# Patient Record
Sex: Female | Born: 1960
Health system: Southern US, Community
[De-identification: ages and names within clinical notes are randomized; demographics above are authoritative.]

## PROBLEM LIST (undated history)

## (undated) DIAGNOSIS — I10 Essential (primary) hypertension: Secondary | ICD-10-CM

## (undated) DIAGNOSIS — Z9889 Other specified postprocedural states: Secondary | ICD-10-CM

## (undated) DIAGNOSIS — D352 Benign neoplasm of pituitary gland: Secondary | ICD-10-CM

## (undated) DIAGNOSIS — R112 Nausea with vomiting, unspecified: Secondary | ICD-10-CM

## (undated) DIAGNOSIS — N979 Female infertility, unspecified: Secondary | ICD-10-CM

## (undated) DIAGNOSIS — Z85828 Personal history of other malignant neoplasm of skin: Secondary | ICD-10-CM

## (undated) DIAGNOSIS — E559 Vitamin D deficiency, unspecified: Secondary | ICD-10-CM

## (undated) DIAGNOSIS — T7840XA Allergy, unspecified, initial encounter: Secondary | ICD-10-CM

## (undated) HISTORY — DX: Benign neoplasm of pituitary gland: D35.2

## (undated) HISTORY — DX: Nausea with vomiting, unspecified: Z98.890

## (undated) HISTORY — PX: LAPAROSCOPY: SHX197

## (undated) HISTORY — DX: Allergy, unspecified, initial encounter: T78.40XA

## (undated) HISTORY — DX: Essential (primary) hypertension: I10

## (undated) HISTORY — DX: Personal history of other malignant neoplasm of skin: Z85.828

## (undated) HISTORY — PX: OTHER SURGICAL HISTORY: SHX169

## (undated) HISTORY — DX: Vitamin D deficiency, unspecified: E55.9

## (undated) HISTORY — PX: PLANTAR FASCIA SURGERY: SHX746

## (undated) HISTORY — DX: Other specified postprocedural states: R11.2

## (undated) HISTORY — PX: PLANTAR FASCIA RELEASE: SHX2239

## (undated) HISTORY — PX: COLONOSCOPY: SHX174

## (undated) HISTORY — DX: Female infertility, unspecified: N97.9

---

## 2013-11-17 ENCOUNTER — Emergency Department (HOSPITAL_BASED_OUTPATIENT_CLINIC_OR_DEPARTMENT_OTHER): Payer: Managed Care, Other (non HMO)

## 2013-11-17 ENCOUNTER — Encounter (HOSPITAL_BASED_OUTPATIENT_CLINIC_OR_DEPARTMENT_OTHER): Payer: Self-pay | Admitting: Emergency Medicine

## 2013-11-17 ENCOUNTER — Emergency Department (HOSPITAL_BASED_OUTPATIENT_CLINIC_OR_DEPARTMENT_OTHER)
Admission: EM | Admit: 2013-11-17 | Discharge: 2013-11-17 | Disposition: A | Discharge status: Home / Self Care | Payer: Managed Care, Other (non HMO) | Attending: Emergency Medicine | Admitting: Emergency Medicine

## 2013-11-17 DIAGNOSIS — Y9373 Activity, racquet and hand sports: Secondary | ICD-10-CM | POA: Insufficient documentation

## 2013-11-17 DIAGNOSIS — IMO0002 Reserved for concepts with insufficient information to code with codable children: Secondary | ICD-10-CM | POA: Diagnosis present

## 2013-11-17 DIAGNOSIS — Y9239 Other specified sports and athletic area as the place of occurrence of the external cause: Secondary | ICD-10-CM | POA: Diagnosis not present

## 2013-11-17 DIAGNOSIS — W010XXA Fall on same level from slipping, tripping and stumbling without subsequent striking against object, initial encounter: Secondary | ICD-10-CM | POA: Insufficient documentation

## 2013-11-17 DIAGNOSIS — Y92838 Other recreation area as the place of occurrence of the external cause: Secondary | ICD-10-CM

## 2013-11-17 DIAGNOSIS — S335XXA Sprain of ligaments of lumbar spine, initial encounter: Secondary | ICD-10-CM | POA: Insufficient documentation

## 2013-11-17 DIAGNOSIS — S39012A Strain of muscle, fascia and tendon of lower back, initial encounter: Secondary | ICD-10-CM

## 2013-11-17 MED ORDER — ONDANSETRON HCL 4 MG PO TABS: 4.0000 mg | ORAL_TABLET | Freq: Four times a day (QID) | ORAL | Status: DC

## 2013-11-17 MED ORDER — PREDNISONE 50 MG PO TABS
50.0000 mg | ORAL_TABLET | Freq: Every day | ORAL | Status: DC
  Administered 2013-11-17: 50 mg via ORAL
  Filled 2013-11-17: qty 1

## 2013-11-17 MED ORDER — DIAZEPAM 5 MG PO TABS: 5.0000 mg | ORAL_TABLET | Freq: Two times a day (BID) | ORAL | Status: DC

## 2013-11-17 MED ORDER — PREDNISONE 50 MG PO TABS: ORAL_TABLET | ORAL | Status: DC

## 2013-11-17 MED ORDER — ONDANSETRON 4 MG PO TBDP
4.0000 mg | ORAL_TABLET | Freq: Once | ORAL | Status: AC
  Administered 2013-11-17: 4 mg via ORAL
  Filled 2013-11-17: qty 1

## 2013-11-17 MED ORDER — DIAZEPAM 5 MG PO TABS
5.0000 mg | ORAL_TABLET | Freq: Once | ORAL | Status: AC
  Administered 2013-11-17: 5 mg via ORAL
  Filled 2013-11-17: qty 1

## 2013-11-17 MED ORDER — MORPHINE SULFATE 4 MG/ML IJ SOLN
4.0000 mg | Freq: Once | INTRAMUSCULAR | Status: AC
  Administered 2013-11-17: 4 mg via INTRAMUSCULAR
  Filled 2013-11-17: qty 1

## 2013-11-17 MED ORDER — OXYCODONE-ACETAMINOPHEN 5-325 MG PO TABS: ORAL_TABLET | ORAL | Status: DC

## 2013-11-17 NOTE — Discharge Instructions (Signed)
Please take ibuprofen 400mg  (this is normally 2 over the counter pills) every 6 hours (take with food to minimze stomach irritation).   Take valium and/or percocet for breakthrough pain, do not drink alcohol, drive, care for children or perfom other critical tasks while taking valium and/or percocet.  Do not hesitate to return to the emergency room for any new, worsening or concerning symptoms.  Please obtain primary care using resource guide below. But the minute you were seen in the emergency room and that they will need to obtain records for further outpatient management.   Back Pain, Adult Low back pain is very common. About 1 in 5 people have back pain.The cause of low back pain is rarely dangerous. The pain often gets better over time.About half of people with a sudden onset of back pain feel better in just 2 weeks. About 8 in 10 people feel better by 6 weeks.  CAUSES Some common causes of back pain include:  Strain of the muscles or ligaments supporting the spine.  Wear and tear (degeneration) of the spinal discs.  Arthritis.  Direct injury to the back. DIAGNOSIS Most of the time, the direct cause of low back pain is not known.However, back pain can be treated effectively even when the exact cause of the pain is unknown.Answering your caregiver's questions about your overall health and symptoms is one of the most accurate ways to make sure the cause of your pain is not dangerous. If your caregiver needs more information, he or she may order lab work or imaging tests (X-rays or MRIs).However, even if imaging tests show changes in your back, this usually does not require surgery. HOME CARE INSTRUCTIONS For many people, back pain returns.Since low back pain is rarely dangerous, it is often a condition that people can learn to [facility] their own.   Remain active. It is stressful on the back to sit or stand in one place. Do not sit, drive, or stand in one place for more than 30  minutes at a time. Take short walks on level surfaces as soon as pain allows.Try to increase the length of time you walk each day.  Do not stay in bed.Resting more than 1 or 2 days can delay your recovery.  Do not avoid exercise or work.Your body is made to move.It is not dangerous to be active, even though your back may hurt.Your back will likely heal faster if you return to being active before your pain is gone.  Pay attention to your body when you bend and lift. Many people have less discomfortwhen lifting if they bend their knees, keep the load close to their bodies,and avoid twisting. Often, the most comfortable positions are those that put less stress on your recovering back.  Find a comfortable position to sleep. Use a firm mattress and lie on your side with your knees slightly bent. If you lie on your back, put a pillow under your knees.  Only take over-the-counter or prescription medicines as directed by your caregiver. Over-the-counter medicines to reduce pain and inflammation are often the most helpful.Your caregiver may prescribe muscle relaxant drugs.These medicines help dull your pain so you can more quickly return to your normal activities and healthy exercise.  Put ice on the injured area.  Put ice in a plastic bag.  Place a towel between your skin and the bag.  Leave the ice on for 15-20 minutes, 03-04 times a day for the first 2 to 3 days. After that, ice and heat  may be alternated to reduce pain and spasms.  Ask your caregiver about trying back exercises and gentle massage. This may be of some benefit.  Avoid feeling anxious or stressed.Stress increases muscle tension and can worsen back pain.It is important to recognize when you are anxious or stressed and learn ways to manage it.Exercise is a great option. SEEK MEDICAL CARE IF:  You have pain that is not relieved with rest or medicine.  You have pain that does not improve in 1 week.  You have new  symptoms.  You are generally not feeling well. SEEK IMMEDIATE MEDICAL CARE IF:   You have pain that radiates from your back into your legs.  You develop new bowel or bladder control problems.  You have unusual weakness or numbness in your arms or legs.  You develop nausea or vomiting.  You develop abdominal pain.  You feel faint. Document Released: 02/08/2005 Document Revised: 08/10/2011 Document Reviewed: 06/12/2013 [facility] Patient Information 2015 [place], [place]. This information is not intended to replace advice given to you by your health care provider. Make sure you discuss any questions you have with your health care provider.  Emergency Department Resource Guide 1) Find a Doctor and Pay Out of Pocket Although you won't have to find out who is covered by your insurance plan, it is a good idea to ask around and get recommendations. You will then need to call the office and see if the doctor you have chosen will accept you as a new patient and what types of options they offer for patients who are self-pay. Some doctors offer discounts or will set up payment plans for their patients who do not have insurance, but you will need to ask so you aren't surprised when you get to your appointment.  2) Contact Your Local Health Department Not all health departments have doctors that can see patients for sick visits, but many do, so it is worth a call to see if yours does. If you don't know where your local health department is, you can check in your phone book. The CDC also has a tool to help you locate your state's health department, and many state websites also have listings of all of their local health departments.  3) Find a [facility] If your illness is not likely to be very severe or complicated, you may want to try a walk in clinic. These are popping up all over the country in pharmacies, drugstores, and shopping centers. They're usually staffed by nurse practitioners or physician  assistants that have been trained to treat common illnesses and complaints. They're usually fairly quick and inexpensive. However, if you have serious medical issues or chronic medical problems, these are probably not your best option.  No Primary Care Doctor: - Call Health Connect at  [phone] - they can help you locate a primary care doctor that  accepts your insurance, provides certain services, etc. - Physician Referral Service- [phone]  Chronic Pain Problems: Organization         Address  Phone   Notes  [facility]  [phone] Patients need to be referred by their primary care doctor.   Medication Assistance: Organization         Address  Phone   Notes  [facility] Medication [facility] [address] [place], [place] [address] [phone] --Must be a resident of [facility] -- Must have NO insurance coverage whatsoever (no Medicaid/ Medicare, etc.) -- The pt.  MUST have a primary care doctor that directs their care regularly and follows them in the community   MedAssist  [phone]   [employer]  [phone]    Agencies that provide inexpensive medical care: Organization         Address  Phone   Notes  [place]  [phone]   [facility]    [phone]   [facility] [place], [place] [address] [phone]   [place] [address] [place] [phone]   Planned Parenthood    [phone]   [facility]    [phone]   [place] and [place] [address], [place] Phone:  [phone], Fax:  [phone] Hours of Operation:  9 am - 6 pm, M-F.  Also accepts Medicaid/Medicare and self-pay.  [facility] for [place], [address], [place] Phone: [phone], Fax: [phone]. Hours of Operation:  8:30 am - 5:30 pm, M-F.  Also accepts  Medicaid and self-pay.  [facility] [place] [address] [place] Phone: [phone]   [place], [place], [place] [phone], Ext. [phone] Mondays & Thursdays: 7-9 AM.  First 15 patients are seen on a first come, first serve basis.    [place] Providers:  Organization         Address  Phone   Notes  [facility] [address], [place] [phone] Also accepts self-pay patients.  [facility] [address], [place]  [phone]   [place], [address], [place] [phone]   [facility] [address] [place] [phone]   Lucianne Lei [address], [place]   [phone] Only accepts [place] Access [place] patients after they have their name applied to their card.   Self-Pay (no insurance) in [facility]:  Organization         Address  Phone   Notes  Sickle Cell Patients, [facility] [place] [phone]   [facility] [place] [phone]   [facility] [place]  [place], [place],  [phone]   [facility]/Dr. Osei-Bonsu  [address] [place] or [address], [place] [phone] Phone number for both Central and [place] locations is the same.  Urgent Medical and [facility] [address], [place] [phone]   [facility] [address], [place] or [address] [phone]   [facility] [address] [place] [phone], phone; [phone], fax Sees patients 1st and 3rd Saturday of every month.  Must not qualify for public or private insurance (i.e. Medicaid, Medicare, [place] Health Choice, Veterans' Benefits)  Household  income should be no more than 200% of the poverty level The clinic cannot treat you if you are pregnant or think you are pregnant  Sexually transmitted diseases are not treated at the clinic.    Dental Care: Organization         Address  Phone  Notes  [facility] [address]  Mardene Speak [phone] Accepts children up to age 64 who are enrolled in Medicaid or [place]; pregnant women with a Medicaid card; and children who have applied for Medicaid or [place] Health Choice, but were declined, whose parents can pay a reduced fee at time of service.  [facility]  [address], [place] [phone] Accepts children up to age 12 who are enrolled in [place] or [place]; pregnant women with a Medicaid card; and children who have applied for Medicaid or [place] Health Choice, but were declined, whose parents can pay a reduced fee at time of service.  [place] Adult Dental Access PROGRAM  [place] [phone] Patients are seen by appointment only. Walk-ins are not accepted. [place] will see patients 6 years of age and older. Monday - Tuesday (8am-5pm) Most Wednesdays (8:30-5pm) $30 per visit, cash only  [place] Adult Dental Access PROGRAM  [address], [facility] [phone] Patients are seen by appointment only. Walk-ins are not accepted. [place] will see patients 107 years of age and older. One Wednesday Evening (Monthly: Volunteer Based).  $30 per visit, cash only  [place]  [phone] for adults; Children under age 32, call Graduate Pediatric Dentistry at [phone]. Children aged 76-14, please call [phone] to request a pediatric application.  Dental services are provided in all areas of dental care including fillings, crowns and bridges, complete and partial dentures, implants, gum  treatment, root canals, and extractions. Preventive care is also provided. Treatment is provided to both adults and children. Patients are selected via a lottery and there is often a waiting list.   [facility] [address] [place]  [phone] [email or web address]   [facility] [address] [place], [place] [phone], Ext. [phone] Second and Fourth Thursday of each month, opens at 6:30 AM; Clinic ends at 9 AM.  Patients are seen on a first-come first-served basis, and a limited number are seen during each clinic.   [facility]  [address] [place], [place] [phone]   Eligibility Requirements You must have lived in [place], [place], or [place] counties for at least the last three months.   You cannot be eligible for state or federal sponsored [employer], including [employer], [place], or [employer].   You generally cannot be eligible for healthcare insurance through your employer.    How to apply: Eligibility screenings are held every Tuesday and Wednesday afternoon from 1:00 pm until 4:00 pm. You do not need an appointment for the interview!  [facility] [address], [place], [place]   [place]  [place] Department  [place]  [phone]    Behavioral Health Resources in the Community: Intensive Outpatient Programs Organization         Address  Phone  Notes  [place]. [address] [place], [place] [phone]   [facility] Outpatient [address] [place], [place]   ADS: Alcohol & Drug Svcs [address], [place], [place]   [place] [address],  [place], [place] or [phone]   Substance Abuse Resources Organization         Address  Phone  Notes  Alcohol and  Drug  Services  [place]  [phone]   [facility] [place]   Chinita Pester  [phone]   Residential & Outpatient Substance Abuse Program  [phone]   Psychological Services Organization         Address  Phone  Notes  [place]  [place]  [phone]   [place] [address] [place] or [phone]    Mobile Crisis Teams Organization         Address  Phone  Notes  [facility], Mobile Crisis Care Unit  [phone]   [facility]  [address] [place], [place]   [place] Levels [address], [place] [phone]    Self-Help/Support Groups Organization         Address  Phone             Notes  [place]. of [place] - variety of support groups  [place] Call for more information  Narcotics Anonymous (NA), [facility] [address], [employer] [place]  2 meetings at this location   Special educational needs teacher         Address  Phone  Notes  [facility] [place],    [place]  [phone]   [facility]  [address] [place] [phone], [place], [place]   [place], [place] [phone] Admissions: 8am-3pm M-F  Incentives Substance [place] [address]    [place], [place] [phone]   [facility] [address] [place], [place], [place]   [facility] [address]  [place], [place]   [facility] - Intensive Outpatient [address], [place], [place]   [facility] ([place].,  [place], [place] [phone] or [phone]   [facility] Services (RTS) [address] [place], [place] Accepts Medicaid  Fellowship  [place] [address],  [place] [phone] Substance Abuse/Addiction Treatment   [facility] Organization         Address  Phone  Notes  [facility]  [phone]   Domenic Schwab, PhD [address] Arlis [place], [place]   [phone] or [phone]   [place], [place] [phone]   [facility] [address] [place], [place] [phone] Insurance/Medicaid/sponsorship through [facility] and Families [address], [address] [place]                                    [place], [place] [phone] [place] [address] [place], [place] [phone]    Dr. Adele Schilder  [phone]   [facility] Dept. 1) [address] [place] 2) [place] 3)  [address], [place] [phone]  [phone]   [place] [phone] or [phone] (After Hours)

## 2013-11-17 NOTE — ED Provider Notes (Signed)
CSN: [id number]     Arrival date & time 11/17/13  1149 History   First MD Initiated Contact with Patient 11/17/13 1205     Chief Complaint  Patient presents with  . Fall  . Back Pain     (Consider location/radiation/quality/duration/timing/severity/associated sxs/prior Treatment) HPI  Stacy Preston is a 53 y.o. female complaining of low back pain s/p slip and fall while on wooden racquetball court. Pain is severe 9/10, diffuse, non-radiating across the lower back and exacarbated by movement and palpation. No pain meds PTA, No h/o low back pain. Pt denies any head trauma/cervicalgia or other injuries in the fall. Denies fever, chills, change in bowel or bladder habits, h/o IDVU or cancer, numbness or weakness.    History reviewed. No pertinent past medical history. History reviewed. No pertinent past surgical history. No family history on file. History  Substance Use Topics  . Smoking status: Never Smoker   . Smokeless tobacco: Not on file  . Alcohol Use: Not on file   OB History   Grav Para Term Preterm Abortions TAB SAB Ect Mult Living                 Review of Systems  10 systems reviewed and found to be negative, except as noted in the HPI.   Allergies  Review of patient's allergies indicates no known allergies.  Home Medications   Prior to Admission medications   Medication Sig Start Date End Date Taking? Authorizing Provider  cholecalciferol (VITAMIN D) 1000 UNITS tablet Take 2,000 Units by mouth daily.   Yes Historical Provider, MD  diazepam (VALIUM) 5 MG tablet Take 1 tablet (5 mg total) by mouth 2 (two) times daily. 11/17/13   Lashana Spang, PA-C  ondansetron (ZOFRAN) 4 MG tablet Take 1 tablet (4 mg total) by mouth every 6 (six) hours. 11/17/13   Akaylah Lalley, PA-C  oxyCODONE-acetaminophen (PERCOCET/ROXICET) 5-325 MG per tablet 1 to 2 tabs PO q6hrs  PRN for pain 11/17/13   Elmyra Ricks Nitya Cauthon, PA-C  predniSONE (DELTASONE) 50 MG tablet Take 1 tablet daily with  breakfast 11/17/13   Gem Conkle, PA-C   BP 123/69  Pulse 70  Temp(Src) 98.7 F (37.1 C) (Oral)  Resp 18  Ht 5\' 3"  (1.6 m)  Wt 185 lb (83.915 kg)  BMI 32.78 kg/m2  SpO2 100% Physical Exam  Nursing note and vitals reviewed. Constitutional: She appears well-developed and well-nourished.  Tearful, standing and avoids movement  HENT:  Head: Normocephalic.  Eyes: Conjunctivae are normal.  Neck: Normal range of motion.  Cardiovascular: Normal rate, regular rhythm and intact distal pulses.   Pulmonary/Chest: Effort normal and breath sounds normal.  Abdominal: Soft. Bowel sounds are normal. There is no tenderness.  Neurological: She is alert.  No overlying skin changes or bruises, no coccyx or sacral pain. No point tenderness to percussion of lumbar spinal processes or sacrum.  Diffusely TTP on bilateral lumbar paraspinal musculature. Strength is 5 out of 5 to bilateral lower extremities at hip and knee; extensor hallucis longus 5 out of 5. Ankle strength 5 out of 5, no clonus, neurovascularly intact. No saddle anaesthesia. Patellar reflexes are 2+ bilaterally.   Ambulates with coordinated gate   Psychiatric: She has a normal mood and affect.    ED Course  Procedures (including critical care time) Labs Review Labs Reviewed - No data to display  Imaging Review Dg Lumbar Spine Complete  11/17/2013   CLINICAL DATA:  [address] backwards onto back playing racquetball, low back pain  EXAM: LUMBAR SPINE - COMPLETE 4+ VIEW  COMPARISON:  None  FINDINGS: Five non-rib-bearing lumbar vertebrae.  Disc space narrowing with endplate spur formation at L4-L5.  Additional scattered mild disc space narrowing at multiple levels.  Vertebral body heights maintained without fracture or subluxation.  No bone destruction or spondylolysis.  SI joints symmetric.  IMPRESSION: Scattered degenerative disc disease changes greatest at L4-L5.  No acute abnormalities.   Electronically Signed   By: Lavonia Dana M.D.   On:  11/17/2013 13:47     EKG Interpretation None      MDM   Final diagnoses:  Lumbar strain, initial encounter    Filed Vitals:   11/17/13 1153 11/17/13 1411  BP: 162/78 123/69  Pulse: 91 70  Temp: 98.7 F (37.1 C)   TempSrc: Oral   Resp: 18 18  Height: 5\' 3"  (1.6 m)   Weight: 185 lb (83.915 kg)   SpO2: 100% 100%    Medications  diazepam (VALIUM) tablet 5 mg (5 mg Oral Given 11/17/13 1237)  morphine 4 MG/ML injection 4 mg (4 mg Intramuscular Given 11/17/13 1235)  ondansetron (ZOFRAN-ODT) disintegrating tablet 4 mg (4 mg Oral Given 11/17/13 1245)    Stacy Preston is a 53 y.o. female presenting with low back pain s/p slip and fall on hardwood floor from standing height. No neuro deficits. X-ray negative. Pain well-controlled in the ED.  Evaluation does not show pathology that would require ongoing emergent intervention or inpatient treatment. Pt is hemodynamically stable and mentating appropriately. Discussed findings and plan with patient/guardian, who agrees with care plan. All questions answered. Return precautions discussed and outpatient follow up given.   Discharge Medication List as of 11/17/2013  1:52 PM    START taking these medications   Details  diazepam (VALIUM) 5 MG tablet Take 1 tablet (5 mg total) by mouth 2 (two) times daily., Starting 11/17/2013, Until Discontinued, Print    ondansetron (ZOFRAN) 4 MG tablet Take 1 tablet (4 mg total) by mouth every 6 (six) hours., Starting 11/17/2013, Until Discontinued, Print    oxyCODONE-acetaminophen (PERCOCET/ROXICET) 5-325 MG per tablet 1 to 2 tabs PO q6hrs  PRN for pain, Print    predniSONE (DELTASONE) 50 MG tablet Take 1 tablet daily with breakfast, Print             Monico Blitz, PA-C 11/18/13 0120

## 2013-11-17 NOTE — ED Notes (Signed)
Pt was playing racket ball when she fell backwards landing on her tailbone

## 2013-11-18 NOTE — ED Provider Notes (Signed)
Medical screening examination/treatment/procedure(s) were performed by non-physician practitioner and as supervising physician I was immediately available for consultation/collaboration.   EKG Interpretation None        Merryl Hacker, MD 11/18/13 [phone]

## 2013-11-26 LAB — HM PAP SMEAR: HM Pap smear: NEGATIVE

## 2014-02-28 ENCOUNTER — Other Ambulatory Visit: Payer: Self-pay | Admitting: Obstetrics & Gynecology

## 2014-02-28 DIAGNOSIS — N63 Unspecified lump in unspecified breast: Secondary | ICD-10-CM

## 2014-03-19 ENCOUNTER — Other Ambulatory Visit: Payer: Managed Care, Other (non HMO)

## 2014-05-02 ENCOUNTER — Ambulatory Visit
Admission: RE | Admit: 2014-05-02 | Discharge: 2014-05-02 | Disposition: A | Discharge status: Home / Self Care | Payer: Managed Care, Other (non HMO) | Source: Ambulatory Visit | Attending: Obstetrics & Gynecology | Admitting: Obstetrics & Gynecology

## 2014-05-02 ENCOUNTER — Other Ambulatory Visit: Payer: Self-pay

## 2014-05-02 DIAGNOSIS — Z1231 Encounter for screening mammogram for malignant neoplasm of breast: Secondary | ICD-10-CM

## 2014-05-02 DIAGNOSIS — N63 Unspecified lump in unspecified breast: Secondary | ICD-10-CM

## 2014-10-24 ENCOUNTER — Ambulatory Visit
Admission: RE | Admit: 2014-10-24 | Discharge: 2014-10-24 | Disposition: A | Discharge status: Home / Self Care | Payer: Managed Care, Other (non HMO) | Source: Ambulatory Visit

## 2014-10-24 DIAGNOSIS — Z1231 Encounter for screening mammogram for malignant neoplasm of breast: Secondary | ICD-10-CM

## 2015-03-06 LAB — HM COLONOSCOPY

## 2015-09-04 ENCOUNTER — Emergency Department
Admission: EM | Admit: 2015-09-04 | Discharge: 2015-09-04 | Disposition: A | Discharge status: Home / Self Care | Payer: Managed Care, Other (non HMO) | Source: Home / Self Care | Attending: Family Medicine | Admitting: Family Medicine

## 2015-09-04 ENCOUNTER — Encounter: Payer: Self-pay | Admitting: *Deleted

## 2015-09-04 ENCOUNTER — Emergency Department (INDEPENDENT_AMBULATORY_CARE_PROVIDER_SITE_OTHER): Payer: Managed Care, Other (non HMO)

## 2015-09-04 DIAGNOSIS — S5012XA Contusion of left forearm, initial encounter: Secondary | ICD-10-CM

## 2015-09-04 DIAGNOSIS — M79632 Pain in left forearm: Secondary | ICD-10-CM | POA: Diagnosis not present

## 2015-09-04 NOTE — ED Provider Notes (Signed)
CSN: [id number]     Arrival date & time 09/04/15  0840 History   First MD Initiated Contact with Patient 09/04/15 0911     Chief Complaint  Patient presents with  . Arm Injury      HPI Comments: Patient was preparing to walk her dog last night with a retractable leash in her left hand when her dog suddenly bolted.  Her left forearm then struck a door frame.  She has had persistent pain/swelling in her left forearm despite wearing a left wrist brace and icing.  She denies pain in her left wrist, elbow, or shoulder.  Patient is a 55 y.o. female presenting with arm injury. The history is provided by the patient.  Arm Injury Location:  Arm Time since incident:  1 day Injury: yes   Mechanism of injury comment:  Contusion by door frame Arm location:  L forearm Pain details:    Quality:  Aching   Radiates to:  Does not radiate   Severity:  Moderate   Onset quality:  Sudden   Duration:  1 day   Timing:  Constant   Progression:  Unchanged Chronicity:  New Handedness:  Right-handed Dislocation: no   Prior injury to area:  No Relieved by:  Nothing Worsened by:  Movement Ineffective treatments:  Ice Associated symptoms: stiffness and swelling   Associated symptoms: no decreased range of motion, no muscle weakness, no numbness and no tingling     History reviewed. No pertinent past medical history. Past Surgical History  Procedure Laterality Date  . Plantar fascia surgery     Family History  Problem Relation Age of Onset  . Renal Disease Mother   . Cancer Father     Lung CA   Social History  Substance Use Topics  . Smoking status: Never Smoker   . Smokeless tobacco: Never Used  . Alcohol Use: Yes   OB History    No data available     Review of Systems  Musculoskeletal: Positive for stiffness.  All other systems reviewed and are negative.   Allergies  Lactose intolerance (gi)  Home Medications   Prior to Admission medications   Medication Sig Start Date End Date  Taking? Authorizing Provider  estradiol (ESTRACE) 0.1 MG/GM vaginal cream Place 1 Applicatorful vaginally at bedtime.   Yes Historical Provider, MD  MELATONIN ER PO Take by mouth.   Yes Historical Provider, MD  cholecalciferol (VITAMIN D) 1000 UNITS tablet Take 2,000 Units by mouth daily.    Historical Provider, MD   Meds Ordered and Administered this Visit  Medications - No data to display  BP 118/77 mmHg  Pulse 74  Ht 5\' 3"  (1.6 m)  Wt 181 lb (82.101 kg)  BMI 32.07 kg/m2  SpO2 99% No data found.   Physical Exam  Constitutional: She is oriented to person, place, and time. She appears well-developed and well-nourished.  HENT:  Head: Atraumatic.  Eyes: Pupils are equal, round, and reactive to light.  Pulmonary/Chest: Effort normal.  Musculoskeletal:       Left forearm: She exhibits tenderness, bony tenderness and swelling. She exhibits no deformity and no laceration.       Arms: Left forearm has tenderness to palpation dorsally as noted on diagram.  Elbow and wrist have full range of motion.  Tenderness extends to lateral epicondyle.  Distal neurovascular function is intact.     Neurological: She is alert and oriented to person, place, and time.  Skin: Skin is warm and dry. No  rash noted.  Nursing note and vitals reviewed.   ED Course  Procedures none  Imaging Review Dg Forearm Left  09/04/2015  CLINICAL DATA:  Head left arm on door frame last night. Posterior forearm pain. EXAM: LEFT FOREARM - 2 VIEW COMPARISON:  None. FINDINGS: There is no evidence of fracture or other focal bone lesions. Soft tissues are unremarkable. IMPRESSION: Negative. Electronically Signed   By: Misty Stanley M.D.   On: 09/04/2015 09:36      MDM   1. Contusion of left forearm, initial encounter    Apply ice pack for 30 minutes every 2 to 3 hours today and tomorrow.  Elevate.   May wear wrist brace for about one week.  Begin range of motion and stretching exercises in about 3 to 5 days as per  instruction sheet.  May take Ibuprofen 200mg , 4 tabs every 8 hours with food.  Followup with Dr. Aundria Mems or Dr. [name] ([facility]) if not improving about two weeks.      Kandra Nicolas, MD 09/04/15 1010

## 2015-09-04 NOTE — Discharge Instructions (Signed)
Apply ice pack for 20 minutes every 2 to 3 hours today and tomorrow.  Elevate.   May wear wrist brace for about one week.  Begin range of motion and stretching exercises in about 3 to 5 days as per instruction sheet.  May take Ibuprofen 200mg , 4 tabs every 8 hours with food.     Contusion A contusion is a deep bruise. Contusions are the result of a blunt injury to tissues and muscle fibers under the skin. The injury causes bleeding under the skin. The skin overlying the contusion may turn blue, purple, or yellow. Minor injuries will give you a painless contusion, but more severe contusions may stay painful and swollen for a few weeks.  CAUSES  This condition is usually caused by a blow, trauma, or direct force to an area of the body. SYMPTOMS  Symptoms of this condition include:  Swelling of the injured area.  Pain and tenderness in the injured area.  Discoloration. The area may have redness and then turn blue, purple, or yellow. DIAGNOSIS  This condition is diagnosed based on a physical exam and medical history. An X-ray, CT scan, or MRI may be needed to determine if there are any associated injuries, such as broken bones (fractures). TREATMENT  Specific treatment for this condition depends on what area of the body was injured. In general, the best treatment for a contusion is resting, icing, applying pressure to (compression), and elevating the injured area. This is often called the RICE strategy. Over-the-counter anti-inflammatory medicines may also be recommended for pain control.  HOME CARE INSTRUCTIONS   Rest the injured area.  If directed, apply ice to the injured area:  Put ice in a plastic bag.  Place a towel between your skin and the bag.  Leave the ice on for 20 minutes, 2-3 times per day.  If directed, apply light compression to the injured area using an elastic bandage. Make sure the bandage is not wrapped too tightly. Remove and reapply the bandage as directed by your  health care provider.  If possible, raise (elevate) the injured area above the level of your heart while you are sitting or lying down.  Take over-the-counter and prescription medicines only as told by your health care provider. SEEK MEDICAL CARE IF:  Your symptoms do not improve after several days of treatment.  Your symptoms get worse.  You have difficulty moving the injured area. SEEK IMMEDIATE MEDICAL CARE IF:   You have severe pain.  You have numbness in a hand or foot.  Your hand or foot turns pale or cold.   This information is not intended to replace advice given to you by your health care provider. Make sure you discuss any questions you have with your health care provider.   Document Released: 11/18/2004 Document Revised: 10/30/2014 Document Reviewed: 06/26/2014 Elsevier Interactive Patient Education [employer].

## 2015-09-04 NOTE — ED Notes (Signed)
Pt reports while holding her dogs leach going outside her left FA was jerked and pulled backwards against her doorframe. No previous injury to her forearm, previous fracture left wrist. Applied ice @ home and worse brace from wrist fx.

## 2015-10-06 ENCOUNTER — Other Ambulatory Visit: Payer: Self-pay | Admitting: Obstetrics & Gynecology

## 2015-10-06 DIAGNOSIS — Z1231 Encounter for screening mammogram for malignant neoplasm of breast: Secondary | ICD-10-CM

## 2015-10-30 ENCOUNTER — Ambulatory Visit
Admission: RE | Admit: 2015-10-30 | Discharge: 2015-10-30 | Disposition: A | Discharge status: Home / Self Care | Payer: Managed Care, Other (non HMO) | Source: Ambulatory Visit | Attending: Obstetrics & Gynecology | Admitting: Obstetrics & Gynecology

## 2015-10-30 DIAGNOSIS — Z1231 Encounter for screening mammogram for malignant neoplasm of breast: Secondary | ICD-10-CM

## 2016-01-09 ENCOUNTER — Encounter: Payer: Self-pay | Admitting: Family Medicine

## 2016-01-09 ENCOUNTER — Ambulatory Visit (INDEPENDENT_AMBULATORY_CARE_PROVIDER_SITE_OTHER): Payer: Managed Care, Other (non HMO) | Admitting: Family Medicine

## 2016-01-09 ENCOUNTER — Ambulatory Visit (INDEPENDENT_AMBULATORY_CARE_PROVIDER_SITE_OTHER): Payer: Managed Care, Other (non HMO)

## 2016-01-09 VITALS — BP 127/67 | HR 80 | Ht 63.0 in | Wt 185.0 lb

## 2016-01-09 DIAGNOSIS — Z114 Encounter for screening for human immunodeficiency virus [HIV]: Secondary | ICD-10-CM

## 2016-01-09 DIAGNOSIS — Z86018 Personal history of other benign neoplasm: Secondary | ICD-10-CM | POA: Diagnosis not present

## 2016-01-09 DIAGNOSIS — Z23 Encounter for immunization: Secondary | ICD-10-CM | POA: Diagnosis not present

## 2016-01-09 DIAGNOSIS — Z1159 Encounter for screening for other viral diseases: Secondary | ICD-10-CM

## 2016-01-09 DIAGNOSIS — M5010 Cervical disc disorder with radiculopathy, unspecified cervical region: Secondary | ICD-10-CM

## 2016-01-09 DIAGNOSIS — Z0000 Encounter for general adult medical examination without abnormal findings: Secondary | ICD-10-CM

## 2016-01-09 DIAGNOSIS — E559 Vitamin D deficiency, unspecified: Secondary | ICD-10-CM | POA: Diagnosis not present

## 2016-01-09 DIAGNOSIS — M50321 Other cervical disc degeneration at C4-C5 level: Secondary | ICD-10-CM | POA: Diagnosis not present

## 2016-01-09 DIAGNOSIS — R7982 Elevated C-reactive protein (CRP): Secondary | ICD-10-CM

## 2016-01-09 DIAGNOSIS — Z85828 Personal history of other malignant neoplasm of skin: Secondary | ICD-10-CM | POA: Diagnosis not present

## 2016-01-09 HISTORY — DX: Personal history of other malignant neoplasm of skin: Z85.828

## 2016-01-09 HISTORY — DX: Vitamin D deficiency, unspecified: E55.9

## 2016-01-09 LAB — CBC
HEMATOCRIT: 41.9 % (ref 35.0–45.0)
Hemoglobin: 13.8 g/dL (ref 11.7–15.5)
MCH: 29.3 pg (ref 27.0–33.0)
MCHC: 32.9 g/dL (ref 32.0–36.0)
MCV: 89 fL (ref 80.0–100.0)
MPV: 9.9 fL (ref 7.5–12.5)
PLATELETS: 294 10*3/uL (ref 140–400)
RBC: 4.71 MIL/uL (ref 3.80–5.10)
RDW: 13.5 % (ref 11.0–15.0)
WBC: 6.2 10*3/uL (ref 3.8–10.8)

## 2016-01-09 LAB — COMPLETE METABOLIC PANEL WITH GFR
ALT: 14 U/L (ref 6–29)
AST: 19 U/L (ref 10–35)
Albumin: 4.3 g/dL (ref 3.6–5.1)
Alkaline Phosphatase: 59 U/L (ref 33–130)
BUN: 22 mg/dL (ref 7–25)
CALCIUM: 9.2 mg/dL (ref 8.6–10.4)
CHLORIDE: 106 mmol/L (ref 98–110)
CO2: 28 mmol/L (ref 20–31)
CREATININE: 0.91 mg/dL (ref 0.50–1.05)
GFR, Est African American: 82 mL/min (ref >=60)
GFR, Est Non African American: 71 mL/min (ref >=60)
Glucose, Bld: 99 mg/dL (ref 65–99)
POTASSIUM: 4.3 mmol/L (ref 3.5–5.3)
SODIUM: 141 mmol/L (ref 135–146)
Total Bilirubin: 0.4 mg/dL (ref 0.2–1.2)
Total Protein: 7.3 g/dL (ref 6.1–8.1)

## 2016-01-09 LAB — LIPID PANEL
CHOL/HDL RATIO: 2.1 ratio (ref <5.0)
CHOLESTEROL: 163 mg/dL (ref <200)
HDL: 76 mg/dL (ref >50)
LDL Cholesterol: 75 mg/dL (ref <100)
TRIGLYCERIDES: 62 mg/dL (ref <150)
VLDL: 12 mg/dL (ref <30)

## 2016-01-09 LAB — HEMOGLOBIN A1C
Hgb A1c MFr Bld: 5.3 % (ref <5.7)
MEAN PLASMA GLUCOSE: 105 mg/dL

## 2016-01-09 NOTE — Progress Notes (Signed)
Stacy Preston is a 55 y.o. female who presents to [facility]: Primary Care Sports Medicine today for establish care and well adult visit.  Patient is doing well and is relatively new to the area. She has only one active medical problem. She has right arm radicular pain is worse with neck motion. This been present for about a month. She's been told that she has cervical degenerative disc disease in the past. She denies any weakness or numbness. Otherwise she feels well with no fevers chills nausea vomiting or diarrhea.    Past Medical History:  Diagnosis Date  . History of basal cell carcinoma 01/09/2016  . Prolactinoma (HCC)   . Vitamin D deficiency 01/09/2016   Past Surgical History:  Procedure Laterality Date  . ivf    . PLANTAR FASCIA RELEASE    . PLANTAR FASCIA SURGERY     Social History  Substance Use Topics  . Smoking status: Never Smoker  . Smokeless tobacco: Never Used  . Alcohol use Yes   family history includes Alcohol abuse in her brother; Cancer in her father; Renal Disease in her mother.  ROS as above:  Medications: Current Outpatient Prescriptions  Medication Sig Dispense Refill  . cholecalciferol (VITAMIN D) 1000 UNITS tablet Take 2,000 Units by mouth daily.    Marland Kitchen estradiol (ESTRACE) 0.1 MG/GM vaginal cream Place 1 Applicatorful vaginally at bedtime.    Marland Kitchen MELATONIN ER PO Take by mouth.     No current facility-administered medications for this visit.    Allergies  Allergen Reactions  . Lactose Intolerance (Gi)     Health Maintenance Health Maintenance  Topic Date Due  . Hepatitis C Screening  [birth year]  . HIV Screening  09/07/1974  . TETANUS/TDAP  09/07/1978  . PAP SMEAR  09/06/1980  . COLONOSCOPY  09/06/2009  . INFLUENZA VACCINE  09/23/2015  . MAMMOGRAM  10/23/2016     Exam:  BP 127/67   Pulse 80   Ht 5\' 3"  (1.6 m)   Wt 185 lb (83.9 kg)   BMI  32.77 kg/m  Gen: Well NAD HEENT: EOMI,  MMM Lungs: Normal work of breathing. CTABL Heart: RRR no MRG Abd: NABS, Soft. Nondistended, Nontender Exts: Brisk capillary refill, warm and well perfused.  C-spine: Nontender to spinal midline normal neck motion positive right-sided Spurling's test. Upper extremity strength is equal and normal throughout. Sensation is intact throughout. Normal arm motion.  No results found for this or any previous visit (from the past 72 hour(s)). No results found.    Assessment and Plan: 55 y.o. female with  Well adult. Doing well. Obtain basic fasting labs listed below. Tdap given. Will obtain records from previous physician's offices.  Cervical radiculopathy likely C6 or C7 right side. Plan for x-ray and physical therapy. Recheck in one month.   Orders Placed This Encounter  Procedures  . DG Cervical Spine Complete    Standing Status:   Future    Number of Occurrences:   1    Standing Expiration Date:   03/10/2017    Order Specific Question:   Reason for Exam (SYMPTOM  OR DIAGNOSIS REQUIRED)    Answer:   eval rt c6 or c7 radiculopathy    Order Specific Question:   Is patient pregnant?    Answer:   No    Order Specific Question:   Preferred imaging location?    Answer:   [facility]  . Tdap vaccine greater than or equal to  7yo IM  . CBC  . COMPLETE METABOLIC PANEL WITH GFR  . Hemoglobin A1c  . Hepatitis C antibody  . HIV antibody  . Lipid panel  . VITAMIN D 25 Hydroxy (Vit-D Deficiency, Fractures)  . Prolactin  . Ambulatory referral to Physical Therapy    Referral Priority:   Routine    Referral Type:   Physical Medicine    Referral Reason:   Specialty Services Required    Requested Specialty:   Physical Therapy    Number of Visits Requested:   1    Discussed warning signs or symptoms. Please see discharge instructions. Patient expresses understanding.

## 2016-01-09 NOTE — Patient Instructions (Signed)
Thank you for coming in today. Get labs today.  Attend PT.  Recheck in 1 month.  Get xray.    Cervical Radiculopathy Introduction Cervical radiculopathy happens when a nerve in the neck (cervical nerve) is pinched or bruised. This condition can develop because of an injury or as part of the normal aging process. Pressure on the cervical nerves can cause pain or numbness that runs from the neck all the way down into the arm and fingers. Usually, this condition gets better with rest. Treatment may be needed if the condition does not improve. What are the causes? This condition may be caused by:  Injury.  Slipped (herniated) disk.  Muscle tightness in the neck because of overuse.  Arthritis.  Breakdown or degeneration in the bones and joints of the spine (spondylosis) due to aging.  Bone spurs that may develop near the cervical nerves. What are the signs or symptoms? Symptoms of this condition include:  Pain that runs from the neck to the arm and hand. The pain can be severe or irritating. It may be worse when the neck is moved.  Numbness or weakness in the affected arm and hand. How is this diagnosed? This condition may be diagnosed based on symptoms, medical history, and a physical exam. You may also have tests, including:  X-rays.  CT scan.  MRI.  Electromyogram (EMG).  Nerve conduction tests. How is this treated? In many cases, treatment is not needed for this condition. With rest, the condition usually gets better over time. If treatment is needed, options may include:  Wearing a soft neck collar for short periods of time.  Physical therapy to strengthen your neck muscles.  Medicines, such as NSAIDs, oral corticosteroids, or spinal injections.  Surgery. This may be needed if other treatments do not help. Various types of surgery may be done depending on the cause of your problems. Follow these instructions at home: Managing pain  Take over-the-counter and  prescription medicines only as told by your health care provider.  If directed, apply ice to the affected area.  Put ice in a plastic bag.  Place a towel between your skin and the bag.  Leave the ice on for 20 minutes, 2-3 times per day.  If ice does not help, you can try using heat. Take a warm shower or warm bath, or use a heat pack as told by your health care provider.  Try a gentle neck and shoulder massage to help relieve symptoms. Activity  Rest as needed. Follow instructions from your health care provider about any restrictions on activities.  Do stretching and strengthening exercises as told by your health care provider or physical therapist. General instructions  If you were given a soft collar, wear it as told by your health care provider.  Use a flat pillow when you sleep.  Keep all follow-up visits as told by your health care provider. This is important. Contact a health care provider if:  Your condition does not improve with treatment. Get help right away if:  Your pain gets much worse and cannot be controlled with medicines.  You have weakness or numbness in your hand, arm, face, or leg.  You have a high fever.  You have a stiff, rigid neck.  You lose control of your bowels or your bladder (have incontinence).  You have trouble with walking, balance, or speaking. This information is not intended to replace advice given to you by your health care provider. Make sure you discuss any questions  you have with your health care provider. Document Released: 11/03/2000 Document Revised: 07/17/2015 Document Reviewed: 04/04/2014  2017 Elsevier

## 2016-01-10 LAB — VITAMIN D 25 HYDROXY (VIT D DEFICIENCY, FRACTURES): Vit D, 25-Hydroxy: 44 ng/mL (ref 30–100)

## 2016-01-10 LAB — HEPATITIS C ANTIBODY: HCV Ab: NEGATIVE

## 2016-01-10 LAB — HIV ANTIBODY (ROUTINE TESTING W REFLEX): HIV 1&2 Ab, 4th Generation: NONREACTIVE

## 2016-01-10 LAB — PROLACTIN: Prolactin: 5.3 ng/mL

## 2016-01-21 ENCOUNTER — Encounter: Payer: Self-pay | Admitting: Rehabilitative and Restorative Service Providers"

## 2016-01-21 ENCOUNTER — Ambulatory Visit (INDEPENDENT_AMBULATORY_CARE_PROVIDER_SITE_OTHER): Payer: Managed Care, Other (non HMO) | Admitting: Rehabilitative and Restorative Service Providers"

## 2016-01-21 DIAGNOSIS — M5412 Radiculopathy, cervical region: Secondary | ICD-10-CM

## 2016-01-21 DIAGNOSIS — R293 Abnormal posture: Secondary | ICD-10-CM

## 2016-01-21 DIAGNOSIS — R29898 Other symptoms and signs involving the musculoskeletal system: Secondary | ICD-10-CM

## 2016-01-21 NOTE — Patient Instructions (Signed)
Ergonomic changes to work station as discussed  Work on posture and alignment - lift chest allowing shoulders to drop down and back  Axial Extension (Chin Tuck)    Pull chin in and lengthen back of neck. Hold __10__ seconds while counting out loud. Repeat __10__ times. Do _several___ sessions per day.  Chin tuck lying on back 10 sec hold  5-10 reps 2-3 times/day   Shoulder Blade Squeeze   Can use swim noodle along spine to help with posture Rotate shoulders back, then squeeze shoulder blades down and back. Hold 10 sec Repeat _10___ times. Do __several__ sessions per day.   Scapula Adduction With Pectoralis Stretch: Low - Standing   Shoulders at 45 hands even with shoulders, keeping weight through legs, shift weight forward until you feel pull or stretch through the front of your chest. Hold _30__ seconds. Do _3__ times, _2-4__ times per day.   Scapula Adduction With Pectoralis Stretch: Mid-Range - Standing   Shoulders at 90 elbows even with shoulders, keeping weight through legs, shift weight forward until you feel pull or strength through the front of your chest. Hold __30_ seconds. Do _3__ times, __2-4_ times per day.   Scapula Adduction With Pectoralis Stretch: High - Standing   Shoulders at 120 hands up high on the doorway, keeping weight on feet, shift weight forward until you feel pull or stretch through the front of your chest. Hold _30__ seconds. Do _3__ times, _2-3__ times per day.   TENS UNIT: This is helpful for muscle pain and spasm.   Search and Purchase a TENS 7000 2nd edition at www.tenspros.com. It should be less than $30.     TENS unit instructions: Do not shower or bathe with the unit on Turn the unit off before removing electrodes or batteries If the electrodes lose stickiness add a drop of water to the electrodes after they are disconnected from the unit and place on plastic sheet. If you continued to have difficulty, call the TENS unit  company to purchase more electrodes. Do not apply lotion on the skin area prior to use. Make sure the skin is clean and dry as this will help prolong the life of the electrodes. After use, always check skin for unusual red areas, rash or other skin difficulties. If there are any skin problems, does not apply electrodes to the same area. Never remove the electrodes from the unit by pulling the wires. Do not use the TENS unit or electrodes other than as directed. Do not change electrode placement without consultating your therapist or physician. Keep 2 fingers with between each electrode.

## 2016-01-21 NOTE — Therapy (Signed)
Rt UE radicular symptoms (on a daily  basis). She has poor posture and alignment; limited ROM and soft tissue mobility; muscular tightness through the Rt upper quarter. She has poor ergonomic positioning at work per her report.    Rehab Potential Good   PT Frequency 2x / week   PT Duration 6 weeks   PT Treatment/Interventions Patient/family education;Neuromuscular re-education;Cryotherapy;Electrical Stimulation;Iontophoresis 4mg /ml Dexamethasone;Moist Heat;Traction;Ultrasound;Dry needling;Manual techniques;Therapeutic activities;Therapeutic exercise   PT Next Visit Plan continue with ergonomic and postural education; manual work through the cervical and pec musculature; progress exercise adding neural mob stretch supine; modalities as indicated    Consulted and Agree with Plan of Care Patient      Patient will benefit from skilled therapeutic intervention in order to improve the following deficits and impairments:  Postural dysfunction, Improper body mechanics, Pain, Decreased range of motion, Increased muscle spasms, Increased fascial restricitons, Decreased mobility, Decreased activity tolerance  Visit Diagnosis: Cervical radiculopathy - Plan: PT plan of care cert/re-cert  Abnormal posture - Plan: PT plan of care cert/re-cert  Other symptoms and signs involving the musculoskeletal system - Plan: PT plan of care cert/re-cert     Problem List Patient Active Problem List   Diagnosis Date Noted  . Vitamin D deficiency 01/09/2016  . History of prolactinoma 01/09/2016  . History of basal cell carcinoma 01/09/2016  . Cervical disc disorder with radiculopathy of cervical region 01/09/2016  . Elevated C-reactive protein (CRP) 01/09/2016    Justo Hengel Nilda Simmer PT, MPH  01/21/2016, 1:49 PM  [facility] [address] [place], [place], [address] Phone: [phone]   Fax:  [phone]  Name: Laretha Cornacchia MRN: [id number] Date of Birth: Jul 16, 1960  [place] [address] [place], [place], [address] Phone: [phone]   Fax:  [phone]  Physical Therapy Evaluation  Patient Details  Name: Stacy Preston MRN: [id number] Date of Birth: [birth year] Referring Provider: Dr Georgina Snell  Encounter Date: 01/21/2016      PT End of Session - 01/21/16 0948    Visit Number 1   Number of Visits 12   Date for PT Re-Evaluation 03/03/16   PT Start Time 0800   PT Stop Time 0903   PT Time Calculation (min) 63 min   Activity Tolerance Patient tolerated treatment well      Past Medical History:  Diagnosis Date  . History of basal cell carcinoma 01/09/2016  . Prolactinoma ([place])   . Vitamin D deficiency 01/09/2016    Past Surgical History:  Procedure Laterality Date  . ivf    . PLANTAR FASCIA RELEASE    . PLANTAR FASCIA SURGERY      There were no vitals filed for this visit.       Subjective Assessment - 01/21/16 0804    Subjective Becky reports that she has been having neck and hip pain since the summer. She was seen by chiropractor with good improvement. In the past 6-8 weeks she has noticed tingling and numbness in the Rt shoulder into the Rt hand on an intermittent basis - throughout the day. She has paiin in the Rt shoudler area.    Pertinent History Lt wrist fx ~ 2 years ago   Diagnostic tests xrays - DDD   Patient Stated Goals Get rid of the pain    Currently in Pain? Yes   Pain Score 8    Pain Location Shoulder   Pain Orientation Right   Pain Descriptors / Indicators Aching;Tingling;Numbness   Pain Type Acute pain   Pain Radiating Towards aching in the neck; numbness and tingling into the hand    Pain Onset More than a month ago   Pain Frequency Intermittent   Aggravating Factors  unknown; turning head to the Rt    Pain Relieving Factors time            [facility] PT Assessment - 01/21/16 0001      Assessment   Medical Diagnosis Cervical radiculopathy    Referring Provider Dr Georgina Snell   Onset Date/Surgical Date 11/17/15   Hand Dominance Right   Next MD Visit 12/17   Prior Therapy chiropractic care in the summer for ~ 3 months     Precautions   Precautions None     Balance Screen   Has the patient fallen in the past 6 months No   Has the patient had a decrease in activity level because of a fear of falling?  No   Is the patient reluctant to leave their home because of a fear of falling?  No     Prior Function   Level of Independence Independent   Vocation Full time employment   Vocation Requirements works at [employer] 8 hr/day 40 hr/wk - 30 + yrs   Leisure household chores; gym but has not been in several weeks ~ 3 days/wk - machines/treadmill ~ 1 hour      Observation/Other Assessments   Focus on Therapeutic Outcomes (FOTO)  34% limitation      Sensation   Additional Comments tingling and numbness Rt arm to hand on an intermittent basis     Posture/Postural Control   Posture Comments head forward; shoudlers rounded and elevated; increased thoracic  Rt UE radicular symptoms (on a daily  basis). She has poor posture and alignment; limited ROM and soft tissue mobility; muscular tightness through the Rt upper quarter. She has poor ergonomic positioning at work per her report.    Rehab Potential Good   PT Frequency 2x / week   PT Duration 6 weeks   PT Treatment/Interventions Patient/family education;Neuromuscular re-education;Cryotherapy;Electrical Stimulation;Iontophoresis 4mg /ml Dexamethasone;Moist Heat;Traction;Ultrasound;Dry needling;Manual techniques;Therapeutic activities;Therapeutic exercise   PT Next Visit Plan continue with ergonomic and postural education; manual work through the cervical and pec musculature; progress exercise adding neural mob stretch supine; modalities as indicated    Consulted and Agree with Plan of Care Patient      Patient will benefit from skilled therapeutic intervention in order to improve the following deficits and impairments:  Postural dysfunction, Improper body mechanics, Pain, Decreased range of motion, Increased muscle spasms, Increased fascial restricitons, Decreased mobility, Decreased activity tolerance  Visit Diagnosis: Cervical radiculopathy - Plan: PT plan of care cert/re-cert  Abnormal posture - Plan: PT plan of care cert/re-cert  Other symptoms and signs involving the musculoskeletal system - Plan: PT plan of care cert/re-cert     Problem List Patient Active Problem List   Diagnosis Date Noted  . Vitamin D deficiency 01/09/2016  . History of prolactinoma 01/09/2016  . History of basal cell carcinoma 01/09/2016  . Cervical disc disorder with radiculopathy of cervical region 01/09/2016  . Elevated C-reactive protein (CRP) 01/09/2016    Justo Hengel Nilda Simmer PT, MPH  01/21/2016, 1:49 PM  [facility] [place], [place], [address] Phone: [phone]   Fax:  [phone]  Name: Laretha Cornacchia MRN: [id number] Date of Birth: Jul 16, 1960

## 2016-01-22 ENCOUNTER — Encounter: Payer: Self-pay | Admitting: Family Medicine

## 2016-01-23 ENCOUNTER — Ambulatory Visit (INDEPENDENT_AMBULATORY_CARE_PROVIDER_SITE_OTHER): Payer: Managed Care, Other (non HMO) | Admitting: Rehabilitative and Restorative Service Providers"

## 2016-01-23 ENCOUNTER — Encounter: Payer: Self-pay | Admitting: Physical Therapy

## 2016-01-23 ENCOUNTER — Encounter: Payer: Self-pay | Admitting: Rehabilitative and Restorative Service Providers"

## 2016-01-23 DIAGNOSIS — R29898 Other symptoms and signs involving the musculoskeletal system: Secondary | ICD-10-CM

## 2016-01-23 DIAGNOSIS — R293 Abnormal posture: Secondary | ICD-10-CM | POA: Diagnosis not present

## 2016-01-23 DIAGNOSIS — M5412 Radiculopathy, cervical region: Secondary | ICD-10-CM

## 2016-01-23 NOTE — Therapy (Signed)
Stacy Preston, [place], [address] Phone: [phone]   Fax:  [phone]  Physical Therapy Treatment  Patient Details  Name: Stacy Preston MRN: [id number] Date of Birth: 10-30-1960 Referring Provider: Dr Georgina Snell  Encounter Date: 01/23/2016      PT End of Session - 01/23/16 1456    Visit Number 2   Number of Visits 12   Date for PT Re-Evaluation 03/03/16   PT Start Time [id number]   PT Stop Time [id number]   PT Time Calculation (min) 55 min   Activity Tolerance Patient tolerated treatment well      Past Medical History:  Diagnosis Date  . History of basal cell carcinoma 01/09/2016  . Prolactinoma ([place])   . Vitamin D deficiency 01/09/2016    Past Surgical History:  Procedure Laterality Date  . ivf    . PLANTAR FASCIA RELEASE    . PLANTAR FASCIA SURGERY      There were no vitals filed for this visit.      Subjective Assessment - 01/23/16 1456    Subjective No change in symptoms. Continued pain and dyscomfort in the Rt neck and shoulder area and into the Rt hand    Currently in Pain? Yes   Pain Score 8    Pain Location Shoulder   Pain Orientation Right   Pain Descriptors / Indicators Aching;Tingling;Numbness                         [facility] Adult PT Treatment/Exercise - 01/23/16 0001      Neck Exercises: Machines for Strengthening   UBE (Upper Arm Bike) L1 3 min alt fwd/back     Neck Exercises: Standing   Neck Retraction 10 reps;10 secs     Neck Exercises: Supine   Neck Retraction 10 secs;5 reps   Other Supine Exercise neural mob median nerve pathway 1 min x 2      Shoulder Exercises: Standing   Other Standing Exercises scap squeeze 10 sec x 10 w/noodle      Shoulder Exercises: Stretch   Other Shoulder Stretches 3 way doorway 30 sec x 3      Moist Heat Therapy   Number Minutes Moist Heat 20 Minutes   Moist Heat Location Cervical  thoracic     Electrical Stimulation    Electrical Stimulation Location Rt cervical/pec/arm area   Electrical Stimulation Action IFC   Electrical Stimulation Parameters to toleranc   Electrical Stimulation Goals Tone;Pain     Manual Therapy   Manual therapy comments pt supine    Joint Mobilization cervical PA and laterla mobs C 5/6/7/T1   Soft tissue mobilization Rt upper quarter > Lt    Myofascial Release Rt upper trap/lateral cervical    Passive ROM cervical flexion with slight lateral flexion - increase in Rt UE radicular symptoms with slight rotation to the Rt    Manual Traction manual cervical traction with 20-330 sec pulls throughout treatmet                 PT Education - 01/23/16 1541    Education provided Yes   Education Details HEP   Person(s) Educated Patient   Methods Explanation;Demonstration;Tactile cues;Verbal cues;Handout   Comprehension Verbalized understanding;Returned demonstration;Verbal cues required;Tactile cues required             PT Long Term Goals - 01/21/16 1342      PT LONG TERM GOAL #1   Title Improve posture  and alignment with patient to demonstrate more upright position of head and neck 03/03/16   Time 6   Period Weeks   Status New     PT LONG TERM GOAL #2   Title Improve radicular symptoms Rt UE with patient report minimal to no radicular symptoms 03/03/16   Time 6   Period Weeks   Status New     PT LONG TERM GOAL #3   Title Improve functional activities with patient to report sitting, standing and walking to 30-60 min without onset of symptoms 03/03/16   Time 6   Period Weeks   Status New     PT LONG TERM GOAL #4   Title Independent in HEP 03/03/16   Time 6   Period Weeks   Status New     PT LONG TERM GOAL #5   Title Improve FOTO to </= 26% limitation 03/03/16   Time 6   Period Weeks   Status New               Plan - 01/23/16 1542    Clinical Impression Statement Continued UE radicular symptoms. Good response to manual work with relaxation of  muscle tightness and increase in tissue extensibility.    Rehab Potential Good   PT Frequency 2x / week   PT Duration 6 weeks   PT Treatment/Interventions Patient/family education;Neuromuscular re-education;Cryotherapy;Electrical Stimulation;Iontophoresis 4mg /ml Dexamethasone;Moist Heat;Traction;Ultrasound;Dry needling;Manual techniques;Therapeutic activities;Therapeutic exercise   PT Next Visit Plan continue with ergonomic and postural education; manual work through the cervical and pec musculature; progress exercise adding neural mob stretch supine; modalities as indicated; trial of TDN   Consulted and Agree with Plan of Care Patient      Patient will benefit from skilled therapeutic intervention in order to improve the following deficits and impairments:  Postural dysfunction, Improper body mechanics, Pain, Decreased range of motion, Increased muscle spasms, Increased fascial restricitons, Decreased mobility, Decreased activity tolerance  Visit Diagnosis: Cervical radiculopathy  Abnormal posture  Other symptoms and signs involving the musculoskeletal system     Problem List Patient Active Problem List   Diagnosis Date Noted  . Vitamin D deficiency 01/09/2016  . History of prolactinoma 01/09/2016  . History of basal cell carcinoma 01/09/2016  . Cervical disc disorder with radiculopathy of cervical region 01/09/2016  . Elevated C-reactive protein (CRP) 01/09/2016    Stacy Preston PT, MPH  01/23/2016, 3:45 PM  [facility] [place], [place], [address] Phone: [phone]   Fax:  [phone]  Name: Stacy Preston MRN: [id number] Date of Birth: 06-18-1960

## 2016-01-23 NOTE — Patient Instructions (Addendum)
Neurovascular: Median Nerve Stretch - Supine    Lie with neck supported, side-bent away from moving arm. Hold right arm out to side, elbow bent, thumb down, fingers and wrist bent back. Slowly straighten elbowas far as possible without pain. Hold for __60__ seconds. Repeat __2__ times per set. Do _2___ sessions per day   Trigger Point Dry Needling  . What is Trigger Point Dry Needling (DN)? o DN is a physical therapy technique used to treat muscle pain and dysfunction. Specifically, DN helps deactivate muscle trigger points (muscle knots).  o A thin filiform needle is used to penetrate the skin and stimulate the underlying trigger point. The goal is for a local twitch response (LTR) to occur and for the trigger point to relax. No medication of any kind is injected during the procedure.   . What Does Trigger Point Dry Needling Feel Like?  o The procedure feels different for each individual patient. Some patients report that they do not actually feel the needle enter the skin and overall the process is not painful. Very mild bleeding may occur. However, many patients feel a deep cramping in the muscle in which the needle was inserted. This is the local twitch response.   Marland Kitchen How Will I feel after the treatment? o Soreness is normal, and the onset of soreness may not occur for a few hours. Typically this soreness does not last longer than two days.  o Bruising is uncommon, however; ice can be used to decrease any possible bruising.  o In rare cases feeling tired or nauseous after the treatment is normal. In addition, your symptoms may get worse before they get better, this period will typically not last longer than 24 hours.   . What Can I do After My Treatment? o Increase your hydration by drinking more water for the next 24 hours. o You may place ice or heat on the areas treated that have become sore, however, do not use heat on inflamed or bruised areas. Heat often brings more relief post  needling. o You can continue your regular activities, but vigorous activity is not recommended initially after the treatment for 24 hours. o DN is best combined with other physical therapy such as strengthening, stretching, and other therapies.

## 2016-01-28 ENCOUNTER — Encounter: Payer: Self-pay | Admitting: Rehabilitative and Restorative Service Providers"

## 2016-02-02 ENCOUNTER — Ambulatory Visit (INDEPENDENT_AMBULATORY_CARE_PROVIDER_SITE_OTHER): Payer: Managed Care, Other (non HMO) | Admitting: Rehabilitative and Restorative Service Providers"

## 2016-02-02 ENCOUNTER — Encounter: Payer: Self-pay | Admitting: Rehabilitative and Restorative Service Providers"

## 2016-02-02 DIAGNOSIS — M5412 Radiculopathy, cervical region: Secondary | ICD-10-CM

## 2016-02-02 DIAGNOSIS — R29898 Other symptoms and signs involving the musculoskeletal system: Secondary | ICD-10-CM | POA: Diagnosis not present

## 2016-02-02 DIAGNOSIS — R293 Abnormal posture: Secondary | ICD-10-CM

## 2016-02-02 NOTE — Patient Instructions (Signed)
Resisted External Rotation: in Neutral - Bilateral   PALMS UP Sit or stand, tubing in both hands, elbows at sides, bent to 90, forearms forward. Pinch shoulder blades together and rotate forearms out. Keep elbows at sides. Repeat __10__ times per set. Do _2-3___ sets per session. Do _2-3___ sessions per day.

## 2016-02-02 NOTE — Therapy (Addendum)
[place], [place], [address] Phone: [phone]   Fax:  [phone]  Physical Therapy Treatment  Patient Details  Name: Stacy Preston MRN: [id number] Date of Birth: [birth year] Referring Provider: Dr Georgina Snell  Encounter Date: 02/02/2016      PT End of Session - 02/02/16 0933    Visit Number 3   Number of Visits 12   Date for PT Re-Evaluation 03/03/16   PT Start Time 0933   PT Stop Time 1029   PT Time Calculation (min) 56 min   Activity Tolerance Patient tolerated treatment well      Past Medical History:  Diagnosis Date  . History of basal cell carcinoma 01/09/2016  . Prolactinoma ([place])   . Vitamin D deficiency 01/09/2016    Past Surgical History:  Procedure Laterality Date  . ivf    . PLANTAR FASCIA RELEASE    . PLANTAR FASCIA SURGERY      There were no vitals filed for this visit.      Subjective Assessment - 02/02/16 0935    Subjective Patient reports some improvement. She is better. Notes less frequent pain into the Rt neck and shoudler. "Whatever we did worked". Wants to wait on the TDN and continue with current course of treatment.    Currently in Pain? Yes   Pain Score 5    Pain Location Shoulder   Pain Orientation Right   Pain Descriptors / Indicators Aching;Tightness;Tingling;Numbness   Pain Type Acute pain   Pain Onset More than a month ago   Pain Frequency Intermittent                         [facility] Adult PT Treatment/Exercise - 02/02/16 0001      Neck Exercises: Machines for Strengthening   UBE (Upper Arm Bike) L1 3 min alt fwd/back     Neck Exercises: Standing   Neck Retraction 10 reps;10 secs     Neck Exercises: Supine   Neck Retraction 10 secs;5 reps   Other Supine Exercise neural mob median nerve pathway 1 min x 2      Shoulder Exercises: Standing   Retraction Strengthening;Both;20 reps;Theraband   Theraband Level (Shoulder Retraction)  Level 1 (Yellow)   Other Standing Exercises scap squeeze 10 sec x 10 w/noodle      Shoulder Exercises: Stretch   Other Shoulder Stretches 3 way doorway 30 sec x 3      Moist Heat Therapy   Number Minutes Moist Heat 20 Minutes   Moist Heat Location Cervical  thoracic     Electrical Stimulation   Electrical Stimulation Location Rt cervical/pec/arm area   Electrical Stimulation Action IFC   Electrical Stimulation Parameters to tolerance   Electrical Stimulation Goals Tone;Pain     Manual Therapy   Manual therapy comments pt supine    Joint Mobilization cervical PA and laterla mobs C 5/6/7/T1   Soft tissue mobilization Rt upper quarter > Lt    Myofascial Release Rt upper trap/lateral cervical    Passive ROM cervical flexion with slight lateral flexion - increase in Rt UE radicular symptoms with slight rotation to the Rt    Manual Traction manual cervical traction with 20-330 sec pulls throughout treatmet                 PT Education - 02/02/16 0946    Education provided Yes   Education Details HEP   Person(s) Educated Patient  [facility] [place], [place], [address] Phone: [phone]   Fax:  [phone]  Physical Therapy Treatment  Patient Details  Name: Stacy Preston MRN: [id number] Date of Birth: [birth year] Referring Provider: Dr Georgina Snell  Encounter Date: 02/02/2016      PT End of Session - 02/02/16 0933    Visit Number 3   Number of Visits 12   Date for PT Re-Evaluation 03/03/16   PT Start Time 0933   PT Stop Time 1029   PT Time Calculation (min) 56 min   Activity Tolerance Patient tolerated treatment well      Past Medical History:  Diagnosis Date  . History of basal cell carcinoma 01/09/2016  . Prolactinoma ([place])   . Vitamin D deficiency 01/09/2016    Past Surgical History:  Procedure Laterality Date  . ivf    . PLANTAR FASCIA RELEASE    . PLANTAR FASCIA SURGERY      There were no vitals filed for this visit.      Subjective Assessment - 02/02/16 0935    Subjective Patient reports some improvement. She is better. Notes less frequent pain into the Rt neck and shoudler. "Whatever we did worked". Wants to wait on the TDN and continue with current course of treatment.    Currently in Pain? Yes   Pain Score 5    Pain Location Shoulder   Pain Orientation Right   Pain Descriptors / Indicators Aching;Tightness;Tingling;Numbness   Pain Type Acute pain   Pain Onset More than a month ago   Pain Frequency Intermittent                         [facility] Adult PT Treatment/Exercise - 02/02/16 0001      Neck Exercises: Machines for Strengthening   UBE (Upper Arm Bike) L1 3 min alt fwd/back     Neck Exercises: Standing   Neck Retraction 10 reps;10 secs     Neck Exercises: Supine   Neck Retraction 10 secs;5 reps   Other Supine Exercise neural mob median nerve pathway 1 min x 2      Shoulder Exercises: Standing   Retraction Strengthening;Both;20 reps;Theraband   Theraband Level (Shoulder Retraction)  Level 1 (Yellow)   Other Standing Exercises scap squeeze 10 sec x 10 w/noodle      Shoulder Exercises: Stretch   Other Shoulder Stretches 3 way doorway 30 sec x 3      Moist Heat Therapy   Number Minutes Moist Heat 20 Minutes   Moist Heat Location Cervical  thoracic     Electrical Stimulation   Electrical Stimulation Location Rt cervical/pec/arm area   Electrical Stimulation Action IFC   Electrical Stimulation Parameters to tolerance   Electrical Stimulation Goals Tone;Pain     Manual Therapy   Manual therapy comments pt supine    Joint Mobilization cervical PA and laterla mobs C 5/6/7/T1   Soft tissue mobilization Rt upper quarter > Lt    Myofascial Release Rt upper trap/lateral cervical    Passive ROM cervical flexion with slight lateral flexion - increase in Rt UE radicular symptoms with slight rotation to the Rt    Manual Traction manual cervical traction with 20-330 sec pulls throughout treatmet                 PT Education - 02/02/16 0946    Education provided Yes   Education Details HEP   Person(s) Educated Patient  Methods Explanation;Demonstration;Tactile cues;Verbal cues;Handout   Comprehension Verbalized understanding;Returned demonstration;Verbal cues required;Tactile cues required             PT Long Term Goals - 02/02/16 1014      PT LONG TERM GOAL #1   Title Improve posture and alignment with patient to demonstrate more upright position of head and neck 03/03/16   Time 6   Period Weeks   Status On-going     PT LONG TERM GOAL #2   Title Improve radicular symptoms Rt UE with patient report minimal to no radicular symptoms 03/03/16   Time 6   Period Weeks   Status On-going     PT LONG TERM GOAL #3   Title Improve functional activities with patient to report sitting, standing and walking to 30-60 min without onset of symptoms 03/03/16   Time 6   Period Weeks   Status On-going     PT LONG TERM GOAL #4   Title Independent in HEP 03/03/16    Time 6   Period Weeks   Status On-going     PT LONG TERM GOAL #5   Title Improve FOTO to </= 26% limitation 03/03/16   Time 6   Period Weeks   Status On-going               Plan - 02/02/16 1011    Clinical Impression Statement Positive response to manual therapy with decrease in frequency of symptoms noted. Patient has not been consistent with HEP due to changing jobs and travelling with both old and new jobs. She was encouraged to try to do some of the exercises on a daily basis. note decreased muscular tightness through the upper trap; anterior chest/pecs and lateral cervical musculature but continued increased tissue tightness noted. Progressing gradually toward stated goals of therapy.    Rehab Potential Good   PT Frequency 2x / week   PT Duration 6 weeks   PT Treatment/Interventions Patient/family education;Neuromuscular re-education;Cryotherapy;Electrical Stimulation;Iontophoresis '4mg'$ /ml Dexamethasone;Moist Heat;Traction;Ultrasound;Dry needling;Manual techniques;Therapeutic activities;Therapeutic exercise   PT Next Visit Plan continue with ergonomic and postural education; manual work through the cervical and pec musculature; progress exercise adding neural mob stretch supine; modalities as indicated; trial of TDN as tolerated    Consulted and Agree with Plan of Care Patient      Patient will benefit from skilled therapeutic intervention in order to improve the following deficits and impairments:  Postural dysfunction, Improper body mechanics, Pain, Decreased range of motion, Increased muscle spasms, Increased fascial restricitons, Decreased mobility, Decreased activity tolerance  Visit Diagnosis: Cervical radiculopathy  Abnormal posture  Other symptoms and signs involving the musculoskeletal system     Problem List Patient Active Problem List   Diagnosis Date Noted  . Vitamin D deficiency 01/09/2016  . History of prolactinoma 01/09/2016  . History of basal cell  carcinoma 01/09/2016  . Cervical disc disorder with radiculopathy of cervical region 01/09/2016  . Elevated C-reactive protein (CRP) 01/09/2016    Stacy Preston Nilda Simmer PT, MPH  02/02/2016, 10:16 AM  [facility] [place], [place], [address] Phone: [phone]   Fax:  [phone]  Name: Stacy Preston MRN: [id number] Date of Birth: [birth year]  PHYSICAL THERAPY DISCHARGE SUMMARY  Visits from Start of Care: 3  Current functional level related to goals / functional outcomes: Improving. See progress note for discharge status.    Remaining deficits: See progress note - some continued symtoms   Education / Equipment: HEP Plan: Patient agrees

## 2016-02-06 ENCOUNTER — Ambulatory Visit: Payer: Self-pay | Admitting: Family Medicine

## 2016-02-27 DIAGNOSIS — Z01419 Encounter for gynecological examination (general) (routine) without abnormal findings: Secondary | ICD-10-CM | POA: Diagnosis not present

## 2016-02-27 DIAGNOSIS — D352 Benign neoplasm of pituitary gland: Secondary | ICD-10-CM | POA: Diagnosis not present

## 2016-02-27 DIAGNOSIS — N952 Postmenopausal atrophic vaginitis: Secondary | ICD-10-CM | POA: Insufficient documentation

## 2016-03-12 ENCOUNTER — Encounter: Payer: Self-pay | Admitting: Emergency Medicine

## 2016-03-12 ENCOUNTER — Emergency Department
Admission: EM | Admit: 2016-03-12 | Discharge: 2016-03-12 | Disposition: A | Discharge status: Home / Self Care | Payer: BLUE CROSS/BLUE SHIELD | Source: Home / Self Care | Attending: Family Medicine | Admitting: Family Medicine

## 2016-03-12 DIAGNOSIS — R21 Rash and other nonspecific skin eruption: Secondary | ICD-10-CM

## 2016-03-12 MED ORDER — DOXYCYCLINE HYCLATE 100 MG PO CAPS: 100.0000 mg | ORAL_CAPSULE | Freq: Two times a day (BID) | ORAL | 0 refills | Status: DC

## 2016-03-12 MED ORDER — PREDNISONE 20 MG PO TABS: 20.0000 mg | ORAL_TABLET | Freq: Two times a day (BID) | ORAL | 0 refills | Status: DC

## 2016-03-12 NOTE — Discharge Instructions (Signed)
Minimize baths/showers.  Use a mild bath soap containing oil such as unscented Dove.  Apply a moisturizing cream or lotion immediately after bathing while still wet, then towel dry.  

## 2016-03-12 NOTE — ED Provider Notes (Signed)
[facility]    CSN: [id number] Arrival date & time: 03/12/16  1816     History   Chief Complaint Chief Complaint  Patient presents with  . Rash    HPI Stacy Preston is a 56 y.o. female.   Patient complains of onset of a pruritic rash that appeared on her back and abdomen about 2 to 3 weeks ago while she was on a trip with her family.  She feels well otherwise.   The history is provided by the patient.  Rash  Location:  Torso Torso rash location: abdomen and back. Quality: dryness, itchiness and redness   Quality: not blistering, not bruising, not burning, not draining, not painful, not peeling, not scaling, not swelling and not weeping   Severity:  Mild Onset quality:  Sudden Duration:  3 weeks Timing:  Constant Progression:  Worsening Chronicity:  New Context: not animal contact, not chemical exposure, not exposure to similar rash, not food, not hot tub use, not insect bite/sting, not medications, not new detergent/soap, not nuts, not plant contact and not sick contacts   Relieved by:  Nothing Worsened by:  Nothing Ineffective treatments:  None tried Associated symptoms: no abdominal pain, no diarrhea, no fatigue, no fever, no headaches, no hoarse voice, no induration, no joint pain, no myalgias, no nausea, no shortness of breath, no sore throat, no URI and not wheezing     Past Medical History:  Diagnosis Date  . History of basal cell carcinoma 01/09/2016  . Prolactinoma ([place])   . Vitamin D deficiency 01/09/2016    Patient Active Problem List   Diagnosis Date Noted  . Vitamin D deficiency 01/09/2016  . History of prolactinoma 01/09/2016  . History of basal cell carcinoma 01/09/2016  . Cervical disc disorder with radiculopathy of cervical region 01/09/2016  . Elevated C-reactive protein (CRP) 01/09/2016    Past Surgical History:  Procedure Laterality Date  . ivf    . PLANTAR FASCIA RELEASE    . PLANTAR FASCIA SURGERY      OB History    No data available       Home Medications    Prior to Admission medications   Medication Sig Start Date End Date Taking? Authorizing Provider  cholecalciferol (VITAMIN D) 1000 UNITS tablet Take 2,000 Units by mouth daily.    Historical Provider, MD  doxycycline (VIBRAMYCIN) 100 MG capsule Take 1 capsule (100 mg total) by mouth 2 (two) times daily. Take with food. 03/12/16   Kandra Nicolas, MD  estradiol (ESTRACE) 0.1 MG/GM vaginal cream Place 1 Applicatorful vaginally at bedtime.    Historical Provider, MD  MELATONIN ER PO Take by mouth.    Historical Provider, MD  predniSONE (DELTASONE) 20 MG tablet Take 1 tablet (20 mg total) by mouth 2 (two) times daily. Take with food. 03/12/16   Kandra Nicolas, MD    Family History Family History  Problem Relation Age of Onset  . Renal Disease Mother   . Diabetes Mother   . Hypertension Mother   . Cancer Father     Lung CA  . Alcohol abuse Brother   . Hypertension Brother   . Diabetes Maternal Aunt   . Heart disease Maternal Grandmother   . Diabetes Maternal Grandmother   . Cancer Maternal Grandfather   . Diabetes Maternal Grandfather   . Stroke Paternal Grandmother   . Heart disease Paternal Grandfather     Social History Social History  Substance Use Topics  . Smoking status: Never  Smoker  . Smokeless tobacco: Never Used  . Alcohol use Yes     Allergies   Lactose intolerance (gi)   Review of Systems Review of Systems  Constitutional: Negative for fatigue and fever.  HENT: Negative for hoarse voice and sore throat.   Respiratory: Negative for shortness of breath and wheezing.   Gastrointestinal: Negative for abdominal pain, diarrhea and nausea.  Musculoskeletal: Negative for arthralgias and myalgias.  Skin: Positive for rash.  Neurological: Negative for headaches.  All other systems reviewed and are negative.    Physical Exam Triage Vital Signs ED Triage Vitals  Enc Vitals Group     BP 03/12/16 1851 125/78      Pulse Rate 03/12/16 1851 63     Resp 03/12/16 1851 16     Temp 03/12/16 1851 97.6 F (36.4 C)     Temp Source 03/12/16 1851 Oral     SpO2 03/12/16 1851 100 %     Weight 03/12/16 1852 185 lb (83.9 kg)     Height 03/12/16 1852 5\' 3"  (1.6 m)     Head Circumference --      Peak Flow --      Pain Score 03/12/16 1853 0     Pain Loc --      Pain Edu? --      Excl. in [place]? --    No data found.   Updated Vital Signs BP 125/78 (BP Location: Left Arm)   Pulse 63   Temp 97.6 F (36.4 C) (Oral)   Resp 16   Ht 5\' 3"  (1.6 m)   Wt 185 lb (83.9 kg)   SpO2 100%   BMI 32.77 kg/m   Visual Acuity Right Eye Distance:   Left Eye Distance:   Bilateral Distance:    Right Eye Near:   Left Eye Near:    Bilateral Near:     Physical Exam  Constitutional: She appears well-developed and well-nourished. No distress.  HENT:  Head: Normocephalic.  Right Ear: External ear normal.  Left Ear: External ear normal.  Nose: Nose normal.  Mouth/Throat: Oropharynx is clear and moist.  Eyes: Conjunctivae are normal. Pupils are equal, round, and reactive to light.  Neck: Neck supple.  Cardiovascular: Normal heart sounds.   Pulmonary/Chest: Breath sounds normal.  Abdominal: There is no tenderness.  Musculoskeletal: She exhibits no edema.  Lymphadenopathy:    She has no cervical adenopathy.  Neurological: She is alert.  Skin: Skin is warm and dry. Rash noted. Rash is not urticarial.     As noted on diagram, torso has multiple 2 to 33mm diameter slightly raised erythematous macules, some centered on follicles.  No tenderness to palpation.  No pustules.  Nursing note and vitals reviewed.    UC Treatments / Results  Labs (all labs ordered are listed, but only abnormal results are displayed) Labs Reviewed - No data to display  EKG  EKG Interpretation None       Radiology No results found.  Procedures Procedures (including critical care time)  Medications Ordered in UC Medications - No  data to display   Initial Impression / Assessment and Plan / UC Course  I have reviewed the triage vital signs and the nursing notes.  Pertinent labs & imaging results that were available during my care of the patient were reviewed by me and considered in my medical decision making (see chart for details).  Rash has characteristics of both heat rash and folliculitis.  Begin empiric doxycycline and prednisone. follow-up  with dermatologist if not improved two weeks.     Final Clinical Impressions(s) / UC Diagnoses   Final diagnoses:  Rash and nonspecific skin eruption    New Prescriptions New Prescriptions   DOXYCYCLINE (VIBRAMYCIN) 100 MG CAPSULE    Take 1 capsule (100 mg total) by mouth 2 (two) times daily. Take with food.   PREDNISONE (DELTASONE) 20 MG TABLET    Take 1 tablet (20 mg total) by mouth 2 (two) times daily. Take with food.     Kandra Nicolas, MD 03/20/16 [phone]

## 2016-03-12 NOTE — ED Triage Notes (Signed)
Reports scattered rash which appeared 2 weeks ago. Had been on trip with family, and no other members have rash. She has discontinued any/all newer products in lotions and laundry products; no new foods; no recent different medications. Has tried fragrance free lotions for itching; has not tried benadryl.

## 2016-03-14 ENCOUNTER — Telehealth: Payer: Self-pay | Admitting: Emergency Medicine

## 2016-03-14 NOTE — Telephone Encounter (Signed)
Patient states rash is improving.

## 2016-11-29 ENCOUNTER — Other Ambulatory Visit: Payer: Self-pay | Admitting: Obstetrics & Gynecology

## 2016-11-29 DIAGNOSIS — Z1231 Encounter for screening mammogram for malignant neoplasm of breast: Secondary | ICD-10-CM

## 2016-12-01 ENCOUNTER — Ambulatory Visit
Admission: RE | Admit: 2016-12-01 | Discharge: 2016-12-01 | Disposition: A | Discharge status: Home / Self Care | Payer: BLUE CROSS/BLUE SHIELD | Source: Ambulatory Visit | Attending: Obstetrics & Gynecology | Admitting: Obstetrics & Gynecology

## 2016-12-01 DIAGNOSIS — Z1231 Encounter for screening mammogram for malignant neoplasm of breast: Secondary | ICD-10-CM | POA: Diagnosis not present

## 2016-12-07 DIAGNOSIS — L249 Irritant contact dermatitis, unspecified cause: Secondary | ICD-10-CM | POA: Diagnosis not present

## 2016-12-07 DIAGNOSIS — Z08 Encounter for follow-up examination after completed treatment for malignant neoplasm: Secondary | ICD-10-CM | POA: Diagnosis not present

## 2016-12-07 DIAGNOSIS — L570 Actinic keratosis: Secondary | ICD-10-CM | POA: Diagnosis not present

## 2016-12-07 DIAGNOSIS — Z85828 Personal history of other malignant neoplasm of skin: Secondary | ICD-10-CM | POA: Diagnosis not present

## 2016-12-20 ENCOUNTER — Ambulatory Visit: Payer: BLUE CROSS/BLUE SHIELD | Admitting: Family Medicine

## 2016-12-21 ENCOUNTER — Ambulatory Visit (INDEPENDENT_AMBULATORY_CARE_PROVIDER_SITE_OTHER): Payer: BLUE CROSS/BLUE SHIELD | Admitting: Family Medicine

## 2016-12-21 VITALS — BP 134/81 | HR 66 | Wt 189.0 lb

## 2016-12-21 DIAGNOSIS — F411 Generalized anxiety disorder: Secondary | ICD-10-CM | POA: Diagnosis not present

## 2016-12-21 DIAGNOSIS — R51 Headache: Secondary | ICD-10-CM | POA: Diagnosis not present

## 2016-12-21 DIAGNOSIS — R519 Headache, unspecified: Secondary | ICD-10-CM

## 2016-12-21 MED ORDER — SERTRALINE HCL 25 MG PO TABS: ORAL_TABLET | ORAL | 1 refills | Status: DC

## 2016-12-21 NOTE — Progress Notes (Signed)
Stacy Preston is a 56 y.o. female who presents to [facility] today for headache and anxiety.  Patient notes worsening anxiety over the last several months. She has a demanding job that she does not like.  She notes this is caused worsening anxiety including difficulty sleeping and worrying. She has resolved to quit her job and find something more tolerable. She is interested in medications and possibly therapy.   She also notes a headache associated with anxiety over the past month. The headache is mild and associated with stress. She denies any loss of function lightheadedness dizziness vision loss or speech difficulty. She feels well otherwise.   Past Medical History:  Diagnosis Date  . History of basal cell carcinoma 01/09/2016  . Prolactinoma ([place])   . Vitamin D deficiency 01/09/2016   Past Surgical History:  Procedure Laterality Date  . ivf    . PLANTAR FASCIA RELEASE    . PLANTAR FASCIA SURGERY     Social History  Substance Use Topics  . Smoking status: Never Smoker  . Smokeless tobacco: Never Used  . Alcohol use Yes   family history includes Alcohol abuse in her brother; Cancer in her father and maternal grandfather; Diabetes in her maternal aunt, maternal grandfather, maternal grandmother, and mother; Heart disease in her maternal grandmother and paternal grandfather; Hypertension in her brother and mother; Renal Disease in her mother; Stroke in her paternal grandmother.  ROS as above:  Medications: Current Outpatient Prescriptions  Medication Sig Dispense Refill  . cholecalciferol (VITAMIN D) 1000 UNITS tablet Take 2,000 Units by mouth daily.    Marland Kitchen estradiol (ESTRACE) 0.1 MG/GM vaginal cream Place 1 Applicatorful vaginally at bedtime.    Marland Kitchen L-Methylfolate-B6-B12 (VITACIRC-B PO) Take by mouth.    . MELATONIN ER PO Take by mouth.    . sertraline  (ZOLOFT) 25 MG tablet Take 25mg  daily for 1 week and then increase to 50mg  daily. Recheck in 2 weeks. 30 tablet 1   No current facility-administered medications for this visit.    Allergies  Allergen Reactions  . Lactose Intolerance (Gi)     Health Maintenance Health Maintenance  Topic Date Due  . PAP SMEAR  09/06/1989  . INFLUENZA VACCINE  09/22/2016  . MAMMOGRAM  12/02/2018  . COLONOSCOPY  03/05/2020  . TETANUS/TDAP  01/08/2026  . Hepatitis C Screening  Completed  . HIV Screening  Completed     Exam:  BP 134/81   Pulse 66   Wt 189 lb (85.7 kg)   BMI 33.48 kg/m  Gen: Well NAD HEENT: EOMI,  MMM Lungs: Normal work of breathing. CTABL Heart: RRR no MRG Abd: NABS, Soft. Nondistended, Nontender Exts: Brisk capillary refill, warm and well perfused.   Nontender to midline. Mildly tender to palpation bilateral cervical paraspinal muscles. Normal neck motion. Upper extremity strength is intact. Neuro alert and oriented normal coordination balance and gait. Psych alert and oriented normal speech thought process. Affect is tearful at times. No SI or HI expressed.  Depression screen PHQ 2/9 12/21/2016  Decreased Interest 0  Down, Depressed, Hopeless 1  PHQ - 2 Score 1   GAD 7 : Generalized Anxiety Score 12/21/2016  Nervous, Anxious, on Edge 3  Control/stop worrying 3  Worry too much - different things 3  Trouble relaxing 3  Restless 0  Easily annoyed or irritable 1  Afraid - awful might happen 2  Total GAD 7 Score 15  Anxiety Difficulty Very  difficult       No results found for this or any previous visit (from the past 72 hour(s)). No results found.    Assessment and Plan: 56 y.o. female with  New generalized anxiety disorder. Quite symptomatic. We had a long discussion about treatment options. Plan to start Zoloft taper with goal at 100 mg daily. Additionally we'll refer to [facility] for counseling. Recheck in 2 weeks.  Headache: Likely related to  increased stress and poor quality sleep. If not improving with improved anxiety we'll continue further workup.   Orders Placed This Encounter  Procedures  . Ambulatory referral to Behavioral Health    Referral Priority:   Routine    Referral Type:   Psychiatric    Referral Reason:   Specialty Services Required    Requested Specialty:   Behavioral Health    Number of Visits Requested:   1   Meds ordered this encounter  Medications  . L-Methylfolate-B6-B12 (VITACIRC-B PO)    Sig: Take by mouth.  . sertraline (ZOLOFT) 25 MG tablet    Sig: Take 25mg  daily for 1 week and then increase to 50mg  daily. Recheck in 2 weeks.    Dispense:  30 tablet    Refill:  1     Discussed warning signs or symptoms. Please see discharge instructions. Patient expresses understanding.

## 2016-12-21 NOTE — Patient Instructions (Addendum)
Thank you for coming in today. Start zoloft taper up.  Take 1 pill daily for 1 week and then increase to 2 pills daily.  Recheck in 2 week or so.   I will also refer for therpay.  You should hear from them soon.  Let me know if you do not hear anything.   Sertraline tablets What is this medicine? SERTRALINE (SER tra leen) is used to treat depression. It may also be used to treat obsessive compulsive disorder, panic disorder, post-trauma stress, premenstrual dysphoric disorder (PMDD) or social anxiety. This medicine may be used for other purposes; ask your health care provider or pharmacist if you have questions. COMMON BRAND NAME(S): Zoloft What should I tell my health care provider before I take this medicine? They need to know if you have any of these conditions: -bleeding disorders -bipolar disorder or a family history of bipolar disorder -glaucoma -heart disease -high blood pressure -history of irregular heartbeat -history of low levels of calcium, magnesium, or potassium in the blood -if you often drink alcohol -liver disease -receiving electroconvulsive therapy -seizures -suicidal thoughts, plans, or attempt; a previous suicide attempt by you or a family member -take medicines that treat or prevent blood clots -thyroid disease -an unusual or allergic reaction to sertraline, other medicines, foods, dyes, or preservatives -pregnant or trying to get pregnant -breast-feeding How should I use this medicine? Take this medicine by mouth with a glass of water. Follow the directions on the prescription label. You can take it with or without food. Take your medicine at regular intervals. Do not take your medicine more often than directed. Do not stop taking this medicine suddenly except upon the advice of your doctor. Stopping this medicine too quickly may cause serious side effects or your condition may worsen. A special MedGuide will be given to you by the pharmacist with each  prescription and refill. Be sure to read this information carefully each time. Talk to your pediatrician regarding the use of this medicine in children. While this drug may be prescribed for children as young as 7 years for selected conditions, precautions do apply. Overdosage: If you think you have taken too much of this medicine contact a poison control center or emergency room at once. NOTE: This medicine is only for you. Do not share this medicine with others. What if I miss a dose? If you miss a dose, take it as soon as you can. If it is almost time for your next dose, take only that dose. Do not take double or extra doses. What may interact with this medicine? Do not take this medicine with any of the following medications: -cisapride -dofetilide -dronedarone -linezolid -MAOIs like Carbex, Eldepryl, Marplan, Nardil, and Parnate -methylene blue (injected into a vein) -pimozide -thioridazine This medicine may also interact with the following medications: -alcohol -amphetamines -aspirin and aspirin-like medicines -certain medicines for depression, anxiety, or psychotic disturbances -certain medicines for fungal infections like ketoconazole, fluconazole, posaconazole, and itraconazole -certain medicines for irregular heart beat like flecainide, quinidine, propafenone -certain medicines for migraine headaches like almotriptan, eletriptan, frovatriptan, naratriptan, rizatriptan, sumatriptan, zolmitriptan -certain medicines for sleep -certain medicines for seizures like carbamazepine, valproic acid, phenytoin -certain medicines that treat or prevent blood clots like warfarin, enoxaparin, dalteparin -cimetidine -digoxin -diuretics -fentanyl -isoniazid -lithium -NSAIDs, medicines for pain and inflammation, like ibuprofen or naproxen -other medicines that prolong the QT interval (cause an abnormal heart rhythm) -rasagiline -safinamide -supplements like St. John's wort, kava kava,  valerian -tolbutamide -tramadol -tryptophan This list  may not describe all possible interactions. Give your health care provider a list of all the medicines, herbs, non-prescription drugs, or dietary supplements you use. Also tell them if you smoke, drink alcohol, or use illegal drugs. Some items may interact with your medicine. What should I watch for while using this medicine? Tell your doctor if your symptoms do not get better or if they get worse. Visit your doctor or health care professional for regular checks on your progress. Because it may take several weeks to see the full effects of this medicine, it is important to continue your treatment as prescribed by your doctor. Patients and their families should watch out for new or worsening thoughts of suicide or depression. Also watch out for sudden changes in feelings such as feeling anxious, agitated, panicky, irritable, hostile, aggressive, impulsive, severely restless, overly excited and hyperactive, or not being able to sleep. If this happens, especially at the beginning of treatment or after a change in dose, call your health care professional. Dennis Bast may get drowsy or dizzy. Do not drive, use machinery, or do anything that needs mental alertness until you know how this medicine affects you. Do not stand or sit up quickly, especially if you are an older patient. This reduces the risk of dizzy or fainting spells. Alcohol may interfere with the effect of this medicine. Avoid alcoholic drinks. Your mouth may get dry. Chewing sugarless gum or sucking hard candy, and drinking plenty of water may help. Contact your doctor if the problem does not go away or is severe. What side effects may I notice from receiving this medicine? Side effects that you should report to your doctor or health care professional as soon as possible: -allergic reactions like skin rash, itching or hives, swelling of the face, lips, or tongue -anxious -black, tarry  stools -changes in vision -confusion -elevated mood, decreased need for sleep, racing thoughts, impulsive behavior -eye pain -fast, irregular heartbeat -feeling faint or lightheaded, falls -feeling agitated, angry, or irritable -hallucination, loss of contact with reality -loss of balance or coordination -loss of memory -painful or prolonged erections -restlessness, pacing, inability to keep still -seizures -stiff muscles -suicidal thoughts or other mood changes -trouble sleeping -unusual bleeding or bruising -unusually weak or tired -vomiting Side effects that usually do not require medical attention (report to your doctor or health care professional if they continue or are bothersome): -change in appetite or weight -change in sex drive or performance -diarrhea -increased sweating -indigestion, nausea -tremors This list may not describe all possible side effects. Call your doctor for medical advice about side effects. You may report side effects to FDA at 1-800-FDA-1088. Where should I keep my medicine? Keep out of the reach of children. Store at room temperature between 15 and 30 degrees C (59 and 86 degrees F). Throw away any unused medicine after the expiration date. NOTE: This sheet is a summary. It may not cover all possible information. If you have questions about this medicine, talk to your doctor, pharmacist, or health care provider.  2018 Elsevier/Gold Standard (2016-02-13 14:17:49)    Generalized Anxiety Disorder, Adult Generalized anxiety disorder (GAD) is a mental health disorder. People with this condition constantly worry about everyday events. Unlike normal anxiety, worry related to GAD is not triggered by a specific event. These worries also do not fade or get better with time. GAD interferes with life functions, including relationships, work, and school. GAD can vary from mild to severe. People with severe GAD can have intense waves  of anxiety with physical  symptoms (panic attacks). What are the causes? The exact cause of GAD is not known. What increases the risk? This condition is more likely to develop in:  Women.  People who have a family history of anxiety disorders.  People who are very shy.  People who experience very stressful life events, such as the death of a loved one.  People who have a very stressful family environment.  What are the signs or symptoms? People with GAD often worry excessively about many things in their lives, such as their health and family. They may also be overly concerned about:  Doing well at work.  Being on time.  Natural disasters.  Friendships.  Physical symptoms of GAD include:  Fatigue.  Muscle tension or having muscle twitches.  Trembling or feeling shaky.  Being easily startled.  Feeling like your heart is pounding or racing.  Feeling out of breath or like you cannot take a deep breath.  Having trouble falling asleep or staying asleep.  Sweating.  Nausea, diarrhea, or irritable bowel syndrome (IBS).  Headaches.  Trouble concentrating or remembering facts.  Restlessness.  Irritability.  How is this diagnosed? Your health care provider can diagnose GAD based on your symptoms and medical history. You will also have a physical exam. The health care provider will ask specific questions about your symptoms, including how severe they are, when they started, and if they come and go. Your health care provider may ask you about your use of alcohol or drugs, including prescription medicines. Your health care provider may refer you to a mental health specialist for further evaluation. Your health care provider will do a thorough examination and may perform additional tests to rule out other possible causes of your symptoms. To be diagnosed with GAD, a person must have anxiety that:  Is out of his or her control.  Affects several different aspects of his or her life, such as work  and relationships.  Causes distress that makes him or her unable to take part in normal activities.  Includes at least three physical symptoms of GAD, such as restlessness, fatigue, trouble concentrating, irritability, muscle tension, or sleep problems.  Before your health care provider can confirm a diagnosis of GAD, these symptoms must be present more days than they are not, and they must last for six months or longer. How is this treated? The following therapies are usually used to treat GAD:  Medicine. Antidepressant medicine is usually prescribed for long-term daily control. Antianxiety medicines may be added in severe cases, especially when panic attacks occur.  Talk therapy (psychotherapy). Certain types of talk therapy can be helpful in treating GAD by providing support, education, and guidance. Options include: ? Cognitive behavioral therapy (CBT). People learn coping skills and techniques to ease their anxiety. They learn to identify unrealistic or negative thoughts and behaviors and to replace them with positive ones. ? Acceptance and commitment therapy (ACT). This treatment teaches people how to be mindful as a way to cope with unwanted thoughts and feelings. ? Biofeedback. This process trains you to manage your body's response (physiological response) through breathing techniques and relaxation methods. You will work with a therapist while machines are used to monitor your physical symptoms.  Stress management techniques. These include yoga, meditation, and exercise.  A mental health specialist can help determine which treatment is best for you. Some people see improvement with one type of therapy. However, other people require a combination of therapies. Follow these instructions  at home:  Take over-the-counter and prescription medicines only as told by your health care provider.  Try to maintain a normal routine.  Try to anticipate stressful situations and allow extra time to  manage them.  Practice any stress management or self-calming techniques as taught by your health care provider.  Do not punish yourself for setbacks or for not making progress.  Try to recognize your accomplishments, even if they are small.  Keep all follow-up visits as told by your health care provider. This is important. Contact a health care provider if:  Your symptoms do not get better.  Your symptoms get worse.  You have signs of depression, such as: ? A persistently sad, cranky, or irritable mood. ? Loss of enjoyment in activities that used to bring you joy. ? Change in weight or eating. ? Changes in sleeping habits. ? Avoiding friends or family members. ? Loss of energy for normal tasks. ? Feelings of guilt or worthlessness. Get help right away if:  You have serious thoughts about hurting yourself or others. If you ever feel like you may hurt yourself or others, or have thoughts about taking your own life, get help right away. You can go to your nearest emergency department or call:  Your local emergency services (911 in the [place]  A suicide crisis helpline, such as the [place] at (437) 574-9511. This is open 24 hours a day.  Summary  Generalized anxiety disorder (GAD) is a mental health disorder that involves worry that is not triggered by a specific event.  People with GAD often worry excessively about many things in their lives, such as their health and family.  GAD may cause physical symptoms such as restlessness, trouble concentrating, sleep problems, frequent sweating, nausea, diarrhea, headaches, and trembling or muscle twitching.  A mental health specialist can help determine which treatment is best for you. Some people see improvement with one type of therapy. However, other people require a combination of therapies. This information is not intended to replace advice given to you by your health care provider. Make sure you discuss  any questions you have with your health care provider. Document Released: 06/05/2012 Document Revised: 12/30/2015 Document Reviewed: 12/30/2015 Elsevier Interactive Patient Education  [employer].

## 2016-12-30 ENCOUNTER — Telehealth: Payer: Self-pay | Admitting: Family Medicine

## 2016-12-30 NOTE — Telephone Encounter (Signed)
Pt called. She is due to increase her Zoloft to 50 mgs today and wants to know if she can stay at 25 mgs(current dosage) to see how this is working.  Thank you.

## 2016-12-30 NOTE — Telephone Encounter (Signed)
That is fine. She can decide with Dr. Georgina Snell at her follow-up appointment if an increase is appropriate.

## 2016-12-30 NOTE — Telephone Encounter (Signed)
Thanks [employer]. I relayed message to patient.

## 2016-12-30 NOTE — Telephone Encounter (Signed)
Pt advised of Physician recommendation, she will stay on 25mg  for now. Will contact office for visit if she decides she needs to increase dosage. No further questions.

## 2017-01-04 ENCOUNTER — Encounter: Payer: Self-pay | Admitting: Family Medicine

## 2017-01-04 ENCOUNTER — Ambulatory Visit: Payer: BLUE CROSS/BLUE SHIELD | Admitting: Family Medicine

## 2017-01-04 VITALS — BP 132/69 | HR 64 | Temp 98.2°F | Ht 63.0 in | Wt 190.0 lb

## 2017-01-04 DIAGNOSIS — F411 Generalized anxiety disorder: Secondary | ICD-10-CM

## 2017-01-04 MED ORDER — SERTRALINE HCL 50 MG PO TABS: 50.0000 mg | ORAL_TABLET | Freq: Every day | ORAL | 1 refills | Status: DC

## 2017-01-04 NOTE — Progress Notes (Signed)
Stacy Preston is a 56 y.o. female who presents to [facility] today for anxiety. Patient was seen 2 weeks ago for significant anxiety associated with headache. She was started on Zoloft and she has also since made some life changes. She has quit her job which was a huge source of anxiety. She's feeling much better taking 25 mg of Zoloft. She notes some anxiety associated with trying to find a new job that is feeling much better overall.   Past Medical History:  Diagnosis Date  . History of basal cell carcinoma 01/09/2016  . Prolactinoma ([place])   . Vitamin D deficiency 01/09/2016   Past Surgical History:  Procedure Laterality Date  . ivf    . PLANTAR FASCIA RELEASE    . PLANTAR FASCIA SURGERY     Social History   Tobacco Use  . Smoking status: Never Smoker  . Smokeless tobacco: Never Used  Substance Use Topics  . Alcohol use: Yes   family history includes Alcohol abuse in her brother; Cancer in her father and maternal grandfather; Diabetes in her maternal aunt, maternal grandfather, maternal grandmother, and mother; Heart disease in her maternal grandmother and paternal grandfather; Hypertension in her brother and mother; Renal Disease in her mother; Stroke in her paternal grandmother.  ROS as above:  Medications: Current Outpatient Medications  Medication Sig Dispense Refill  . cholecalciferol (VITAMIN D) 1000 UNITS tablet Take 2,000 Units by mouth daily.    Marland Kitchen estradiol (ESTRACE) 0.1 MG/GM vaginal cream Place 1 Applicatorful vaginally at bedtime.    Marland Kitchen L-Methylfolate-B6-B12 (VITACIRC-B PO) Take by mouth.    . MELATONIN ER PO Take by mouth.    . sertraline (ZOLOFT) 25 MG tablet Take 25mg  daily for 1 week and then increase to 50mg  daily. Recheck in 2 weeks. 30 tablet 1   No current facility-administered medications for this visit.    Allergies  Allergen  Reactions  . Lactose Intolerance (Gi)     Health Maintenance Health Maintenance  Topic Date Due  . PAP SMEAR  09/06/1989  . INFLUENZA VACCINE  09/22/2016  . MAMMOGRAM  12/02/2018  . COLONOSCOPY  03/05/2020  . TETANUS/TDAP  01/08/2026  . Hepatitis C Screening  Completed  . HIV Screening  Completed     Exam:  BP 132/69   Pulse 64   Temp 98.2 F (36.8 C) (Oral)   Ht 5\' 3"  (1.6 m)   Wt 190 lb (86.2 kg)   BMI 33.66 kg/m  Gen: Well NAD  Psych alert and oriented normal speech thought process and affect.  GAD 7 : Generalized Anxiety Score 01/04/2017 12/21/2016  Nervous, Anxious, on Edge 2 3  Control/stop worrying 2 3  Worry too much - different things 2 3  Trouble relaxing 0 3  Restless 0 0  Easily annoyed or irritable 0 1  Afraid - awful might happen 2 2  Total GAD 7 Score 8 15  Anxiety Difficulty Not difficult at all Very difficult      No results found for this or any previous visit (from the past 54 hour(s)). No results found.    Assessment and Plan: 56 y.o. female with anxiety significantly improved but not yet fully recovered. Plan to try increasing Zoloft to 50 mg and continue lifestyle changes. Patient will contact me in a few weeks for an update. Otherwise if she is doing well we'll recheck in 3 months.   No orders of the defined types  were placed in this encounter.  No orders of the defined types were placed in this encounter.    Discussed warning signs or symptoms. Please see discharge instructions. Patient expresses understanding.

## 2017-01-04 NOTE — Patient Instructions (Signed)
Thank you for coming in today. Increase zoloft to 50mg  daily.  If you feel strange decrease to 25.  Recheck with me in 3 months or sooner if needed.  Please contact me with updates or if not doing well.

## 2017-01-11 ENCOUNTER — Encounter: Payer: Self-pay | Admitting: Family Medicine

## 2017-01-11 ENCOUNTER — Ambulatory Visit (INDEPENDENT_AMBULATORY_CARE_PROVIDER_SITE_OTHER): Payer: Managed Care, Other (non HMO) | Admitting: Family Medicine

## 2017-01-11 VITALS — BP 137/74 | HR 69 | Temp 98.0°F | Resp 16 | Ht 63.0 in | Wt 188.1 lb

## 2017-01-11 DIAGNOSIS — R7982 Elevated C-reactive protein (CRP): Secondary | ICD-10-CM | POA: Diagnosis not present

## 2017-01-11 DIAGNOSIS — Z86018 Personal history of other benign neoplasm: Secondary | ICD-10-CM | POA: Diagnosis not present

## 2017-01-11 DIAGNOSIS — Z0000 Encounter for general adult medical examination without abnormal findings: Secondary | ICD-10-CM

## 2017-01-11 DIAGNOSIS — E559 Vitamin D deficiency, unspecified: Secondary | ICD-10-CM

## 2017-01-11 NOTE — Progress Notes (Signed)
Stacy Preston is a 56 y.o. female who presents to [facility] today for her yearly physical.  Patient has been doing quite well recently. Was placed on Zoloft 25-50mg  3 weeks ago in setting of anxiety. She has been doing much better recently. Thinks Zoloft helped but has since stopped taking medication. Was able to identify that job was really what was worrying her. She has since quit and her anxiety and sleeplessness have resolved. She will continue to self-monitor symptoms and reach out if anxiety returns.   Otherwise, patient's only other acute complaint is chest fluttering. She says it is very intermittent, happening less than once a week. Not symptomatic - no chest pain, shortness of breath, presyncope. She believes she has Holter monitor in past, which was unremarkable. Has had normal thyroid function in past.   Interested in losing weight. Has been going to the gym regularly for the last 3 weeks, since she quit her job. Also cooking more and eating out less since quitting job.  Patient due for Pap smear - has OBGYN scheduled for next month. Patient does have history pituitary adenoma and has yearly prolactin surveillance.    Past Medical History:  Diagnosis Date  . History of basal cell carcinoma 01/09/2016  . Prolactinoma ([place])   . Vitamin D deficiency 01/09/2016   Past Surgical History:  Procedure Laterality Date  . ivf    . PLANTAR FASCIA RELEASE    . PLANTAR FASCIA SURGERY     Social History   Tobacco Use  . Smoking status: Never Smoker  . Smokeless tobacco: Never Used  Substance Use Topics  . Alcohol use: Yes   family history includes Alcohol abuse in her brother; Cancer in her father and maternal grandfather; Diabetes in her maternal aunt, maternal grandfather, maternal grandmother, and mother; Heart disease in her maternal grandmother and  paternal grandfather; Hypertension in her brother and mother; Renal Disease in her mother; Stroke in her paternal grandmother.  ROS as above:  Medications: Current Outpatient Medications  Medication Sig Dispense Refill  . cholecalciferol (VITAMIN D) 1000 UNITS tablet Take 2,000 Units by mouth daily.    Marland Kitchen estradiol (ESTRACE) 0.1 MG/GM vaginal cream Place 1 Applicatorful vaginally at bedtime.    Marland Kitchen L-Methylfolate-B6-B12 (VITACIRC-B PO) Take by mouth.    . MELATONIN ER PO Take by mouth.     No current facility-administered medications for this visit.    Allergies  Allergen Reactions  . Lactose Intolerance (Gi)     Health Maintenance Health Maintenance  Topic Date Due  . PAP SMEAR  09/06/1989  . INFLUENZA VACCINE  10/23/2017 (Originally 09/22/2016)  . MAMMOGRAM  12/02/2018  . COLONOSCOPY  03/05/2020  . TETANUS/TDAP  01/08/2026  . Hepatitis C Screening  Completed  . HIV Screening  Completed     Exam:  BP 137/74   Pulse 69   Temp 98 F (36.7 C) (Oral)   Resp 16   Ht 5\' 3"  (1.6 m)   Wt 188 lb 1.9 oz (85.3 kg)   SpO2 100%   BMI 33.32 kg/m  Gen: Well NAD HEENT: EOMI,  MMM. Thyroid nonenlarged without palpable nodules. Lungs: Normal work of breathing. CTABL. No crackles to wheezes. Heart: RRR no MRG Abd: NABS, Soft. Nondistended, Nontender Exts: Brisk capillary refill, warm and well perfused.    No results found for this or any previous visit (from the past 72 hour(s)). No results found.   Assessment and  Plan: 56 y.o. female here for health maintenance exam.  Patient no longer taking Zoloft for anxiety. She is doing much better, not endorsing anxiety symptoms and is sleeping well. She quit her job and has adopted behavioral modifications. She is functioning well. We made shared decision to hold off on Zoloft. She will self-monitor for anxiety symptoms and reach out if she is not doing well. Well supported at home - does not need follow up at this point.  Palpitations:  patient with intermittent palpitations. Likely PACs; low concern for atrial fibrillation. Has Holter in the past which was normal per patient report. Will continue to monitor and order Holter if palpitations increase in frequency or if she becomes symptomatic. Thyroid labs to rule out thyroid etiology.   Weight loss: patient with goal weight loss of 50 lbs over time. Redirected to focus on behavioral modifications such as exercise and diet. She is pleased with her behavior change and will continue.   Health maintenance: Pap next month at [facility]. Colonoscopy every 5 years due to family history (good until 2022). Will draw prolactin today.    Orders Placed This Encounter  Procedures  . CBC  . COMPLETE METABOLIC PANEL WITH GFR  . Lipid Panel w/reflex Direct LDL  . Hemoglobin A1c  . TSH  . T4, free  . T3, free  . Prolactin   No orders of the defined types were placed in this encounter.    Discussed warning signs or symptoms. Please see discharge instructions. Patient expresses understanding.

## 2017-01-11 NOTE — Patient Instructions (Signed)
Thank you for coming in today. Get labs today.  Follow up with OBGYN.  Have them send me results.  Recheck yearly as needed.  Let me know if the palpitations worsen.    Palpitations A palpitation is the feeling that your heartbeat is irregular or is faster than normal. It may feel like your heart is fluttering or skipping a beat. Palpitations are usually not a serious problem. They may be caused by many things, including smoking, caffeine, alcohol, stress, and certain medicines. Although most causes of palpitations are not serious, palpitations can be a sign of a serious medical problem. In some cases, you may need further medical evaluation. Follow these instructions at home: Pay attention to any changes in your symptoms. Take these actions to help with your condition:  Avoid the following: ? Caffeinated coffee, tea, soft drinks, diet pills, and energy drinks. ? Chocolate. ? Alcohol.  Do not use any tobacco products, such as cigarettes, chewing tobacco, and e-cigarettes. If you need help quitting, ask your health care provider.  Try to reduce your stress and anxiety. Things that can help you relax include: ? Yoga. ? Meditation. ? Physical activity, such as swimming, jogging, or walking. ? Biofeedback. This is a method that helps you learn to use your mind to control things in your body, such as your heartbeats.  Get plenty of rest and sleep.  Take over-the-counter and prescription medicines only as told by your health care provider.  Keep all follow-up visits as told by your health care provider. This is important.  Contact a health care provider if:  You continue to have a fast or irregular heartbeat after 24 hours.  Your palpitations occur more often. Get help right away if:  You have chest pain or shortness of breath.  You have a severe headache.  You feel dizzy or you faint. This information is not intended to replace advice given to you by your health care provider.  Make sure you discuss any questions you have with your health care provider. Document Released: 02/06/2000 Document Revised: 07/14/2015 Document Reviewed: 10/24/2014 Elsevier Interactive Patient Education  2017 [employer].

## 2017-01-12 LAB — HEMOGLOBIN A1C
EAG (MMOL/L): 5.7 (calc)
HEMOGLOBIN A1C: 5.2 %{Hb} (ref <5.7)
MEAN PLASMA GLUCOSE: 103 (calc)

## 2017-01-12 LAB — COMPLETE METABOLIC PANEL WITH GFR
AG RATIO: 1.3 (calc) (ref 1.0–2.5)
ALT: 20 U/L (ref 6–29)
AST: 20 U/L (ref 10–35)
Albumin: 4.1 g/dL (ref 3.6–5.1)
Alkaline phosphatase (APISO): 72 U/L (ref 33–130)
BUN: 14 mg/dL (ref 7–25)
CALCIUM: 9.1 mg/dL (ref 8.6–10.4)
CO2: 28 mmol/L (ref 20–32)
CREATININE: 0.81 mg/dL (ref 0.50–1.05)
Chloride: 106 mmol/L (ref 98–110)
GFR, EST AFRICAN AMERICAN: 93 mL/min/{1.73_m2} (ref >=60)
GFR, EST NON AFRICAN AMERICAN: 81 mL/min/{1.73_m2} (ref >=60)
GLOBULIN: 3.1 g/dL (ref 1.9–3.7)
Glucose, Bld: 101 mg/dL — ABNORMAL HIGH (ref 65–99)
POTASSIUM: 4.3 mmol/L (ref 3.5–5.3)
Sodium: 141 mmol/L (ref 135–146)
TOTAL PROTEIN: 7.2 g/dL (ref 6.1–8.1)
Total Bilirubin: 0.6 mg/dL (ref 0.2–1.2)

## 2017-01-12 LAB — LIPID PANEL W/REFLEX DIRECT LDL
CHOL/HDL RATIO: 2.3 (calc) (ref <5.0)
Cholesterol: 174 mg/dL (ref <200)
HDL: 75 mg/dL (ref >50)
LDL Cholesterol (Calc): 85 mg/dL (calc)
NON-HDL CHOLESTEROL (CALC): 99 mg/dL (ref <130)
TRIGLYCERIDES: 65 mg/dL (ref <150)

## 2017-01-12 LAB — CBC
HEMATOCRIT: 40.5 % (ref 35.0–45.0)
Hemoglobin: 13.7 g/dL (ref 11.7–15.5)
MCH: 29.7 pg (ref 27.0–33.0)
MCHC: 33.8 g/dL (ref 32.0–36.0)
MCV: 87.9 fL (ref 80.0–100.0)
MPV: 10.2 fL (ref 7.5–12.5)
Platelets: 293 10*3/uL (ref 140–400)
RBC: 4.61 10*6/uL (ref 3.80–5.10)
RDW: 12.2 % (ref 11.0–15.0)
WBC: 7 10*3/uL (ref 3.8–10.8)

## 2017-01-12 LAB — PROLACTIN: PROLACTIN: 10.9 ng/mL

## 2017-01-12 LAB — T4, FREE: Free T4: 1 ng/dL (ref 0.8–1.8)

## 2017-01-12 LAB — T3, FREE: T3, Free: 3.1 pg/mL (ref 2.3–4.2)

## 2017-01-12 LAB — TSH: TSH: 1.36 m[IU]/L (ref 0.40–4.50)

## 2017-02-18 ENCOUNTER — Emergency Department (INDEPENDENT_AMBULATORY_CARE_PROVIDER_SITE_OTHER)
Admission: EM | Admit: 2017-02-18 | Discharge: 2017-02-18 | Disposition: A | Discharge status: Home / Self Care | Payer: Managed Care, Other (non HMO) | Source: Home / Self Care | Attending: Family Medicine | Admitting: Family Medicine

## 2017-02-18 ENCOUNTER — Other Ambulatory Visit: Payer: Self-pay

## 2017-02-18 ENCOUNTER — Telehealth (INDEPENDENT_AMBULATORY_CARE_PROVIDER_SITE_OTHER): Payer: Managed Care, Other (non HMO) | Admitting: Family Medicine

## 2017-02-18 ENCOUNTER — Encounter: Payer: Self-pay | Admitting: Family Medicine

## 2017-02-18 DIAGNOSIS — R079 Chest pain, unspecified: Secondary | ICD-10-CM

## 2017-02-18 NOTE — ED Provider Notes (Signed)
[facility]    CSN: [id number] Arrival date & time: 02/18/17  2426     History   Chief Complaint Chief Complaint  Patient presents with  . Chest Pain    HPI Taite Baldassari is a 56 y.o. female.   HPI Ashawnti Tangen is a 56 y.o. female presenting to UC with c/o intermittent centralized chest pain for about 2 weeks.  Pain is dull ache to sharp in nature, lasts less than 1 minute at a time. Nothing seems to make it better or worse.  Symptoms started after she started a new routine at the gym performing arm and upper body exercises, however, she never hurt at the time of working out.  Pain is minimal but "noticeable."  Denies prior hx of CAD.  Denies SOB, palpitations, diaphoresis or nausea during pain. Family hx of CAD c/w paternal and maternal grandfathers having pace makes in their 60s/70s.  Pt denies personal hx of DM, HTN, or high cholesterol.  She moved to [facility] in 2015 and had yearly EKGs by her PCP prior to moving.  She is unsure if those can be seen in our system but was never told they were abnormal.   Past Medical History:  Diagnosis Date  . History of basal cell carcinoma 01/09/2016  . Prolactinoma ([place])   . Vitamin D deficiency 01/09/2016    Patient Active Problem List   Diagnosis Date Noted  . GAD (generalized anxiety disorder) 12/21/2016  . Vitamin D deficiency 01/09/2016  . History of prolactinoma 01/09/2016  . History of basal cell carcinoma 01/09/2016  . Cervical disc disorder with radiculopathy of cervical region 01/09/2016  . Elevated C-reactive protein (CRP) 01/09/2016    Past Surgical History:  Procedure Laterality Date  . ivf    . PLANTAR FASCIA RELEASE    . PLANTAR FASCIA SURGERY      OB History    No data available       Home Medications    Prior to Admission medications   Medication Sig Start Date End Date Taking? Authorizing Provider  cholecalciferol (VITAMIN D) 1000 UNITS tablet Take 2,000 Units by mouth daily.    [provider]  estradiol (ESTRACE) 0.1 MG/GM vaginal cream Place 1 Applicatorful vaginally at bedtime.    [provider]  L-Methylfolate-B6-B12 (VITACIRC-B PO) Take by mouth.    [provider]  MELATONIN ER PO Take by mouth.    [provider]    Family History Family History  Problem Relation Age of Onset  . Renal Disease Mother   . Diabetes Mother   . Hypertension Mother   . Cancer Father        Lung CA  . Alcohol abuse Brother   . Hypertension Brother   . Diabetes Maternal Aunt   . Heart disease Maternal Grandmother   . Diabetes Maternal Grandmother   . Cancer Maternal Grandfather   . Diabetes Maternal Grandfather   . Stroke Paternal Grandmother   . Heart disease Paternal Grandfather     Social History Social History   Tobacco Use  . Smoking status: Never Smoker  . Smokeless tobacco: Never Used  Substance Use Topics  . Alcohol use: Yes  . Drug use: No     Allergies   Lactose intolerance (gi)   Review of Systems Review of Systems  Constitutional: Negative for chills, diaphoresis, fatigue and fever.  HENT: Negative for congestion, ear pain, sore throat, trouble swallowing and voice change.   Respiratory: Positive for chest  tightness. Negative for cough, shortness of breath, wheezing and stridor.   Cardiovascular: Positive for chest pain. Negative for palpitations.  Gastrointestinal: Negative for abdominal pain, diarrhea, nausea and vomiting.  Musculoskeletal: Negative for arthralgias, back pain and myalgias.  Skin: Negative for rash.  Neurological: Negative for dizziness and light-headedness.     Physical Exam Triage Vital Signs ED Triage Vitals  Enc Vitals Group     BP 02/18/17 0916 124/82     Pulse Rate 02/18/17 0916 70     Resp --      Temp 02/18/17 0916 98.4 F (36.9 C)     Temp Source 02/18/17 0916 Oral     SpO2 02/18/17 0916 100 %     Weight 02/18/17 0917 192 lb (87.1 kg)     Height 02/18/17 0917 5\' 3"  (1.6 m)      Head Circumference --      Peak Flow --      Pain Score 02/18/17 0917 4     Pain Loc --      Pain Edu? --      Excl. in [place]? --    No data found.  Updated Vital Signs BP 124/82 (BP Location: Right Arm)   Pulse 70   Temp 98.4 F (36.9 C) (Oral)   Ht 5\' 3"  (1.6 m)   Wt 192 lb (87.1 kg)   SpO2 100%   BMI 34.01 kg/m   Visual Acuity Right Eye Distance:   Left Eye Distance:   Bilateral Distance:    Right Eye Near:   Left Eye Near:    Bilateral Near:     Physical Exam  Constitutional: She is oriented to person, place, and time. She appears well-developed and well-nourished.  Non-toxic appearance. She does not appear ill. No distress.  HENT:  Head: Normocephalic and atraumatic.  Eyes: EOM are normal.  Neck: Normal range of motion.  Cardiovascular: Normal rate and regular rhythm.  Pulmonary/Chest: Effort normal and breath sounds normal. No accessory muscle usage. No tachypnea. No respiratory distress. She has no decreased breath sounds. She has no wheezes. She has no rhonchi. She has no rales. She exhibits no tenderness.  Musculoskeletal: Normal range of motion.  Neurological: She is alert and oriented to person, place, and time.  Skin: Skin is warm and dry.  Psychiatric: She has a normal mood and affect. Her behavior is normal.  Nursing note and vitals reviewed.    UC Treatments / Results  Labs (all labs ordered are listed, but only abnormal results are displayed) Labs Reviewed - No data to display  EKG  EKG Interpretation See scanned EKG       Radiology No results found.  Procedures Procedures (including critical care time)  Medications Ordered in UC Medications - No data to display   Initial Impression / Assessment and Plan / UC Course  I have reviewed the triage vital signs and the nursing notes.  Pertinent labs & imaging results that were available during my care of the patient were reviewed by me and considered in my medical decision making (see chart  for details).     Chest pain non-specific at this time.  Doubt ACS Discussed pt and EKG with her PCP, Dr. Georgina Snell, who recommends cardiology referral given pt's age and reported symptoms, however, agrees no emergency intervention indicated today. F/u with PCP next week if symptoms worsening prior to f/u with cardiology.  Discussed symptoms that warrant emergent care in the ED. Patient verbalized understanding and agreement with treatment  plan.   Final Clinical Impressions(s) / UC Diagnoses   Final diagnoses:  Nonspecific chest pain    ED Discharge Orders    None       Controlled Substance Prescriptions [place] Controlled Substance Registry consulted? Not Applicable   Tyrell Antonio 02/18/17 1008

## 2017-02-18 NOTE — Telephone Encounter (Signed)
Stacy Preston was seen in Urgent Care today for non-exertional chest pain. EKG largely normal.  Plan for cards referral for possible stress test.  Follow up with me PRN worsening.

## 2017-02-18 NOTE — ED Triage Notes (Signed)
Has been having chest pain for several weeks.  It the center of her chest and various times of the day.  Can not correlate it with any activity.  Denies diaphoresis, SOB.

## 2017-02-23 ENCOUNTER — Encounter: Payer: Self-pay | Admitting: Family Medicine

## 2017-02-24 NOTE — Addendum Note (Signed)
Addended by: Huel Cote on: 02/24/2017 11:34 AM   Modules accepted: Orders

## 2017-06-17 ENCOUNTER — Telehealth: Payer: Self-pay | Admitting: Emergency Medicine

## 2017-06-17 NOTE — Telephone Encounter (Signed)
Patient wants to make [place] aware we should be receiving [facility] results of screenings for herself and her husband, [facility]. If they do not show up soon she will be happy to bring copies of results to office.

## 2017-10-13 ENCOUNTER — Telehealth: Payer: Self-pay | Admitting: Family Medicine

## 2017-10-13 DIAGNOSIS — Z86018 Personal history of other benign neoplasm: Secondary | ICD-10-CM

## 2017-10-13 DIAGNOSIS — F411 Generalized anxiety disorder: Secondary | ICD-10-CM

## 2017-10-13 DIAGNOSIS — E559 Vitamin D deficiency, unspecified: Secondary | ICD-10-CM

## 2017-10-13 DIAGNOSIS — R7982 Elevated C-reactive protein (CRP): Secondary | ICD-10-CM

## 2017-10-13 NOTE — Telephone Encounter (Signed)
Pt called and got new health insurance and wanted to go ahead and schedule her physical for Sept 24th with Dr.Corey and is requesting that we put in her lab orders BEFORE Her appt and please call patient and let her know when this has been done

## 2017-10-13 NOTE — Telephone Encounter (Signed)
Labs ordered.  Labs should be done fasting in the morning.

## 2017-10-13 NOTE — Telephone Encounter (Signed)
Patient informed orders have been sent tomlab and to be fasting. KG LPN

## 2017-10-19 ENCOUNTER — Other Ambulatory Visit: Payer: Self-pay | Admitting: Obstetrics & Gynecology

## 2017-10-19 DIAGNOSIS — Z1231 Encounter for screening mammogram for malignant neoplasm of breast: Secondary | ICD-10-CM

## 2017-10-27 DIAGNOSIS — D1801 Hemangioma of skin and subcutaneous tissue: Secondary | ICD-10-CM | POA: Diagnosis not present

## 2017-10-27 DIAGNOSIS — D225 Melanocytic nevi of trunk: Secondary | ICD-10-CM | POA: Diagnosis not present

## 2017-10-27 DIAGNOSIS — L821 Other seborrheic keratosis: Secondary | ICD-10-CM | POA: Diagnosis not present

## 2017-10-27 DIAGNOSIS — L814 Other melanin hyperpigmentation: Secondary | ICD-10-CM | POA: Diagnosis not present

## 2017-11-11 DIAGNOSIS — Z86018 Personal history of other benign neoplasm: Secondary | ICD-10-CM | POA: Diagnosis not present

## 2017-11-11 DIAGNOSIS — F411 Generalized anxiety disorder: Secondary | ICD-10-CM | POA: Diagnosis not present

## 2017-11-11 DIAGNOSIS — R7982 Elevated C-reactive protein (CRP): Secondary | ICD-10-CM | POA: Diagnosis not present

## 2017-11-11 DIAGNOSIS — E559 Vitamin D deficiency, unspecified: Secondary | ICD-10-CM | POA: Diagnosis not present

## 2017-11-12 LAB — COMPLETE METABOLIC PANEL WITH GFR
AG Ratio: 1.5 (calc) (ref 1.0–2.5)
ALT: 15 U/L (ref 6–29)
AST: 18 U/L (ref 10–35)
Albumin: 4.1 g/dL (ref 3.6–5.1)
Alkaline phosphatase (APISO): 80 U/L (ref 33–130)
BUN: 15 mg/dL (ref 7–25)
CO2: 29 mmol/L (ref 20–32)
Calcium: 9.3 mg/dL (ref 8.6–10.4)
Chloride: 105 mmol/L (ref 98–110)
Creat: 0.92 mg/dL (ref 0.50–1.05)
GFR, EST NON AFRICAN AMERICAN: 69 mL/min/{1.73_m2} (ref >=60)
GFR, Est African American: 80 mL/min/{1.73_m2} (ref >=60)
GLOBULIN: 2.7 g/dL (ref 1.9–3.7)
GLUCOSE: 103 mg/dL — AB (ref 65–99)
Potassium: 4.3 mmol/L (ref 3.5–5.3)
SODIUM: 141 mmol/L (ref 135–146)
Total Bilirubin: 0.6 mg/dL (ref 0.2–1.2)
Total Protein: 6.8 g/dL (ref 6.1–8.1)

## 2017-11-12 LAB — CBC
HEMATOCRIT: 39.2 % (ref 35.0–45.0)
HEMOGLOBIN: 13 g/dL (ref 11.7–15.5)
MCH: 28.8 pg (ref 27.0–33.0)
MCHC: 33.2 g/dL (ref 32.0–36.0)
MCV: 86.9 fL (ref 80.0–100.0)
MPV: 10.1 fL (ref 7.5–12.5)
Platelets: 281 10*3/uL (ref 140–400)
RBC: 4.51 10*6/uL (ref 3.80–5.10)
RDW: 12.7 % (ref 11.0–15.0)
WBC: 7.6 10*3/uL (ref 3.8–10.8)

## 2017-11-12 LAB — PROLACTIN: PROLACTIN: 7.1 ng/mL

## 2017-11-12 LAB — T3, FREE: T3 FREE: 3.1 pg/mL (ref 2.3–4.2)

## 2017-11-12 LAB — T4, FREE: FREE T4: 1.2 ng/dL (ref 0.8–1.8)

## 2017-11-12 LAB — TSH: TSH: 1.79 mIU/L (ref 0.40–4.50)

## 2017-11-15 ENCOUNTER — Encounter: Payer: Managed Care, Other (non HMO) | Admitting: Family Medicine

## 2017-11-23 ENCOUNTER — Encounter: Payer: Self-pay | Admitting: Family Medicine

## 2017-11-23 ENCOUNTER — Ambulatory Visit (INDEPENDENT_AMBULATORY_CARE_PROVIDER_SITE_OTHER): Payer: BLUE CROSS/BLUE SHIELD | Admitting: Family Medicine

## 2017-11-23 VITALS — BP 109/64 | HR 76 | Ht 63.0 in | Wt 186.0 lb

## 2017-11-23 DIAGNOSIS — Z124 Encounter for screening for malignant neoplasm of cervix: Secondary | ICD-10-CM | POA: Diagnosis not present

## 2017-11-23 DIAGNOSIS — Z0000 Encounter for general adult medical examination without abnormal findings: Secondary | ICD-10-CM

## 2017-11-23 DIAGNOSIS — Z6832 Body mass index (BMI) 32.0-32.9, adult: Secondary | ICD-10-CM | POA: Diagnosis not present

## 2017-11-23 MED ORDER — ESTRADIOL 0.1 MG/GM VA CREA: 1.0000 | TOPICAL_CREAM | Freq: Every day | VAGINAL | 3 refills | Status: DC

## 2017-11-23 NOTE — Patient Instructions (Addendum)
Thank you for coming in today. Continue to check home blood pressures.  Try to get opportunistic exercise.  Try to reduce carbs.  Goal fewer than 60g per meal.  Continue the intermittent fasting as that seems to work with your life.  Return yearly or sooner if needed.  Consider compression stocking for legs.  If blood pressure is usually over 120/70 we should consider blood pressure medicine.   First medicine up for you will likely be HCTZ  Ask your insurance about Shingrix shingles vaccine.    Hydrochlorothiazide, HCTZ capsules or tablets What is this medicine? HYDROCHLOROTHIAZIDE (hye droe klor oh THYE a zide) is a diuretic. It increases the amount of urine passed, which causes the body to lose salt and water. This medicine is used to treat high blood pressure. It is also reduces the swelling and water retention caused by various medical conditions, such as heart, liver, or kidney disease. This medicine may be used for other purposes; ask your health care provider or pharmacist if you have questions. COMMON BRAND NAME(S): Esidrix, Ezide, HydroDIURIL, Microzide, Oretic, Zide What should I tell my health care provider before I take this medicine? They need to know if you have any of these conditions: -diabetes -gout -immune system problems, like lupus -kidney disease or kidney stones -liver disease -pancreatitis -small amount of urine or difficulty passing urine -an unusual or allergic reaction to hydrochlorothiazide, sulfa drugs, other medicines, foods, dyes, or preservatives -pregnant or trying to get pregnant -breast-feeding How should I use this medicine? Take this medicine by mouth with a glass of water. Follow the directions on the prescription label. Take your medicine at regular intervals. Remember that you will need to pass urine frequently after taking this medicine. Do not take your doses at a time of day that will cause you problems. Do not stop taking your medicine  unless your doctor tells you to. Talk to your pediatrician regarding the use of this medicine in children. Special care may be needed. Overdosage: If you think you have taken too much of this medicine contact a poison control center or emergency room at once. NOTE: This medicine is only for you. Do not share this medicine with others. What if I miss a dose? If you miss a dose, take it as soon as you can. If it is almost time for your next dose, take only that dose. Do not take double or extra doses. What may interact with this medicine? -cholestyramine -colestipol -digoxin -dofetilide -lithium -medicines for blood pressure -medicines for diabetes -medicines that relax muscles for surgery -other diuretics -steroid medicines like prednisone or cortisone This list may not describe all possible interactions. Give your health care provider a list of all the medicines, herbs, non-prescription drugs, or dietary supplements you use. Also tell them if you smoke, drink alcohol, or use illegal drugs. Some items may interact with your medicine. What should I watch for while using this medicine? Visit your doctor or health care professional for regular checks on your progress. Check your blood pressure as directed. Ask your doctor or health care professional what your blood pressure should be and when you should contact him or her. You may need to be on a special diet while taking this medicine. Ask your doctor. Check with your doctor or health care professional if you get an attack of severe diarrhea, nausea and vomiting, or if you sweat a lot. The loss of too much body fluid can make it dangerous for you to take this  medicine. You may get drowsy or dizzy. Do not drive, use machinery, or do anything that needs mental alertness until you know how this medicine affects you. Do not stand or sit up quickly, especially if you are an older patient. This reduces the risk of dizzy or fainting spells. Alcohol may  interfere with the effect of this medicine. Avoid alcoholic drinks. This medicine may affect your blood sugar level. If you have diabetes, check with your doctor or health care professional before changing the dose of your diabetic medicine. This medicine can make you more sensitive to the sun. Keep out of the sun. If you cannot avoid being in the sun, wear protective clothing and use sunscreen. Do not use sun lamps or tanning beds/booths. What side effects may I notice from receiving this medicine? Side effects that you should report to your doctor or health care professional as soon as possible: -allergic reactions such as skin rash or itching, hives, swelling of the lips, mouth, tongue, or throat -changes in vision -chest pain -eye pain -fast or irregular heartbeat -feeling faint or lightheaded, falls -gout attack -muscle pain or cramps -pain or difficulty when passing urine -pain, tingling, numbness in the hands or feet -redness, blistering, peeling or loosening of the skin, including inside the mouth -unusually weak or tired Side effects that usually do not require medical attention (report to your doctor or health care professional if they continue or are bothersome): -change in sex drive or performance -dry mouth -headache -stomach upset This list may not describe all possible side effects. Call your doctor for medical advice about side effects. You may report side effects to FDA at 1-800-FDA-1088. Where should I keep my medicine? Keep out of the reach of children. Store at room temperature between 15 and 30 degrees C (59 and 86 degrees F). Do not freeze. Protect from light and moisture. Keep container closed tightly. Throw away any unused medicine after the expiration date. NOTE: This sheet is a summary. It may not cover all possible information. If you have questions about this medicine, talk to your doctor, pharmacist, or health care provider.  2018 Elsevier/Gold Standard  (2009-10-03 12:57:37)

## 2017-11-23 NOTE — Progress Notes (Signed)
Stacy Preston is a 57 y.o. female who presents to [facility] today for well adult visit.  Stacy Preston is doing reasonably well overall.  She is trying to eat a healthy diet by doing some intermittent fasting and lower carbohydrates.  She previously was exercising regularly but started a new full-time job and notes that she is had less opportunity to exercise.  Additionally she notes some mild left leg swelling.  In the past she had a venous reflux study that did show some valvular problems in her left leg causing varicose veins.  She notes that her leg is mildly swollen but not particularly bothersome.  She feels well overall with no fevers chills nausea vomiting or diarrhea.  She takes medications listed below.   ROS as above:  Past Medical History:  Diagnosis Date  . History of basal cell carcinoma 01/09/2016  . Prolactinoma ([place])   . Vitamin D deficiency 01/09/2016   Past Surgical History:  Procedure Laterality Date  . ivf    . PLANTAR FASCIA RELEASE    . PLANTAR FASCIA SURGERY     Social History   Tobacco Use  . Smoking status: Never Smoker  . Smokeless tobacco: Never Used  Substance Use Topics  . Alcohol use: Yes   family history includes Alcohol abuse in her brother; Cancer in her father and maternal grandfather; Diabetes in her maternal aunt, maternal grandfather, maternal grandmother, and mother; Heart disease in her maternal grandmother and paternal grandfather; Hypertension in her brother and mother; Renal Disease in her mother; Stroke in her paternal grandmother.  Medications: Current Outpatient Medications  Medication Sig Dispense Refill  . cholecalciferol (VITAMIN D) 1000 UNITS tablet Take 2,000 Units by mouth daily.    Marland Kitchen estradiol (ESTRACE) 0.1 MG/GM vaginal cream Place 1 Applicatorful vaginally at bedtime. 42.5 g 3  . fluorouracil (EFUDEX) 5  % cream APPLY TO AFFECTED AREA(S) ON CHEST TWICE DAILY  1  . L-Methylfolate-B6-B12 (VITACIRC-B PO) Take by mouth.    . MELATONIN ER PO Take by mouth.     No current facility-administered medications for this visit.    Allergies  Allergen Reactions  . Lactose Intolerance (Gi)     Health Maintenance Health Maintenance  Topic Date Due  . INFLUENZA VACCINE  11/24/2018 (Originally 09/22/2017)  . PAP SMEAR  11/24/2018 (Originally 09/06/1989)  . MAMMOGRAM  12/02/2018  . COLONOSCOPY  03/05/2020  . TETANUS/TDAP  01/08/2026  . Hepatitis C Screening  Completed  . HIV Screening  Completed     Exam:  BP 109/64   Pulse 76   Ht 5\' 3"  (1.6 m)   Wt 186 lb (84.4 kg)   BMI 32.95 kg/m  Wt Readings from Last 5 Encounters:  11/23/17 186 lb (84.4 kg)  02/18/17 192 lb (87.1 kg)  01/11/17 188 lb 1.9 oz (85.3 kg)  01/04/17 190 lb (86.2 kg)  12/21/16 189 lb (85.7 kg)      Gen: Well NAD HEENT: EOMI,  MMM Lungs: Normal work of breathing. CTABL Heart: RRR no MRG Abd: NABS, Soft. Nondistended, Nontender Exts: Brisk capillary refill, warm and well perfused.  Left leg some small varicose veins present.  No significant change in calf diameter. Psych: Alert and oriented normal speech thought process and affect.  Depression screen [facility] 2/9 11/23/2017 01/11/2017 12/21/2016  Decreased Interest 0 0 0  Down, Depressed, Hopeless 0 0 1  PHQ - 2 Score 0 0 1  Altered sleeping 1 - -  Tired, decreased energy 0 - -  Change in appetite 0 - -  Feeling bad or failure about yourself  0 - -  Trouble concentrating 0 - -  Moving slowly or fidgety/restless 0 - -  Suicidal thoughts 0 - -  PHQ-9 Score 1 - -  Difficult doing work/chores Not difficult at all - -       Lab and Radiology Results Recent Results (from the past 2160 hour(s))  CBC     Status: None   Collection Time: 11/11/17  7:51 AM  Result Value Ref Range   WBC 7.6 3.8 - 10.8 Thousand/uL   RBC 4.51 3.80 - 5.10 Million/uL   Hemoglobin 13.0 11.7  - 15.5 g/dL   HCT 39.2 35.0 - 45.0 %   MCV 86.9 80.0 - 100.0 fL   MCH 28.8 27.0 - 33.0 pg   MCHC 33.2 32.0 - 36.0 g/dL   RDW 12.7 11.0 - 15.0 %   Platelets 281 140 - 400 Thousand/uL   MPV 10.1 7.5 - 12.5 fL  COMPLETE METABOLIC PANEL WITH GFR     Status: Abnormal   Collection Time: 11/11/17  7:51 AM  Result Value Ref Range   Glucose, Bld 103 (H) 65 - 99 mg/dL    Comment: .            Fasting reference interval . For someone without known diabetes, a glucose value between 100 and 125 mg/dL is consistent with prediabetes and should be confirmed with a follow-up test. .    BUN 15 7 - 25 mg/dL   Creat 0.92 0.50 - 1.05 mg/dL    Comment: For patients >42 years of age, the reference limit for Creatinine is approximately 13% higher for people identified as African-American. .    GFR, Est Non African American 69 > OR = 60 mL/min/1.35m2   GFR, Est African American 80 > OR = 60 mL/min/1.59m2   BUN/Creatinine Ratio NOT APPLICABLE 6 - 22 (calc)   Sodium 141 135 - 146 mmol/L   Potassium 4.3 3.5 - 5.3 mmol/L   Chloride 105 98 - 110 mmol/L   CO2 29 20 - 32 mmol/L   Calcium 9.3 8.6 - 10.4 mg/dL   Total Protein 6.8 6.1 - 8.1 g/dL   Albumin 4.1 3.6 - 5.1 g/dL   Globulin 2.7 1.9 - 3.7 g/dL (calc)   AG Ratio 1.5 1.0 - 2.5 (calc)   Total Bilirubin 0.6 0.2 - 1.2 mg/dL   Alkaline phosphatase (APISO) 80 33 - 130 U/L   AST 18 10 - 35 U/L   ALT 15 6 - 29 U/L  TSH     Status: None   Collection Time: 11/11/17  7:51 AM  Result Value Ref Range   TSH 1.79 0.40 - 4.50 mIU/L  T4, free     Status: None   Collection Time: 11/11/17  7:51 AM  Result Value Ref Range   Free T4 1.2 0.8 - 1.8 ng/dL  T3, free     Status: None   Collection Time: 11/11/17  7:51 AM  Result Value Ref Range   T3, Free 3.1 2.3 - 4.2 pg/mL  Prolactin     Status: None   Collection Time: 11/11/17  7:51 AM  Result Value Ref Range   Prolactin 7.1 ng/mL    Comment:             Reference Range  Females         Non-pregnant  3.0-30.0         Pregnant           10.0-209.0         Postmenopausal      2.0-20.0 . . .       Assessment and Plan: 57 y.o. female with  Well adult.  Doing reasonably well.  Plan to continue with careful diet and increase exercise for BMI greater than 30.  Additionally patient due for cervical cancer screening.  Plan to refer to OB/GYN per patient preference.  She prefers a female provider.  Recheck yearly or sooner if needed.   Orders Placed This Encounter  Procedures  . Ambulatory referral to Obstetrics / Gynecology    Referral Priority:   Routine    Referral Type:   Consultation    Referral Reason:   Specialty Services Required    Referred to Provider:   Emily Filbert, MD    Requested Specialty:   Obstetrics and Gynecology    Number of Visits Requested:   1   Meds ordered this encounter  Medications  . estradiol (ESTRACE) 0.1 MG/GM vaginal cream    Sig: Place 1 Applicatorful vaginally at bedtime.    Dispense:  42.5 g    Refill:  3     Discussed warning signs or symptoms. Please see discharge instructions. Patient expresses understanding.

## 2017-11-24 ENCOUNTER — Telehealth: Payer: Self-pay | Admitting: Family Medicine

## 2017-11-24 NOTE — Telephone Encounter (Signed)
Pap smear result received from [place].  Last performed October 2015.  Negative cytology negative HPV test.  Recheck Pap smear 5-year. Note will be sent to abstract.

## 2017-11-25 ENCOUNTER — Encounter: Payer: Self-pay | Admitting: Family Medicine

## 2017-12-05 ENCOUNTER — Ambulatory Visit
Admission: RE | Admit: 2017-12-05 | Discharge: 2017-12-05 | Disposition: A | Discharge status: Home / Self Care | Payer: BLUE CROSS/BLUE SHIELD | Source: Ambulatory Visit | Attending: Obstetrics & Gynecology | Admitting: Obstetrics & Gynecology

## 2017-12-05 DIAGNOSIS — Z1231 Encounter for screening mammogram for malignant neoplasm of breast: Secondary | ICD-10-CM | POA: Diagnosis not present

## 2017-12-19 ENCOUNTER — Ambulatory Visit (INDEPENDENT_AMBULATORY_CARE_PROVIDER_SITE_OTHER): Payer: BLUE CROSS/BLUE SHIELD | Admitting: Obstetrics & Gynecology

## 2017-12-19 ENCOUNTER — Encounter: Payer: Self-pay | Admitting: Obstetrics & Gynecology

## 2017-12-19 VITALS — BP 129/70 | HR 75 | Resp 16 | Ht 63.0 in | Wt 187.0 lb

## 2017-12-19 DIAGNOSIS — Z124 Encounter for screening for malignant neoplasm of cervix: Secondary | ICD-10-CM | POA: Diagnosis not present

## 2017-12-19 DIAGNOSIS — Z113 Encounter for screening for infections with a predominantly sexual mode of transmission: Secondary | ICD-10-CM | POA: Diagnosis not present

## 2017-12-19 DIAGNOSIS — B9689 Other specified bacterial agents as the cause of diseases classified elsewhere: Secondary | ICD-10-CM | POA: Diagnosis not present

## 2017-12-19 DIAGNOSIS — Z1151 Encounter for screening for human papillomavirus (HPV): Secondary | ICD-10-CM | POA: Diagnosis not present

## 2017-12-19 DIAGNOSIS — Z01419 Encounter for gynecological examination (general) (routine) without abnormal findings: Secondary | ICD-10-CM

## 2017-12-19 DIAGNOSIS — N760 Acute vaginitis: Secondary | ICD-10-CM | POA: Diagnosis not present

## 2017-12-19 DIAGNOSIS — N898 Other specified noninflammatory disorders of vagina: Secondary | ICD-10-CM

## 2017-12-19 MED ORDER — ESTRADIOL 0.1 MG/GM VA CREA: TOPICAL_CREAM | VAGINAL | 12 refills | Status: DC

## 2017-12-19 MED ORDER — ESTRADIOL 0.1 MG/GM VA CREA: TOPICAL_CREAM | VAGINAL | 12 refills | Status: AC

## 2017-12-19 NOTE — Progress Notes (Signed)
Subjective:    Stacy Preston is a 57 y.o. married P0 (adopted a daughter, now 52 yo)  female who presents for an annual exam. The patient has no complaints today. She needs a pap smear. She needs a refill of estrace cream. The patient is sexually active. GYN screening history: last pap: was normal. The patient wears seatbelts: yes. The patient participates in regular exercise: no. Has the patient ever been transfused or tattooed?: no. The patient reports that there is not domestic violence in her life.   Menstrual History: OB History    Gravida  1   Para      Term      Preterm      AB  1   Living        SAB  1   TAB      Ectopic      Multiple      Live Births              Menarche age: 38 No LMP recorded. Patient is postmenopausal.    The following portions of the patient's history were reviewed and updated as appropriate: allergies, current medications, past family history, past medical history, past social history, past surgical history and problem list.  Review of Systems Pertinent items are noted in HPI.   Married for 24 years Still has dryness with sex Menopause about 57 yo FH- no breast/gyn cancer + colon cancer in maternal GF, + DM Had a colonoscopy Had a mammogram 2 weeks ago     Objective:    BP 129/70   Pulse 75   Resp 16   Ht 5\' 3"  (1.6 m)   Wt 187 lb (84.8 kg)   BMI 33.13 kg/m   General Appearance:    Alert, cooperative, no distress, appears stated age  Head:    Normocephalic, without obvious abnormality, atraumatic  Eyes:    PERRL, conjunctiva/corneas clear, EOM's intact, fundi    benign, both eyes  Ears:    Normal TM's and external ear canals, both ears  Nose:   Nares normal, septum midline, mucosa normal, no drainage    or sinus tenderness  Throat:   Lips, mucosa, and tongue normal; teeth and gums normal  Neck:   Supple, symmetrical, trachea midline, no adenopathy;    thyroid:  no enlargement/tenderness/nodules; no carotid  bruit or JVD  Back:     Symmetric, no curvature, ROM normal, no CVA tenderness  Lungs:     Clear to auscultation bilaterally, respirations unlabored  Chest Wall:    No tenderness or deformity   Heart:    Regular rate and rhythm, S1 and S2 normal, no murmur, rub   or gallop  Breast Exam:    No tenderness, masses, or nipple abnormality  Abdomen:     Soft, non-tender, bowel sounds active all four quadrants,    no masses, no organomegaly  Genitalia:    Normal female without lesion, discharge or tenderness, frothy white discharge, normal size and shape, anteverted, mobile, non-tender, normal adnexal exam      Extremities:   Extremities normal, atraumatic, no cyanosis or edema  Pulses:   2+ and symmetric all extremities  Skin:   Skin color, texture, turgor normal, no rashes or lesions  Lymph nodes:   Cervical, supraclavicular, and axillary nodes normal  Neurologic:   CNII-XII intact, normal strength, sensation and reflexes    throughout  .    Assessment:    Healthy female exam.    Plan:  Thin prep Pap smear. with cotesting She declines abx for BV today as she doesn't have any symptoms, we discussed boric acid and probiotics

## 2017-12-20 LAB — CYTOLOGY - PAP
Adequacy: ABSENT
BACTERIAL VAGINITIS: POSITIVE — AB
Candida vaginitis: NEGATIVE
Diagnosis: NEGATIVE
HPV: NOT DETECTED
TRICH (WINDOWPATH): NEGATIVE

## 2017-12-21 ENCOUNTER — Telehealth: Payer: Self-pay

## 2017-12-21 DIAGNOSIS — B9689 Other specified bacterial agents as the cause of diseases classified elsewhere: Secondary | ICD-10-CM

## 2017-12-21 DIAGNOSIS — N760 Acute vaginitis: Principal | ICD-10-CM

## 2017-12-21 MED ORDER — METRONIDAZOLE 500 MG PO TABS: 500.0000 mg | ORAL_TABLET | Freq: Two times a day (BID) | ORAL | 0 refills | Status: DC

## 2017-12-21 NOTE — Telephone Encounter (Signed)
I called pt to let her know of positive BV results. Pt did not answer so I left her a voicemail asking her to check her MyChart messages for results.  Flagyl has been sent to her pharmacy.

## 2018-02-02 ENCOUNTER — Encounter: Payer: Self-pay | Admitting: Family Medicine

## 2018-02-03 MED ORDER — ALBUTEROL SULFATE HFA 108 (90 BASE) MCG/ACT IN AERS: 2.0000 | INHALATION_SPRAY | Freq: Four times a day (QID) | RESPIRATORY_TRACT | 0 refills | Status: DC | PRN

## 2018-03-13 ENCOUNTER — Encounter: Payer: Self-pay | Admitting: Family Medicine

## 2018-03-13 DIAGNOSIS — H9313 Tinnitus, bilateral: Secondary | ICD-10-CM

## 2018-03-14 NOTE — Telephone Encounter (Signed)
If constant ringing is a long-standing problem, and no hearing loss or tinnitus, this is called tinnitus and it obnoxious but not dangerous and there is no treatment. Can refer to audiology to verify this, but please let her know that referral may depend on insurance - if visit with primary care is required first I'm sure we could see her on an non-emergency basis.

## 2018-03-20 ENCOUNTER — Ambulatory Visit (INDEPENDENT_AMBULATORY_CARE_PROVIDER_SITE_OTHER): Payer: Managed Care, Other (non HMO) | Admitting: Family Medicine

## 2018-03-20 ENCOUNTER — Encounter: Payer: Self-pay | Admitting: Family Medicine

## 2018-03-20 VITALS — BP 136/77 | HR 77 | Ht 63.0 in | Wt 195.0 lb

## 2018-03-20 DIAGNOSIS — B373 Candidiasis of vulva and vagina: Secondary | ICD-10-CM | POA: Diagnosis not present

## 2018-03-20 DIAGNOSIS — Z113 Encounter for screening for infections with a predominantly sexual mode of transmission: Secondary | ICD-10-CM | POA: Diagnosis not present

## 2018-03-20 DIAGNOSIS — N898 Other specified noninflammatory disorders of vagina: Secondary | ICD-10-CM | POA: Diagnosis not present

## 2018-03-20 NOTE — Progress Notes (Signed)
   Subjective:    Patient ID: Stacy Preston is a 58 y.o. female presenting with Vaginitis  on 03/20/2018  HPI: No period since 2013. On estrace cream since 2015. Has been on same dosage. Increased to 3x/wk since October. 2 wk h/o vaginal irritaion, ? Discharge. Has had no symptoms of BV. Increased itchiness on the outside.  Review of Systems  Constitutional: Negative for chills and fever.  Respiratory: Negative for shortness of breath.   Cardiovascular: Negative for chest pain.  Gastrointestinal: Negative for abdominal pain, nausea and vomiting.  Genitourinary: Negative for dysuria.  Skin: Negative for rash.      Objective:    BP 136/77   Pulse 77   Ht 5\' 3"  (1.6 m)   Wt 195 lb (88.5 kg)   BMI 34.54 kg/m  Physical Exam Constitutional:      General: She is not in acute distress.    Appearance: She is well-developed.  HENT:     Head: Normocephalic and atraumatic.  Eyes:     General: No scleral icterus. Neck:     Musculoskeletal: Neck supple.  Cardiovascular:     Rate and Rhythm: Normal rate.  Pulmonary:     Effort: Pulmonary effort is normal.  Abdominal:     Palpations: Abdomen is soft.  Genitourinary:    Comments: BUS normal, vagina is pink vaginal cream is noted (medication) no discharge, cervix is nulliparous without lesion  Skin:    General: Skin is warm and dry.  Neurological:     Mental Status: She is alert and oriented to person, place, and time.         Assessment & Plan:  Vaginal irritation - Plan: Cervicovaginal ancillary only( [place])  Check cultures and treat accordingly, if negative, consider topical steroid cream.  Total face-to-face time with patient: 10 minutes. Over 50% of encounter was spent on counseling and coordination of care. Return if symptoms worsen or fail to improve.  Stacy Preston 03/20/2018 9:54 AM

## 2018-03-20 NOTE — Progress Notes (Signed)
Pt c/o of vaginal itching. No discharge.

## 2018-03-21 LAB — CERVICOVAGINAL ANCILLARY ONLY
Bacterial vaginitis: NEGATIVE
Candida vaginitis: POSITIVE — AB
Trichomonas: NEGATIVE

## 2018-03-21 MED ORDER — FLUCONAZOLE 150 MG PO TABS: 150.0000 mg | ORAL_TABLET | Freq: Every day | ORAL | 2 refills | Status: DC

## 2018-03-21 NOTE — Addendum Note (Signed)
Addended by: Donnamae Jude on: 03/21/2018 04:58 PM   Modules accepted: Orders

## 2018-04-04 ENCOUNTER — Encounter: Payer: Self-pay | Admitting: Family Medicine

## 2018-04-27 ENCOUNTER — Encounter: Payer: Self-pay | Admitting: Family Medicine

## 2018-05-29 ENCOUNTER — Other Ambulatory Visit: Payer: Self-pay | Admitting: Family Medicine

## 2018-05-30 ENCOUNTER — Encounter: Payer: Self-pay | Admitting: Family Medicine

## 2018-06-22 ENCOUNTER — Encounter (INDEPENDENT_AMBULATORY_CARE_PROVIDER_SITE_OTHER): Payer: Managed Care, Other (non HMO) | Admitting: Family Medicine

## 2018-06-22 DIAGNOSIS — Z7189 Other specified counseling: Secondary | ICD-10-CM | POA: Diagnosis not present

## 2018-06-22 NOTE — Telephone Encounter (Signed)
Dnya asked a question about safety and efficacy of medium-chain triglyceride supplementation. I reviewed her lipid panel obtained in 2018 and discussed the uncertainty of supplementation with medium-chain triglycerides and its safety and efficacy.  Additionally also recommend omega-3 fatty acids.  Stated that it is reasonable to continue taking the medium-chain to glycerides however evidence is somewhat lacking.  Also recommend checking lipid panel at some point after being on me to triglycerides for a while to make sure that the lipid panel has not changed much.  Total time 5 minutes.

## 2018-07-06 ENCOUNTER — Telehealth: Payer: Self-pay | Admitting: Family Medicine

## 2018-07-06 DIAGNOSIS — Z86018 Personal history of other benign neoplasm: Secondary | ICD-10-CM

## 2018-07-06 DIAGNOSIS — E559 Vitamin D deficiency, unspecified: Secondary | ICD-10-CM

## 2018-07-06 DIAGNOSIS — R7982 Elevated C-reactive protein (CRP): Secondary | ICD-10-CM

## 2018-07-06 DIAGNOSIS — Z6833 Body mass index (BMI) 33.0-33.9, adult: Secondary | ICD-10-CM

## 2018-07-06 DIAGNOSIS — F411 Generalized anxiety disorder: Secondary | ICD-10-CM

## 2018-07-06 DIAGNOSIS — M5010 Cervical disc disorder with radiculopathy, unspecified cervical region: Secondary | ICD-10-CM

## 2018-07-06 NOTE — Telephone Encounter (Signed)
Labs ordered in preparation for upcoming visit.  Labs should be done fasting.

## 2018-07-06 NOTE — Telephone Encounter (Signed)
Pt advised.

## 2018-07-06 NOTE — Telephone Encounter (Signed)
Patient states that her and husband need lab work called in so they can then schedule a virtual visit with PCP. States that PCP is aware of labs needed. Please advise.

## 2018-07-12 LAB — COMPLETE METABOLIC PANEL WITH GFR
AG Ratio: 1.5 (calc) (ref 1.0–2.5)
ALT: 16 U/L (ref 6–29)
AST: 19 U/L (ref 10–35)
Albumin: 4.1 g/dL (ref 3.6–5.1)
Alkaline phosphatase (APISO): 67 U/L (ref 37–153)
BUN: 15 mg/dL (ref 7–25)
CO2: 27 mmol/L (ref 20–32)
Calcium: 9.1 mg/dL (ref 8.6–10.4)
Chloride: 106 mmol/L (ref 98–110)
Creat: 0.92 mg/dL (ref 0.50–1.05)
GFR, Est African American: 80 mL/min/{1.73_m2} (ref >=60)
GFR, Est Non African American: 69 mL/min/{1.73_m2} (ref >=60)
Globulin: 2.7 g/dL (calc) (ref 1.9–3.7)
Glucose, Bld: 98 mg/dL (ref 65–99)
Potassium: 4.6 mmol/L (ref 3.5–5.3)
Sodium: 141 mmol/L (ref 135–146)
Total Bilirubin: 0.6 mg/dL (ref 0.2–1.2)
Total Protein: 6.8 g/dL (ref 6.1–8.1)

## 2018-07-12 LAB — CBC
HCT: 40.2 % (ref 35.0–45.0)
Hemoglobin: 13.2 g/dL (ref 11.7–15.5)
MCH: 29.3 pg (ref 27.0–33.0)
MCHC: 32.8 g/dL (ref 32.0–36.0)
MCV: 89.3 fL (ref 80.0–100.0)
MPV: 10.5 fL (ref 7.5–12.5)
Platelets: 288 10*3/uL (ref 140–400)
RBC: 4.5 10*6/uL (ref 3.80–5.10)
RDW: 12.7 % (ref 11.0–15.0)
WBC: 6.9 10*3/uL (ref 3.8–10.8)

## 2018-07-12 LAB — LIPID PANEL
Cholesterol: 172 mg/dL (ref <200)
HDL: 64 mg/dL (ref >=50)
LDL Cholesterol (Calc): 90 mg/dL (calc)
Non-HDL Cholesterol (Calc): 108 mg/dL (calc) (ref <130)
Total CHOL/HDL Ratio: 2.7 (calc) (ref <5.0)
Triglycerides: 86 mg/dL (ref <150)

## 2018-07-12 LAB — PROLACTIN: Prolactin: 6.9 ng/mL

## 2018-07-12 LAB — TSH: TSH: 1.89 mIU/L (ref 0.40–4.50)

## 2018-09-26 ENCOUNTER — Ambulatory Visit (INDEPENDENT_AMBULATORY_CARE_PROVIDER_SITE_OTHER): Payer: Managed Care, Other (non HMO)

## 2018-09-26 ENCOUNTER — Other Ambulatory Visit: Payer: Self-pay

## 2018-09-26 ENCOUNTER — Encounter: Payer: Self-pay | Admitting: Family Medicine

## 2018-09-26 ENCOUNTER — Ambulatory Visit (INDEPENDENT_AMBULATORY_CARE_PROVIDER_SITE_OTHER): Payer: Managed Care, Other (non HMO) | Admitting: Family Medicine

## 2018-09-26 VITALS — BP 133/65 | HR 74 | Temp 98.2°F | Wt 189.0 lb

## 2018-09-26 DIAGNOSIS — M79675 Pain in left toe(s): Secondary | ICD-10-CM | POA: Diagnosis not present

## 2018-09-26 DIAGNOSIS — M25562 Pain in left knee: Secondary | ICD-10-CM | POA: Diagnosis not present

## 2018-09-26 MED ORDER — DICLOFENAC SODIUM 1 % TD GEL
4.0000 g | Freq: Four times a day (QID) | TRANSDERMAL | 11 refills | Status: DC
Start: 2018-09-26 — End: 2018-11-27

## 2018-09-26 NOTE — Progress Notes (Signed)
Stacy Preston is a 58 y.o. female who presents to [place] today for left knee and left toe pain.  Stubbed left 5th toe Sunday. Pain and swelling present. Tried rest and flip flops and ice.  She feels reasonably well but has moderate pain especially with activity.  Left knee pain. Present for last few weeks. Feels clicking and loss of ROM.  She notes that she is having some swelling as well.  She is tried some ice with little benefit.  Symptoms are quite bothersome and interfere with quality of life.  She cannot recall any injury or change in activity that preceded her knee pain and swelling.   ROS:  As above  Exam:  BP 133/65   Pulse 74   Temp 98.2 F (36.8 C) (Oral)   Wt 189 lb (85.7 kg)   BMI 33.48 kg/m  Wt Readings from Last 5 Encounters:  09/26/18 189 lb (85.7 kg)  03/20/18 195 lb (88.5 kg)  12/19/17 187 lb (84.8 kg)  11/23/17 186 lb (84.4 kg)  02/18/17 192 lb (87.1 kg)   General: Well Developed, well nourished, and in no acute distress.  Neuro/Psych: Alert and oriented x3, extra-ocular muscles intact, able to move all 4 extremities, sensation grossly intact. Skin: Warm and dry, no rashes noted.  Respiratory: Not using accessory muscles, speaking in full sentences, trachea midline.  Cardiovascular: Pulses palpable, no extremity edema. Abdomen: Does not appear distended. MSK:  Left knee: Moderate effusion otherwise normal-appearing with no deformity or erythema. Range of motion 0-100 degrees Tender palpation medial joint line. Stable ligamentous exam. Patient guards a bit but McMurray's test are negative. Intact flexion and extension strength.  Left foot: Swelling and bruising seen predominantly at fifth toe.  Tender palpation.  Pulses cap refill and sensation intact distally.   Lab and Radiology Results X-ray images of the right knee and right foot obtained today personally independently reviewed  Right knee:  Mild medial compartment DJD.  No acute fractures.  Right foot: Transverse fracture at base of proximal phalanx of fifth toe.  No significant displacement or angulation.  No other fractures visible in the foot.  Await formal radiology overread    Assessment and Plan: 58 y.o. female with right fifth toe fracture.  Plan for buddy tape and postop shoe.  Recheck in about 3 weeks.  Return sooner if needed.  Knee pain: Likely exacerbation of mild DJD or mild degenerative meniscus tear.  Discussed treatment options.  Plan for trial of diclofenac gel.  Check back in a few weeks.  If not improving next up will likely be steroid injection.  Would like to avoid steroid injection today given acute fracture steroids may delay fracture healing.   PDMP not reviewed this encounter. Orders Placed This Encounter  Procedures  . DG Knee Complete 4 Views Left    Please include patellar sunrise, lateral, and weightbearing bilateral AP and bilateral rosenberg views    Standing Status:   Future    Number of Occurrences:   1    Standing Expiration Date:   11/26/2019    Order Specific Question:   Reason for exam:    Answer:   Please include patellar sunrise, lateral, and weightbearing bilateral AP and bilateral rosenberg views    Comments:   Please include patellar sunrise, lateral, and weightbearing bilateral AP and bilateral rosenberg views    Order Specific Question:   Preferred imaging location?    Answer:   [facility]  .  DG Knee 1-2 Views Right    Standing Status:   Future    Number of Occurrences:   1    Standing Expiration Date:   11/27/2019    Order Specific Question:   Reason for Exam (SYMPTOM  OR DIAGNOSIS REQUIRED)    Answer:   For use with the left knee x-ray bilateral AP and Rosenberg standing.    Order Specific Question:   Is the patient pregnant?    Answer:   No    Order Specific Question:   Preferred imaging location?    Answer:   [facility]  . DG Foot Complete Left     Standing Status:   Future    Number of Occurrences:   1    Standing Expiration Date:   11/26/2019    Order Specific Question:   Reason for Exam (SYMPTOM  OR DIAGNOSIS REQUIRED)    Answer:   eval poss 5th toe fx    Order Specific Question:   Is patient pregnant?    Answer:   No    Order Specific Question:   Preferred imaging location?    Answer:   [facility]    Order Specific Question:   Radiology Contrast Protocol - do NOT remove file path    Answer:   [email or web address]   Meds ordered this encounter  Medications  . diclofenac sodium (VOLTAREN) 1 % GEL    Sig: Apply 4 g topically 4 (four) times daily. To affected joint.    Dispense:  100 g    Refill:  11    Historical information moved to improve visibility of documentation.  Past Medical History:  Diagnosis Date  . History of basal cell carcinoma 01/09/2016  . Infertility, female   . Prolactinoma ([place])   . Vitamin D deficiency 01/09/2016   Past Surgical History:  Procedure Laterality Date  . ivf    . PLANTAR FASCIA RELEASE    . PLANTAR FASCIA SURGERY     Social History   Tobacco Use  . Smoking status: Never Smoker  . Smokeless tobacco: Never Used  Substance Use Topics  . Alcohol use: Yes   family history includes Alcohol abuse in her brother; Cancer in her father and maternal grandfather; Diabetes in her maternal aunt, maternal grandfather, maternal grandmother, and mother; Heart disease in her maternal grandmother and paternal grandfather; Hypertension in her brother and mother; Renal Disease in her mother; Stroke in her paternal grandmother.  Medications: Current Outpatient Medications  Medication Sig Dispense Refill  . Albuterol Sulfate (PROAIR RESPICLICK) 976 (90 Base) MCG/ACT AEPB Inhale 2 puffs into the lungs every 6 (six) hours as needed. 1 each 2  . cholecalciferol (VITAMIN D) 1000 UNITS tablet Take 2,000 Units by mouth daily.    Marland Kitchen estradiol (ESTRACE) 0.1  MG/GM vaginal cream Per vagina three times per week 42.5 g 12  . fluorouracil (EFUDEX) 5 % cream APPLY TO AFFECTED AREA(S) ON CHEST TWICE DAILY  1  . L-Methylfolate-B6-B12 (VITACIRC-B PO) Take by mouth.    . medium chain triglycerides (MCT OIL) oil Take by mouth 3 (three) times daily.    . diclofenac sodium (VOLTAREN) 1 % GEL Apply 4 g topically 4 (four) times daily. To affected joint. 100 g 11  . fluconazole (DIFLUCAN) 150 MG tablet Take 1 tablet (150 mg total) by mouth daily. Repeat in 24 hours if needed (Patient not taking: Reported on 09/26/2018) 2 tablet 2  . metroNIDAZOLE (FLAGYL) 500 MG tablet Take 1 tablet (500 mg total)  by mouth 2 (two) times daily. (Patient not taking: Reported on 09/26/2018) 14 tablet 0   No current facility-administered medications for this visit.    Allergies  Allergen Reactions  . Lactose Intolerance (Gi)       Discussed warning signs or symptoms. Please see discharge instructions. Patient expresses understanding.

## 2018-09-26 NOTE — Patient Instructions (Signed)
Thank you for coming in today. I think you have a fracture of your toe.  Buddy tape the toes.  Use a post op shoe as needed.  Recheck in 3-4 weeks or sooner if needed about your toe.   For your knee try the diclofenac gel.  If not improving again in about 3 weeks we will likely do an injection.  Cycling is ok if your feel ok.  Walking and pool is also ok.  Use buddy tape in the pool.     How to Buddy Tape Buddy taping refers to taping an injured finger or toe to an uninjured finger or toe that is next to it. This protects the injured finger or toe and keeps it from moving while the injury heals. You may buddy tape a finger or toe if you have a minor sprain. Your health care provider may buddy tape your finger or toe if you have a sprain, dislocation, or fracture. You may be told to replace your buddy taping as needed. What are the risks? Generally, buddy taping is safe. However, problems may occur, such as:  Skin injury or infection.  Reduced blood flow to the finger or toe.  Skin reaction to the tape. Do not buddy tape your toe if you have diabetes. Do not buddy tape if you know that you have an allergy to adhesives or surgical tape. Supplies needed:  Gauze pad, cotton, or cloth.  Tape. This may be called first-aid tape, surgical tape, or medical tape. How to buddy tape Before buddy taping Lessen any pain and swelling with rest, icing, and elevation:  Avoid activities that cause pain.  If directed, put ice on the injured area: ? Put ice in a plastic bag. ? Place a towel between your skin and the bag. ? Leave the ice on for 20 minutes, 2-3 times a day.  Raise (elevate) your hand or foot above the level of your heart while you are sitting or lying down.  Buddy taping procedure   Clean and dry your finger or toe as told by your health care provider.  Place a gauze pad or a piece of cloth or cotton between your injured finger or toe and the uninjured finger or toe.   Use tape to wrap around both fingers or toes so your injured finger or toe is secured to the uninjured finger or toe. ? The tape should be snug, but not tight. ? Make sure the ends of the piece of tape overlap. ? Avoid placing tape directly over the joint.  Change the tape and the padding as told by your health care provider. Remove and replace the tape or padding if it becomes loose, worn, dirty, or wet. After buddy taping  Watch the buddy-taped area and always remove buddy taping if your: ? Pain gets worse. ? Fingers or toes turn pale or blue. ? Skin becomes irritated. Follow these instructions at home:  Take over-the-counter and prescription medicines only as told by your health care provider.  Return to your normal activities as told by your health care provider. Ask your health care provider what activities are safe for you. Contact a health care provider if:  You have pain, swelling, or bruising that lasts longer than 3 days.  You have a fever.  Your skin is red, cracked, or irritated. Get help right away if:  The injured area becomes cold, numb, or pale.  You have severe pain, swelling, bruising, or loss of movement in your finger or  toe.  Your finger or toe changes shape (deformity). Summary  Buddy taping refers to taping an injured finger or toe to an uninjured finger or toe that is next to it.  You may buddy tape a finger or toe if you have a minor sprain.  Take over-the-counter and prescription medicines only as told by your health care provider. This information is not intended to replace advice given to you by your health care provider. Make sure you discuss any questions you have with your health care provider. Document Released: 03/18/2004 Document Revised: 06/01/2018 Document Reviewed: 02/20/2018 Elsevier Patient Education  2020 [employer].   Meniscus Tear  A meniscus tear is a knee injury that happens when a piece of the meniscus is torn. The meniscus  is a thick, rubbery, wedge-shaped cartilage in the knee. Two menisci are located in each knee. They sit between the upper bone (femur) and lower bone (tibia) that make up the knee joint. Each meniscus acts as a shock absorber for the knee. A torn meniscus is one of the most common types of knee injuries. This injury can range from mild to severe. Surgery may be needed to repair a severe tear. What are the causes? This condition may be caused by any kneeling, squatting, twisting, or pivoting movement. Sports-related injuries are the most common cause. These often occur from:  Running and stopping suddenly. ? Changing direction. ? Being tackled or knocked off your feet.  Lifting or carrying heavy weights. As people get older, their menisci get thinner and weaker. In these people, tears can happen more easily, such as from climbing stairs. What increases the risk? You are more likely to develop this condition if you:  Play contact sports.  Have a job that requires kneeling or squatting.  Are female.  Are over 25 years old. What are the signs or symptoms? Symptoms of this condition include:  Knee pain, especially at the side of the knee joint. You may feel pain when the injury occurs, or you may only hear a pop and feel pain later.  A feeling that your knee is clicking, catching, locking, or giving way.  Not being able to fully bend or extend your knee.  Bruising or swelling in your knee. How is this diagnosed? This condition may be diagnosed based on your symptoms and a physical exam. You may also have tests, such as:  X-rays.  MRI.  A procedure to look inside your knee with a narrow surgical telescope (arthroscopy). You may be referred to a knee specialist (orthopedic surgeon). How is this treated? Treatment for this injury depends on the severity of the tear. Treatment for a mild tear may include:  Rest.  Medicine to reduce pain and swelling. This is usually a nonsteroidal  anti-inflammatory drug (NSAID), like ibuprofen.  A knee brace, sleeve, or wrap.  Using crutches or a walker to keep weight off your knee and to help you walk.  Exercises to strengthen your knee (physical therapy). You may need surgery if you have a severe tear or if other treatments are not working. Follow these instructions at home: If you have a brace, sleeve, or wrap:  Wear it as told by your health care provider. Remove it only as told by your health care provider.  Loosen the brace, sleeve, or wrap if your toes tingle, become numb, or turn cold and blue.  Keep the brace, sleeve, or wrap clean and dry.  If the brace, sleeve, or wrap is not waterproof: ?  Do not let it get wet. ? Cover it with a watertight covering when you take a bath or shower. Managing pain and swelling   Take over-the-counter and prescription medicines only as told by your health care provider.  If directed, put ice on your knee: ? If you have a removable brace, sleeve, or wrap, remove it as told by your health care provider. ? Put ice in a plastic bag. ? Place a towel between your skin and the bag. ? Leave the ice on for 20 minutes, 2-3 times per day.  Move your toes often to avoid stiffness and to lessen swelling.  Raise (elevate) the injured area above the level of your heart while you are sitting or lying down. Activity  Do not use the injured limb to support your body weight until your health care provider says that you can. Use crutches or a walker as told by your health care provider.  Return to your normal activities as told by your health care provider. Ask your health care provider what activities are safe for you.  Perform range-of-motion exercises only as told by your health care provider.  Begin doing exercises to strengthen your knee and leg muscles only as told by your health care provider. After you recover, your health care provider may recommend these exercises to help prevent another  injury. General instructions  Use a knee brace, sleeve, or wrap as told by your health care provider.  Ask your health care provider when it is safe to drive if you have a brace, sleeve, or wrap on your knee.  Do not use any products that contain nicotine or tobacco, such as cigarettes, e-cigarettes, and chewing tobacco. If you need help quitting, ask your health care provider.  Ask your health care provider if the medicine prescribed to you: ? Requires you to avoid driving or using heavy machinery. ? Can cause constipation. You may need to take these actions to prevent or treat constipation:  Drink enough fluid to keep your urine pale yellow.  Take over-the-counter or prescription medicines.  Eat foods that are high in fiber, such as beans, whole grains, and fresh fruits and vegetables.  Limit foods that are high in fat and processed sugars, such as fried or sweet foods.  Keep all follow-up visits as told by your health care provider. This is important. Contact a health care provider if:  You have a fever.  Your knee becomes red, tender, or swollen.  Your pain medicine is not helping.  Your symptoms get worse or do not improve after 2 weeks of home care. Summary  A meniscus tear is a knee injury that happens when a piece of the meniscus is torn.  Treatment for this injury depends on the severity of the tear. You may need surgery if you have a severe tear or if other treatments are not working.  Rest, ice, and raise (elevate) your injured knee as told by your health care provider. This will help lessen pain and swelling.  Contact a health care provider if you have new symptoms, or your symptoms get worse or do not improve after 2 weeks of home care.  Keep all follow-up visits as told by your health care provider. This is important. This information is not intended to replace advice given to you by your health care provider. Make sure you discuss any questions you have with  your health care provider. Document Released: 05/01/2002 Document Revised: 08/23/2017 Document Reviewed: 08/23/2017 Elsevier Patient Education  2020 Elsevier Inc.  

## 2018-10-10 ENCOUNTER — Encounter: Payer: Self-pay | Admitting: Family Medicine

## 2018-10-10 MED ORDER — HYDROCORTISONE ACETATE 25 MG RE SUPP: 25.0000 mg | Freq: Two times a day (BID) | RECTAL | 12 refills | Status: DC | PRN

## 2018-10-16 ENCOUNTER — Encounter: Payer: Self-pay | Admitting: Family Medicine

## 2018-10-16 DIAGNOSIS — M79675 Pain in left toe(s): Secondary | ICD-10-CM

## 2018-10-17 ENCOUNTER — Ambulatory Visit (INDEPENDENT_AMBULATORY_CARE_PROVIDER_SITE_OTHER): Payer: Managed Care, Other (non HMO)

## 2018-10-17 ENCOUNTER — Other Ambulatory Visit: Payer: Self-pay

## 2018-10-17 ENCOUNTER — Ambulatory Visit (INDEPENDENT_AMBULATORY_CARE_PROVIDER_SITE_OTHER): Payer: Managed Care, Other (non HMO) | Admitting: Family Medicine

## 2018-10-17 ENCOUNTER — Encounter: Payer: Self-pay | Admitting: Family Medicine

## 2018-10-17 VITALS — BP 137/72 | HR 84 | Temp 98.2°F | Wt 194.0 lb

## 2018-10-17 DIAGNOSIS — M79675 Pain in left toe(s): Secondary | ICD-10-CM | POA: Diagnosis not present

## 2018-10-17 DIAGNOSIS — M25562 Pain in left knee: Secondary | ICD-10-CM | POA: Diagnosis not present

## 2018-10-17 DIAGNOSIS — S92515A Nondisplaced fracture of proximal phalanx of left lesser toe(s), initial encounter for closed fracture: Secondary | ICD-10-CM

## 2018-10-17 NOTE — Patient Instructions (Signed)
Thank you for coming in today.  Continue the diclofenac gel for the knee.  Continue buddy tape as needed for the toe.  Recheck in about 1 month for toe and medical checkup.

## 2018-10-17 NOTE — Progress Notes (Signed)
Stacy Preston is a 58 y.o. female who presents to [facility] today for follow-up to fracture and follow-up knee pain.  Patient was seen about 3 weeks ago for left fifth toe fracture and left knee pain.  Toe fracture: Fracture was at base of proximal phalanx.  Treated with postop shoe and buddy tape.  In the interim she notes considerable improvement in symptoms.  She notes still a little bit of swelling but significant decrease pain.  She is very happy with how things are going.  Additionally she has been bothered by continued knee pain and swelling.  She was treated with diclofenac gel as we wanted to avoid steroid use with fracture.  She notes that her knee pain has improved quite a bit.  She has less pain and swelling but overall still has some pain.  She thinks is well enough now that she like to avoid steroid injection if possible.    ROS:  As above  Exam:  BP 137/72   Pulse 84   Temp 98.2 F (36.8 C) (Oral)   Wt 194 lb (88 kg)   BMI 34.37 kg/m  Wt Readings from Last 5 Encounters:  10/17/18 194 lb (88 kg)  09/26/18 189 lb (85.7 kg)  03/20/18 195 lb (88.5 kg)  12/19/17 187 lb (84.8 kg)  11/23/17 186 lb (84.4 kg)   General: Well Developed, well nourished, and in no acute distress.  Neuro/Psych: Alert and oriented x3, extra-ocular muscles intact, able to move all 4 extremities, sensation grossly intact. Skin: Warm and dry, no rashes noted.  Respiratory: Not using accessory muscles, speaking in full sentences, trachea midline.  Cardiovascular: Pulses palpable, no extremity edema. Abdomen: Does not appear distended. MSK: Left knee: Minimal effusion otherwise normal-appearing Not particular tender to palpation. Range of motion 0-120 degrees. Stable ligamentous exam.  Intact strength.  Left fifth toe: Slightly red slightly swollen.  Not particularly tender.  Pulses cap refill sensation are intact    Lab and Radiology  Results X-ray images left fifth toe obtained today personally independently reviewed Fracture line still present at base of proximal phalanx however no displacement.  Callus formation and decrease fracture line visibility present.  Indicating healing. Await formal radiology review  Dg Knee 1-2 Views Right  Result Date: 09/26/2018 CLINICAL DATA:  Acute left knee pain EXAM: RIGHT KNEE - 1-2 VIEW COMPARISON:  None. FINDINGS: No fracture or malalignment. Mild narrowing of the medial joint space. IMPRESSION: Minimal degenerative change.  No acute osseous abnormality Electronically Signed   By: Donavan Foil M.D.   On: 09/26/2018 23:39   Dg Knee Complete 4 Views Left  Result Date: 09/26/2018 CLINICAL DATA:  Left knee pain EXAM: LEFT KNEE - COMPLETE 4+ VIEW COMPARISON:  Right knee radiographs 09/26/2018 FINDINGS: No fracture or malalignment. Minimal patellofemoral and medial joint space degenerative change. Small knee effusion. IMPRESSION: 1. Mild degenerative changes with knee effusion. 2. No acute osseous abnormality Electronically Signed   By: Donavan Foil M.D.   On: 09/26/2018 23:40   Dg Foot Complete Left  Result Date: 09/26/2018 CLINICAL DATA:  Left foot pain possible fifth toe fracture EXAM: LEFT FOOT - COMPLETE 3+ VIEW COMPARISON:  None. FINDINGS: Moderate plantar calcaneal spur. Acute nondisplaced fracture base of the fifth proximal phalanx without definitive articular extension. No subluxation IMPRESSION: Acute nondisplaced fracture base of fifth proximal phalanx Electronically Signed   By: Donavan Foil M.D.   On: 09/26/2018 23:41   I personally (independently) visualized and  performed the interpretation of the images attached in this note.   Assessment and Plan: 58 y.o. female with toe fracture.  Doing well with healing present.  Plan to continue buddy tape and conservative management.  Knee pain: Degenerative changes possible meniscus degeneration present.  Doing well enough now that  injection does not make as much sense.  Continue diclofenac gel.  Patient will be due for wellness visit in about 5 weeks.  We will schedule for October.  We will follow the above issues up then and proceed with further health maintenance items.  Patient declined flu vaccine today but is willing to consider it in October.  Additionally I informed patient that I am transitioning to sports medicine only [facility] in [place] starting in November.  Happy to see patient for continued sports medicine needs.  Discussed need for new PCP.  Provided some recommendations.    PDMP not reviewed this encounter. No orders of the defined types were placed in this encounter.  No orders of the defined types were placed in this encounter.   Historical information moved to improve visibility of documentation.  Past Medical History:  Diagnosis Date  . History of basal cell carcinoma 01/09/2016  . Infertility, female   . Prolactinoma ([facility])   . Vitamin D deficiency 01/09/2016   Past Surgical History:  Procedure Laterality Date  . ivf    . PLANTAR FASCIA RELEASE    . PLANTAR FASCIA SURGERY     Social History   Tobacco Use  . Smoking status: Never Smoker  . Smokeless tobacco: Never Used  Substance Use Topics  . Alcohol use: Yes   family history includes Alcohol abuse in her brother; Cancer in her father and maternal grandfather; Diabetes in her maternal aunt, maternal grandfather, maternal grandmother, and mother; Heart disease in her maternal grandmother and paternal grandfather; Hypertension in her brother and mother; Renal Disease in her mother; Stroke in her paternal grandmother.  Medications: Current Outpatient Medications  Medication Sig Dispense Refill  . Albuterol Sulfate (PROAIR RESPICLICK) 123XX123 (90 Base) MCG/ACT AEPB Inhale 2 puffs into the lungs every 6 (six) hours as needed. 1 each 2  . cholecalciferol (VITAMIN D) 1000 UNITS tablet Take 2,000 Units by mouth daily.    .  diclofenac sodium (VOLTAREN) 1 % GEL Apply 4 g topically 4 (four) times daily. To affected joint. 100 g 11  . estradiol (ESTRACE) 0.1 MG/GM vaginal cream Per vagina three times per week 42.5 g 12  . fluconazole (DIFLUCAN) 150 MG tablet Take 1 tablet (150 mg total) by mouth daily. Repeat in 24 hours if needed (Patient not taking: Reported on 09/26/2018) 2 tablet 2  . fluorouracil (EFUDEX) 5 % cream APPLY TO AFFECTED AREA(S) ON CHEST TWICE DAILY  1  . hydrocortisone (ANUSOL-HC) 25 MG suppository Place 1 suppository (25 mg total) rectally 2 (two) times daily as needed for hemorrhoids. 24 suppository 12  . L-Methylfolate-B6-B12 (VITACIRC-B PO) Take by mouth.    . medium chain triglycerides (MCT OIL) oil Take by mouth 3 (three) times daily.    . metroNIDAZOLE (FLAGYL) 500 MG tablet Take 1 tablet (500 mg total) by mouth 2 (two) times daily. (Patient not taking: Reported on 09/26/2018) 14 tablet 0   No current facility-administered medications for this visit.    Allergies  Allergen Reactions  . Lactose Intolerance (Gi)       Discussed warning signs or symptoms. Please see discharge instructions. Patient expresses understanding.

## 2018-11-06 ENCOUNTER — Other Ambulatory Visit: Payer: Self-pay | Admitting: Obstetrics & Gynecology

## 2018-11-06 DIAGNOSIS — Z1231 Encounter for screening mammogram for malignant neoplasm of breast: Secondary | ICD-10-CM

## 2018-11-20 ENCOUNTER — Encounter: Payer: Self-pay | Admitting: Family Medicine

## 2018-11-27 ENCOUNTER — Ambulatory Visit (INDEPENDENT_AMBULATORY_CARE_PROVIDER_SITE_OTHER): Payer: Managed Care, Other (non HMO) | Admitting: Family Medicine

## 2018-11-27 ENCOUNTER — Other Ambulatory Visit: Payer: Self-pay

## 2018-11-27 ENCOUNTER — Encounter: Payer: Self-pay | Admitting: Family Medicine

## 2018-11-27 VITALS — BP 125/77 | HR 66 | Temp 98.2°F | Wt 192.0 lb

## 2018-11-27 DIAGNOSIS — Z0000 Encounter for general adult medical examination without abnormal findings: Secondary | ICD-10-CM

## 2018-11-27 DIAGNOSIS — H9313 Tinnitus, bilateral: Secondary | ICD-10-CM

## 2018-11-27 DIAGNOSIS — Z6834 Body mass index (BMI) 34.0-34.9, adult: Secondary | ICD-10-CM

## 2018-11-27 DIAGNOSIS — Z23 Encounter for immunization: Secondary | ICD-10-CM

## 2018-11-27 DIAGNOSIS — H9319 Tinnitus, unspecified ear: Secondary | ICD-10-CM | POA: Insufficient documentation

## 2018-11-27 NOTE — Progress Notes (Signed)
Adianna Whipp is a 58 y.o. female who presents to [facility] today for well adult.  Jamela is doing reasonably well overall.  She is actively working to lose weight or keep the weight off.  She exercises regularly and tries to eat a careful diet.  She does not calorie count.  She is eating a low carbohydrate diet as that has helped previously and she has a family history of diabetes that she like to avoid if possible.  Additionally she was seen about 2 months ago for left fifth toe fracture.  Evaluated on August 4 and August 25.  She notes the toe is basically not hurting any longer and she thinks it has healed.  She does note some bothersome tinnitus but has seen a cardiologist for this already and has been told there is not much to do for it.   ROS as above:  Exam:  BP 125/77   Pulse 66   Temp 98.2 F (36.8 C) (Oral)   Wt 192 lb (87.1 kg)   BMI 34.01 kg/m  Wt Readings from Last 5 Encounters:  11/27/18 192 lb (87.1 kg)  10/17/18 194 lb (88 kg)  09/26/18 189 lb (85.7 kg)  03/20/18 195 lb (88.5 kg)  12/19/17 187 lb (84.8 kg)    Gen: Well NAD HEENT: EOMI,  MMM normal tympanic membranes bilaterally Lungs: Normal work of breathing. CTABL Heart: RRR no MRG Abd: NABS, Soft. Nondistended, Nontender Exts: Brisk capillary refill, warm and well perfused.  MSK: Left fifth toe normal-appearing nontender.  Lab and Radiology Results   Chemistry      Component Value Date/Time   NA 141 07/11/2018 0739   K 4.6 07/11/2018 0739   CL 106 07/11/2018 0739   CO2 27 07/11/2018 0739   BUN 15 07/11/2018 0739   CREATININE 0.92 07/11/2018 0739      Component Value Date/Time   CALCIUM 9.1 07/11/2018 0739   ALKPHOS 59 01/09/2016 0947   AST 19 07/11/2018 0739   ALT 16 07/11/2018 0739   BILITOT 0.6 07/11/2018 0739     Lab Results  Component Value Date   CHOL 172  07/11/2018   HDL 64 07/11/2018   LDLCALC 90 07/11/2018   TRIG 86 07/11/2018   CHOLHDL 2.7 07/11/2018      Assessment and Plan: 58 y.o. female with  Well adult.  Doing reasonably well.  Continue to work on lifestyle change for weight loss.  Set goal for 1400-1500 cal/day along with moderate exercise most days of the week.  Recommend calorie counting and measuring food.  Health maintenance is up-to-date.  Patient will receive flu vaccine today.  Recheck with new PCP in about a year or sooner if needed.  PDMP not reviewed this encounter. Orders Placed This Encounter  Procedures  . Flu Vaccine QUAD 36+ mos IM   No orders of the defined types were placed in this encounter.    Historical information moved to improve visibility of documentation.  Past Medical History:  Diagnosis Date  . History of basal cell carcinoma 01/09/2016  . Infertility, female   . Prolactinoma ([facility])   . Vitamin D deficiency 01/09/2016   Past Surgical History:  Procedure Laterality Date  . ivf    . PLANTAR FASCIA RELEASE    . PLANTAR FASCIA SURGERY     Social History   Tobacco Use  . Smoking status: Never Smoker  . Smokeless tobacco: Never Used  Substance  Use Topics  . Alcohol use: Yes   family history includes Alcohol abuse in her brother; Cancer in her father and maternal grandfather; Diabetes in her maternal aunt, maternal grandfather, maternal grandmother, and mother; Heart disease in her maternal grandmother and paternal grandfather; Hypertension in her brother and mother; Renal Disease in her mother; Stroke in her paternal grandmother.  Medications: Current Outpatient Medications  Medication Sig Dispense Refill  . Albuterol Sulfate (PROAIR RESPICLICK) 123XX123 (90 Base) MCG/ACT AEPB Inhale 2 puffs into the lungs every 6 (six) hours as needed. 1 each 2  . cholecalciferol (VITAMIN D) 1000 UNITS tablet Take 2,000 Units by mouth daily.    Marland Kitchen estradiol (ESTRACE) 0.1 MG/GM vaginal cream Per vagina  three times per week 42.5 g 12  . hydrocortisone (ANUSOL-HC) 25 MG suppository Place 1 suppository (25 mg total) rectally 2 (two) times daily as needed for hemorrhoids. 24 suppository 12  . L-Methylfolate-B6-B12 (VITACIRC-B PO) Take by mouth.    . medium chain triglycerides (MCT OIL) oil Take by mouth 3 (three) times daily.     No current facility-administered medications for this visit.    Allergies  Allergen Reactions  . Lactose Intolerance (Gi)      Discussed warning signs or symptoms. Please see discharge instructions. Patient expresses understanding.

## 2018-11-27 NOTE — Patient Instructions (Addendum)
Thank you for coming in today.  Diet: Goal 1400-1500 calories per day.  Fewer carbs and low glycemic index.  Moderate exercise 30 mins 5 days per days per week.   Plan recheck in about 1 year unless something comes up.   If the toe continue to bother you let me know and I will arrange for repeat xray.   Keep me updated.

## 2018-12-14 ENCOUNTER — Other Ambulatory Visit: Payer: Self-pay

## 2018-12-14 ENCOUNTER — Ambulatory Visit
Admission: RE | Admit: 2018-12-14 | Discharge: 2018-12-14 | Disposition: A | Discharge status: Home / Self Care | Payer: Managed Care, Other (non HMO) | Source: Ambulatory Visit | Attending: Obstetrics & Gynecology | Admitting: Obstetrics & Gynecology

## 2018-12-14 DIAGNOSIS — Z1231 Encounter for screening mammogram for malignant neoplasm of breast: Secondary | ICD-10-CM

## 2018-12-28 DIAGNOSIS — R030 Elevated blood-pressure reading, without diagnosis of hypertension: Secondary | ICD-10-CM | POA: Insufficient documentation

## 2019-05-12 ENCOUNTER — Ambulatory Visit: Payer: Self-pay | Attending: Internal Medicine

## 2019-05-12 DIAGNOSIS — Z23 Encounter for immunization: Secondary | ICD-10-CM

## 2019-05-12 NOTE — Progress Notes (Signed)
Covid-19 Vaccination Clinic  Name:  Talor Wandling    MRN: [id number] DOB: 1961/01/04  05/12/2019  Ms. Wisser was observed post Covid-19 immunization for 15 minutes without incident. She was provided with Vaccine Information Sheet and instruction to access the V-Safe system.   Ms. Rients was instructed to call 911 with any severe reactions post vaccine: Marland Kitchen Difficulty breathing  . Swelling of face and throat  . A fast heartbeat  . A bad rash all over body  . Dizziness and weakness   Immunizations Administered    Name Date Dose VIS Date Route   Pfizer COVID-19 Vaccine 05/12/2019  9:39 AM 0.3 mL 02/02/2019 Intramuscular   Manufacturer: [employer], [employer]   Lot: [id number]   NDC: [address]-4

## 2019-05-24 DIAGNOSIS — L821 Other seborrheic keratosis: Secondary | ICD-10-CM | POA: Diagnosis not present

## 2019-05-24 DIAGNOSIS — D1801 Hemangioma of skin and subcutaneous tissue: Secondary | ICD-10-CM | POA: Diagnosis not present

## 2019-05-24 DIAGNOSIS — L84 Corns and callosities: Secondary | ICD-10-CM | POA: Diagnosis not present

## 2019-05-24 DIAGNOSIS — L905 Scar conditions and fibrosis of skin: Secondary | ICD-10-CM | POA: Diagnosis not present

## 2019-06-05 ENCOUNTER — Ambulatory Visit: Payer: Self-pay | Attending: Internal Medicine

## 2019-06-05 DIAGNOSIS — Z23 Encounter for immunization: Secondary | ICD-10-CM

## 2019-06-05 NOTE — Progress Notes (Signed)
Covid-19 Vaccination Clinic  Name:  Stacy Preston    MRN: [id number] DOB: [birth year]  06/05/2019  [name] was observed post Covid-19 immunization for 15 minutes without incident. She was provided with Vaccine Information Sheet and instruction to access the V-Safe system.   [name] was instructed to call 911 with any severe reactions post vaccine: Marland Kitchen Difficulty breathing  . Swelling of face and throat  . A fast heartbeat  . A bad rash all over body  . Dizziness and weakness   Immunizations Administered    Name Date Dose VIS Date Route   Pfizer COVID-19 Vaccine 06/05/2019  4:04 PM 0.3 mL 02/02/2019 Intramuscular   Manufacturer: [employer], [employer]   Lot: [id number]   NDC: [address]-3

## 2019-08-12 ENCOUNTER — Emergency Department: Admit: 2019-08-12 | Discharge status: Against Medical Advice | Payer: Self-pay

## 2019-08-12 DIAGNOSIS — H1033 Unspecified acute conjunctivitis, bilateral: Secondary | ICD-10-CM | POA: Diagnosis not present

## 2019-11-09 ENCOUNTER — Telehealth: Payer: Self-pay | Admitting: General Practice

## 2019-11-09 NOTE — Telephone Encounter (Signed)
Patient has been made aware. Left voicemail with this information.

## 2019-11-09 NOTE — Telephone Encounter (Signed)
Thank you :)

## 2019-11-09 NOTE — Telephone Encounter (Signed)
Patient wants labs sent downstairs for her physical.

## 2019-11-09 NOTE — Telephone Encounter (Signed)
Pt will have to establish care with Dr. Zigmund Daniel. We are unable to order labs for patients who are transitioning/establishing care.

## 2019-11-15 ENCOUNTER — Other Ambulatory Visit: Payer: Self-pay | Admitting: Family Medicine

## 2019-11-15 DIAGNOSIS — Z1231 Encounter for screening mammogram for malignant neoplasm of breast: Secondary | ICD-10-CM

## 2019-11-18 ENCOUNTER — Other Ambulatory Visit: Payer: Self-pay | Admitting: Family Medicine

## 2019-11-28 ENCOUNTER — Ambulatory Visit (INDEPENDENT_AMBULATORY_CARE_PROVIDER_SITE_OTHER): Payer: BC Managed Care – PPO | Admitting: Family Medicine

## 2019-11-28 ENCOUNTER — Encounter: Payer: Self-pay | Admitting: Family Medicine

## 2019-11-28 VITALS — BP 158/90 | HR 67 | Temp 98.8°F | Ht 61.42 in | Wt 195.2 lb

## 2019-11-28 DIAGNOSIS — E559 Vitamin D deficiency, unspecified: Secondary | ICD-10-CM | POA: Diagnosis not present

## 2019-11-28 DIAGNOSIS — R399 Unspecified symptoms and signs involving the genitourinary system: Secondary | ICD-10-CM

## 2019-11-28 DIAGNOSIS — Z86018 Personal history of other benign neoplasm: Secondary | ICD-10-CM | POA: Diagnosis not present

## 2019-11-28 DIAGNOSIS — Z0000 Encounter for general adult medical examination without abnormal findings: Secondary | ICD-10-CM | POA: Diagnosis not present

## 2019-11-28 DIAGNOSIS — Z23 Encounter for immunization: Secondary | ICD-10-CM

## 2019-11-28 DIAGNOSIS — Z13220 Encounter for screening for lipoid disorders: Secondary | ICD-10-CM

## 2019-11-28 LAB — POCT URINALYSIS DIP (CLINITEK)
Bilirubin, UA: NEGATIVE
Blood, UA: NEGATIVE
Glucose, UA: NEGATIVE mg/dL
Ketones, POC UA: NEGATIVE mg/dL
Leukocytes, UA: NEGATIVE
Nitrite, UA: NEGATIVE
POC PROTEIN,UA: NEGATIVE
Spec Grav, UA: 1.025 (ref 1.010–1.025)
Urobilinogen, UA: 0.2 E.U./dL
pH, UA: 5 (ref 5.0–8.0)

## 2019-11-28 LAB — TSH: TSH: 1.16 mIU/L (ref 0.40–4.50)

## 2019-11-28 LAB — PROLACTIN: Prolactin: 5.9 ng/mL

## 2019-11-28 MED ORDER — FLUCONAZOLE 150 MG PO TABS: 150.0000 mg | ORAL_TABLET | Freq: Once | ORAL | 0 refills | Status: AC

## 2019-11-28 NOTE — Patient Instructions (Signed)

## 2019-11-28 NOTE — Progress Notes (Signed)
Stacy Preston - 59 y.o. female MRN [id number]  Date of birth: [birth year]  Subjective Chief Complaint  Patient presents with  . Establish Care    HPI Stacy Preston is a 59 y.o. female here today for annual exam.  She has a history of pituitary adenoma and anxiety.    Concerns today include LUQ pain.  Intermittent for the past couple of weeks.  Denies constipation, reflux or nausea.   Also having mild dysuria/irritation.  May be related to UTI.  Denies fever or chills.   She is a non-smoker.  She consumes EtOH occasionally during social gatherings.   She admits she could exercise more frequently but feels like her diet is pretty good.   She will take flu shot today.  She has had COVID vaccine.  She is undecided on shingles vaccine.   Review of Systems  Constitutional: Negative for chills, fever, malaise/fatigue and weight loss.  HENT: Negative for congestion, ear pain and sore throat.   Eyes: Negative for blurred vision, double vision and pain.  Respiratory: Negative for cough and shortness of breath.   Cardiovascular: Negative for chest pain and palpitations.  Gastrointestinal: Positive for abdominal pain. Negative for blood in stool, constipation, heartburn and nausea.  Genitourinary: Negative for dysuria and urgency.  Musculoskeletal: Negative for joint pain and myalgias.  Neurological: Negative for dizziness and headaches.  Endo/Heme/Allergies: Does not bruise/bleed easily.  Psychiatric/Behavioral: Negative for depression. The patient is not nervous/anxious and does not have insomnia.     Allergies  Allergen Reactions  . Lactose Intolerance (Gi)     Past Medical History:  Diagnosis Date  . History of basal cell carcinoma 01/09/2016  . Infertility, female   . Prolactinoma (HCC)   . Vitamin D deficiency 01/09/2016    Past Surgical History:  Procedure Laterality Date  . ivf    . PLANTAR FASCIA RELEASE    . PLANTAR FASCIA SURGERY      Social History    Socioeconomic History  . Marital status: Married    Spouse name: Not on file  . Number of children: Not on file  . Years of education: Not on file  . Highest education level: Not on file  Occupational History  . Not on file  Tobacco Use  . Smoking status: Never Smoker  . Smokeless tobacco: Never Used  Vaping Use  . Vaping Use: Never used  Substance and Sexual Activity  . Alcohol use: Yes  . Drug use: No  . Sexual activity: Yes    Birth control/protection: None  Other Topics Concern  . Not on file  Social History Narrative  . Not on file   Social Determinants of Health   Financial Resource Strain:   . Difficulty of Paying Living Expenses: Not on file  Food Insecurity:   . Worried About Programme researcher, broadcasting/film/video in the Last Year: Not on file  . Ran Out of Food in the Last Year: Not on file  Transportation Needs:   . Lack of Transportation (Medical): Not on file  . Lack of Transportation (Non-Medical): Not on file  Physical Activity:   . Days of Exercise per Week: Not on file  . Minutes of Exercise per Session: Not on file  Stress:   . Feeling of Stress : Not on file  Social Connections:   . Frequency of Communication with Friends and Family: Not on file  . Frequency of Social Gatherings with Friends and Family: Not on file  . Attends Religious Services: Not  on file  . Active Member of Clubs or Organizations: Not on file  . Attends Banker Meetings: Not on file  . Marital Status: Not on file    Family History  Problem Relation Age of Onset  . Renal Disease Mother   . Diabetes Mother   . Hypertension Mother   . Cancer Father        Lung CA  . Alcohol abuse Brother   . Hypertension Brother   . Diabetes Maternal Aunt   . Heart disease Maternal Grandmother   . Diabetes Maternal Grandmother   . Cancer Maternal Grandfather   . Diabetes Maternal Grandfather   . Stroke Paternal Grandmother   . Heart disease Paternal Grandfather     Health Maintenance   Topic Date Due  . COLONOSCOPY  03/05/2020  . MAMMOGRAM  12/13/2020  . PAP SMEAR-Modifier  12/20/2022  . TETANUS/TDAP  01/08/2026  . INFLUENZA VACCINE  Completed  . COVID-19 Vaccine  Completed  . Hepatitis C Screening  Completed  . HIV Screening  Completed     ----------------------------------------------------------------------------------------------------------------------------------------------------------------------------------------------------------------- Physical Exam BP (!) 158/90 (BP Location: Left Arm, Patient Position: Sitting, Cuff Size: Large)   Pulse 67   Temp 98.8 F (37.1 C) (Oral)   Ht 5' 1.42" (1.56 m)   Wt 195 lb 3.2 oz (88.5 kg)   SpO2 99%   BMI 36.38 kg/m   Physical Exam Constitutional:      General: She is not in acute distress.    Appearance: Normal appearance.  HENT:     Head: Normocephalic and atraumatic.     Nose: Nose normal.  Eyes:     General: No scleral icterus.    Conjunctiva/sclera: Conjunctivae normal.  Neck:     Thyroid: No thyromegaly.  Cardiovascular:     Rate and Rhythm: Normal rate and regular rhythm.     Heart sounds: Normal heart sounds.  Pulmonary:     Effort: Pulmonary effort is normal.     Breath sounds: Normal breath sounds.  Abdominal:     General: Bowel sounds are normal. There is no distension.     Palpations: Abdomen is soft.     Tenderness: There is no abdominal tenderness. There is no guarding.  Musculoskeletal:        General: Normal range of motion.     Cervical back: Normal range of motion and neck supple.  Lymphadenopathy:     Cervical: No cervical adenopathy.  Skin:    General: Skin is warm and dry.     Findings: No rash.  Neurological:     Mental Status: She is alert and oriented to person, place, and time.     Cranial Nerves: No cranial nerve deficit.     Coordination: Coordination normal.  Psychiatric:        Mood and Affect: Mood normal.        Behavior: Behavior normal.      ------------------------------------------------------------------------------------------------------------------------------------------------------------------------------------------------------------------- Assessment and Plan  Well adult exam Well adult.  Orders Placed This Encounter  Procedures  . Flu Vaccine QUAD 6+ mos PF IM (Fluarix Quad PF)  . COMPLETE METABOLIC PANEL WITH GFR  . CBC  . Lipid Profile  . TSH  . Prolactin  . Vitamin D (25 hydroxy)  . POCT URINALYSIS DIP (CLINITEK)  UA normal, will cover empirically for yeast with diflucan 150mg  x1 dose.  Immunizations: Flu vaccine given.  Given information on Shingrix.  Screening: lipid profile.  Pap, mammo and colonoscopy are up to date.  Anticipatory guidance/risk factor reduction:  Recommendations per AVS   Meds ordered this encounter  Medications  . fluconazole (DIFLUCAN) 150 MG tablet    Sig: Take 1 tablet (150 mg total) by mouth once for 1 dose.    Dispense:  1 tablet    Refill:  0    No follow-ups on file.    This visit occurred during the SARS-CoV-2 public health emergency.  Safety protocols were in place, including screening questions prior to the visit, additional usage of staff PPE, and extensive cleaning of exam room while observing appropriate contact time as indicated for disinfecting solutions.

## 2019-11-28 NOTE — Assessment & Plan Note (Signed)
Well adult.  Orders Placed This Encounter  Procedures  . Flu Vaccine QUAD 6+ mos PF IM (Fluarix Quad PF)  . COMPLETE METABOLIC PANEL WITH GFR  . CBC  . Lipid Profile  . TSH  . Prolactin  . Vitamin D (25 hydroxy)  . POCT URINALYSIS DIP (CLINITEK)  UA normal, will cover empirically for yeast with diflucan 150mg  x1 dose.  Immunizations: Flu vaccine given.  Given information on Shingrix.  Screening: lipid profile.  Pap, mammo and colonoscopy are up to date.  Anticipatory guidance/risk factor reduction:  Recommendations per AVS

## 2019-11-29 LAB — COMPLETE METABOLIC PANEL WITH GFR
AG Ratio: 1.6 (calc) (ref 1.0–2.5)
ALT: 15 U/L (ref 6–29)
AST: 18 U/L (ref 10–35)
Albumin: 4.2 g/dL (ref 3.6–5.1)
Alkaline phosphatase (APISO): 69 U/L (ref 37–153)
BUN: 16 mg/dL (ref 7–25)
CO2: 29 mmol/L (ref 20–32)
Calcium: 9.3 mg/dL (ref 8.6–10.4)
Chloride: 106 mmol/L (ref 98–110)
Creat: 0.97 mg/dL (ref 0.50–0.99)
GFR, Est African American: 74 mL/min/{1.73_m2} (ref >=60)
GFR, Est Non African American: 63 mL/min/{1.73_m2} (ref >=60)
Globulin: 2.7 g/dL (calc) (ref 1.9–3.7)
Glucose, Bld: 103 mg/dL — ABNORMAL HIGH (ref 65–99)
Potassium: 4.6 mmol/L (ref 3.5–5.3)
Sodium: 142 mmol/L (ref 135–146)
Total Bilirubin: 0.5 mg/dL (ref 0.2–1.2)
Total Protein: 6.9 g/dL (ref 6.1–8.1)

## 2019-11-29 LAB — CBC
HCT: 40.3 % (ref 35.0–45.0)
Hemoglobin: 13.1 g/dL (ref 11.7–15.5)
MCH: 29.2 pg (ref 27.0–33.0)
MCHC: 32.5 g/dL (ref 32.0–36.0)
MCV: 89.8 fL (ref 80.0–100.0)
MPV: 10.5 fL (ref 7.5–12.5)
Platelets: 265 10*3/uL (ref 140–400)
RBC: 4.49 10*6/uL (ref 3.80–5.10)
RDW: 12.6 % (ref 11.0–15.0)
WBC: 6.7 10*3/uL (ref 3.8–10.8)

## 2019-11-29 LAB — LIPID PANEL
Cholesterol: 155 mg/dL (ref <200)
HDL: 64 mg/dL (ref >=50)
LDL Cholesterol (Calc): 77 mg/dL (calc)
Non-HDL Cholesterol (Calc): 91 mg/dL (calc) (ref <130)
Total CHOL/HDL Ratio: 2.4 (calc) (ref <5.0)
Triglycerides: 67 mg/dL (ref <150)

## 2019-11-29 LAB — VITAMIN D 25 HYDROXY (VIT D DEFICIENCY, FRACTURES): Vit D, 25-Hydroxy: 39 ng/mL (ref 30–100)

## 2019-12-02 ENCOUNTER — Encounter: Payer: Self-pay | Admitting: Family Medicine

## 2019-12-17 ENCOUNTER — Ambulatory Visit
Admission: RE | Admit: 2019-12-17 | Discharge: 2019-12-17 | Disposition: A | Discharge status: Home / Self Care | Payer: BC Managed Care – PPO | Source: Ambulatory Visit | Attending: Family Medicine | Admitting: Family Medicine

## 2019-12-17 ENCOUNTER — Other Ambulatory Visit: Payer: Self-pay

## 2019-12-17 DIAGNOSIS — Z1231 Encounter for screening mammogram for malignant neoplasm of breast: Secondary | ICD-10-CM

## 2019-12-18 ENCOUNTER — Encounter: Payer: Self-pay | Admitting: Family Medicine

## 2019-12-25 ENCOUNTER — Telehealth: Payer: Self-pay

## 2019-12-25 ENCOUNTER — Other Ambulatory Visit: Payer: Self-pay | Admitting: Family Medicine

## 2019-12-25 MED ORDER — LOSARTAN POTASSIUM 25 MG PO TABS: 25.0000 mg | ORAL_TABLET | Freq: Every day | ORAL | 1 refills | Status: DC

## 2019-12-25 NOTE — Telephone Encounter (Signed)
Pt lvm stating she was monitoring BP.   It's been running in the high 185'B systolic, last night wen to 185.   Pt is requesting next steps from Dr. Zigmund Daniel.

## 2019-12-25 NOTE — Telephone Encounter (Signed)
Advised patient of resolution to concern.   She has agreed to start losartan. Advised continued monitoring and log of BP. F/u in 6 - 8 weeks.

## 2019-12-25 NOTE — Telephone Encounter (Signed)
Recommend starting losartan 25mg  daily.  Rx sent in.  Follow up with me in about 6-8 weeks, continue to monitor at home.

## 2019-12-29 IMAGING — MG DIGITAL SCREENING BILATERAL MAMMOGRAM WITH TOMO AND CAD
8 series · 8 of 24 positions shown · non-contrast
Comparison: Previous exam(s).

CLINICAL DATA: Screening.

EXAM:
DIGITAL SCREENING BILATERAL MAMMOGRAM WITH TOMO AND CAD

[L MLO synth-2D]
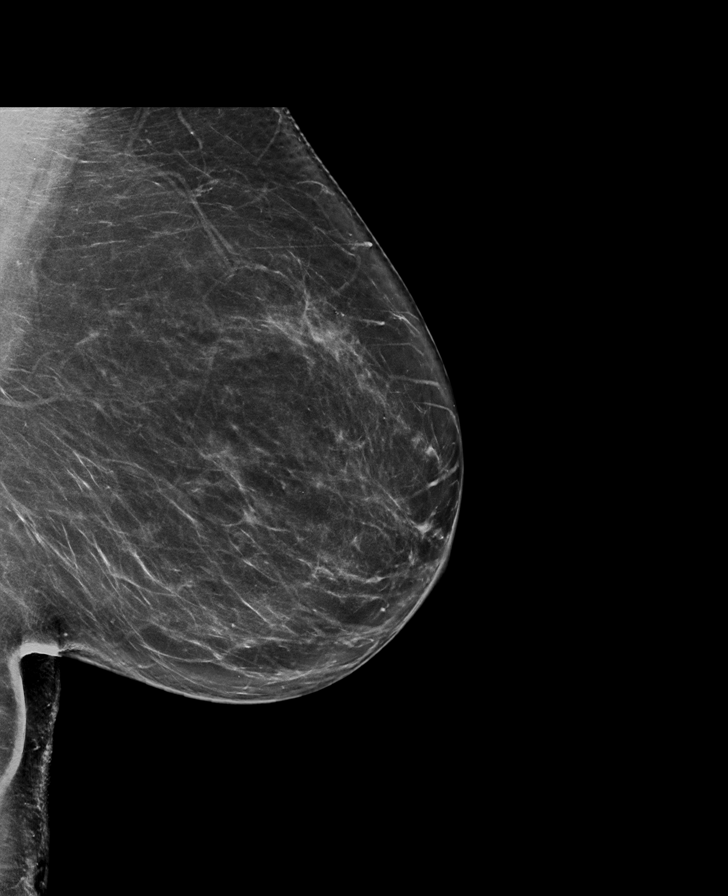

[R MLO synth-2D]
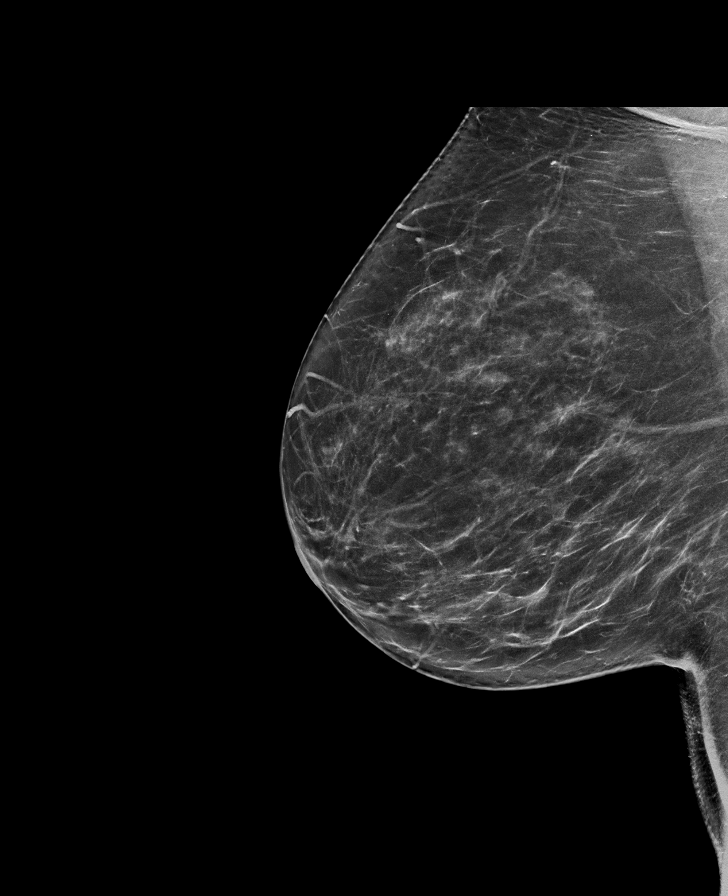

[R CC synth-2D]
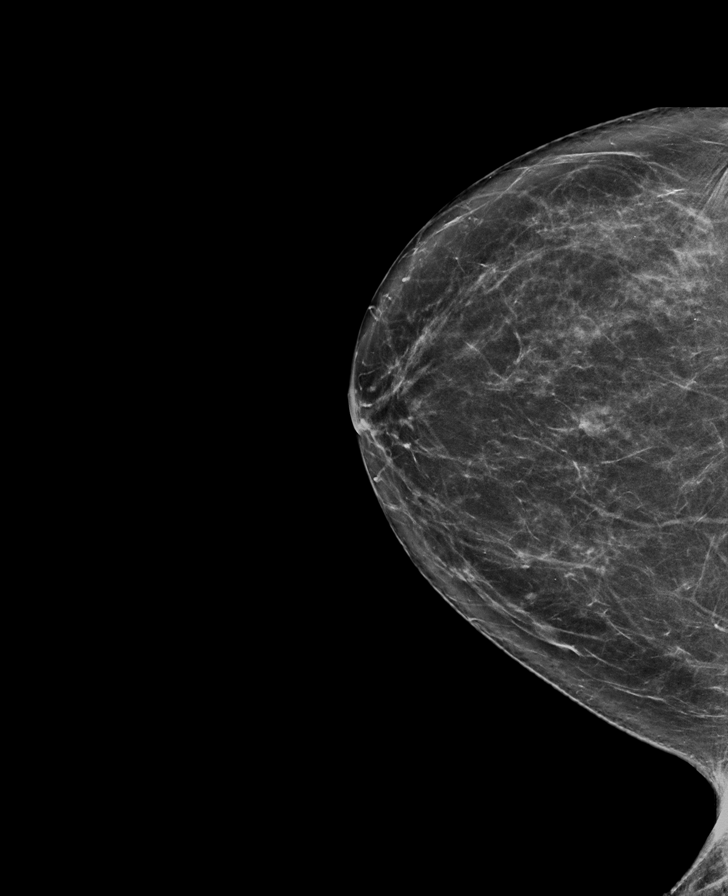

[L CC synth-2D]
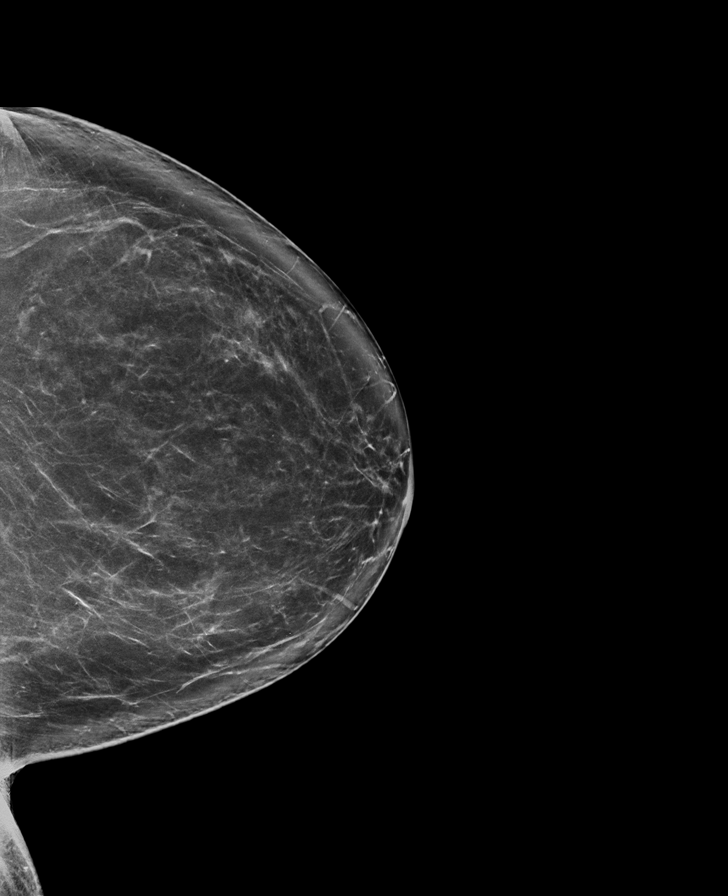

[R CC tomo · tomo slice 39/77.0]
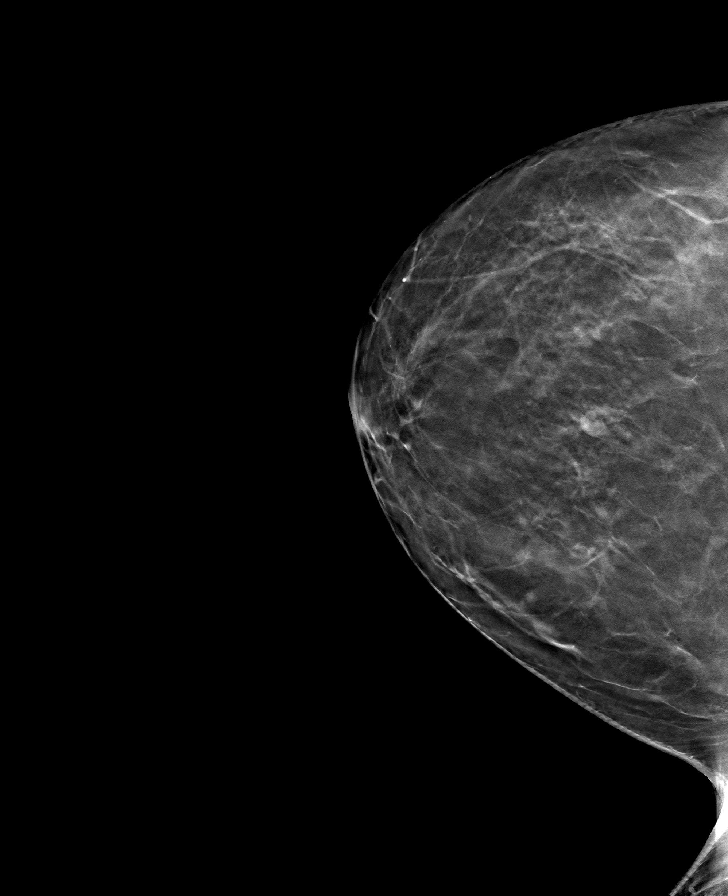

[L CC tomo · tomo slice 44/87.0]
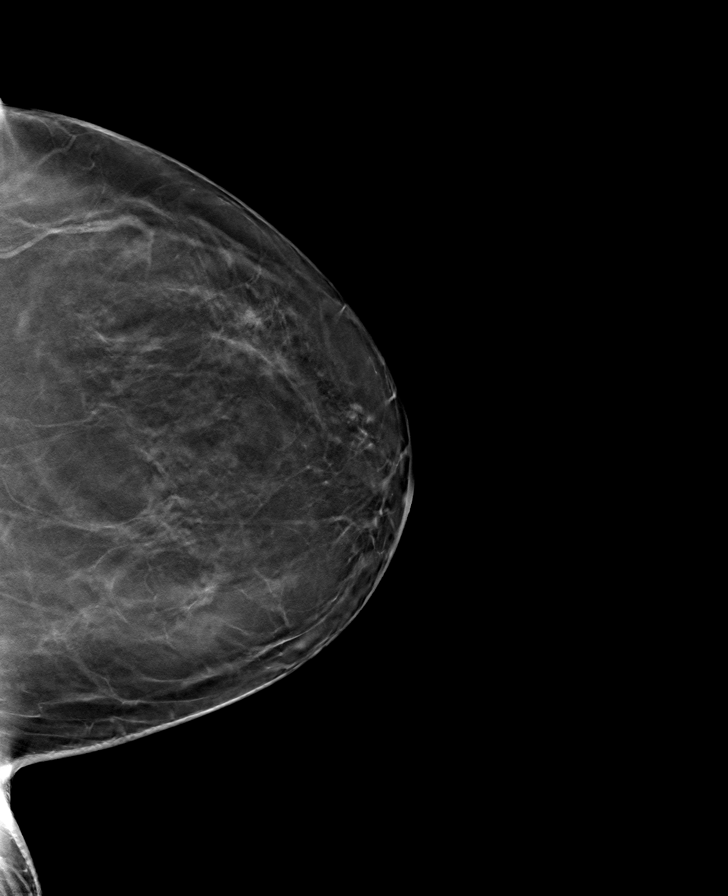

[L MLO tomo · tomo slice 43/86.0]
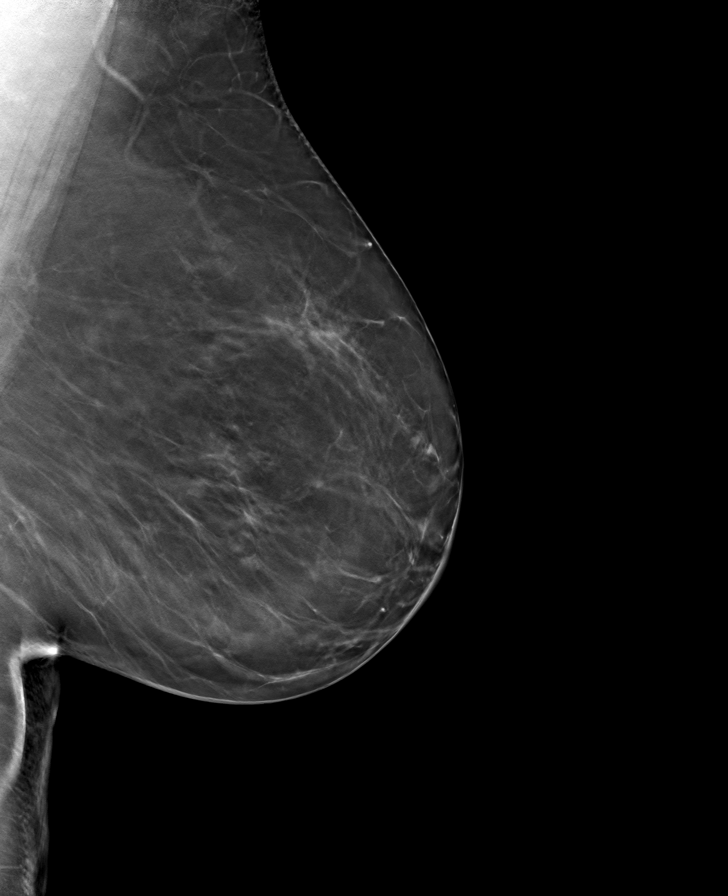

[R MLO tomo · tomo slice 41/80.0]
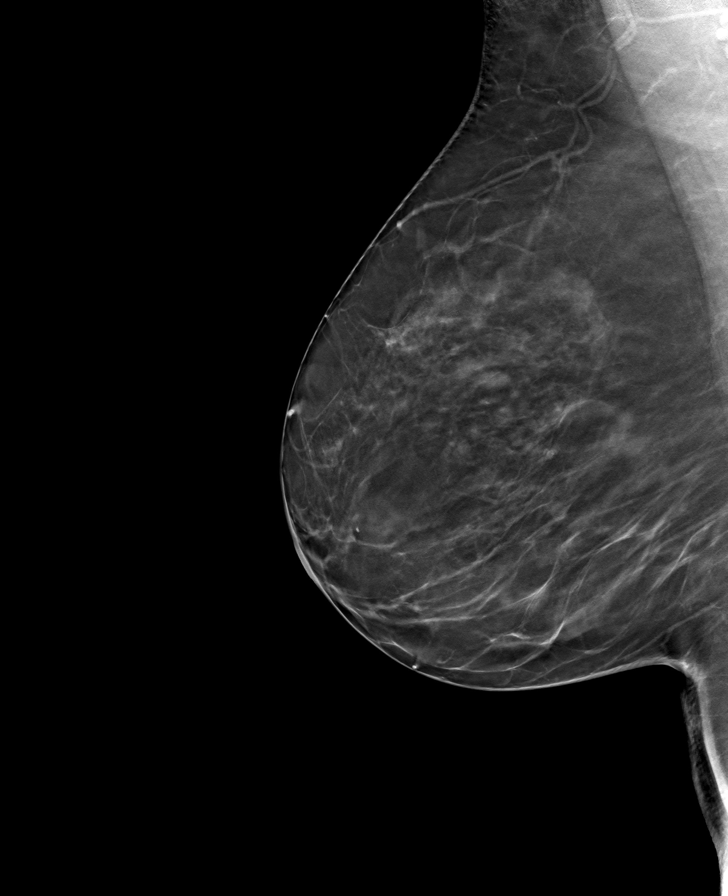

[8 of 24 positions shown; findings below may reference images not displayed]

ACR Breast Density Category b: There are scattered areas of
fibroglandular density.
FINDINGS: There are no findings suspicious for malignancy. Images were
processed with CAD.
IMPRESSION: No mammographic evidence of malignancy. A result letter of this
screening mammogram will be mailed directly to the patient.

RECOMMENDATION:
Screening mammogram in one year. (Code:[id number])

BI-RADS CATEGORY  1: Negative.

## 2020-01-03 DIAGNOSIS — L249 Irritant contact dermatitis, unspecified cause: Secondary | ICD-10-CM | POA: Diagnosis not present

## 2020-01-03 DIAGNOSIS — D485 Neoplasm of uncertain behavior of skin: Secondary | ICD-10-CM | POA: Diagnosis not present

## 2020-01-03 DIAGNOSIS — L821 Other seborrheic keratosis: Secondary | ICD-10-CM | POA: Diagnosis not present

## 2020-01-03 DIAGNOSIS — L820 Inflamed seborrheic keratosis: Secondary | ICD-10-CM | POA: Diagnosis not present

## 2020-01-08 DIAGNOSIS — R87810 Cervical high risk human papillomavirus (HPV) DNA test positive: Secondary | ICD-10-CM | POA: Insufficient documentation

## 2020-01-08 DIAGNOSIS — N952 Postmenopausal atrophic vaginitis: Secondary | ICD-10-CM | POA: Diagnosis not present

## 2020-01-08 DIAGNOSIS — Z1151 Encounter for screening for human papillomavirus (HPV): Secondary | ICD-10-CM | POA: Diagnosis not present

## 2020-01-08 DIAGNOSIS — Z01419 Encounter for gynecological examination (general) (routine) without abnormal findings: Secondary | ICD-10-CM | POA: Diagnosis not present

## 2020-01-16 ENCOUNTER — Other Ambulatory Visit: Payer: Self-pay | Admitting: Family Medicine

## 2020-02-27 ENCOUNTER — Encounter: Payer: Self-pay | Admitting: Medical-Surgical

## 2020-02-27 ENCOUNTER — Telehealth: Payer: Self-pay

## 2020-02-27 NOTE — Telephone Encounter (Signed)
Pt lvm stating she has an URI with cough and wheezing. She does not think it's bronchitis. Can a Rx be sent in for her?  Called patient and scheduled MyChart visit for sx's with Christen Butter due to Dr. Anastasio Auerbach having no appts.

## 2020-02-28 ENCOUNTER — Telehealth (INDEPENDENT_AMBULATORY_CARE_PROVIDER_SITE_OTHER): Payer: BC Managed Care – PPO | Admitting: Medical-Surgical

## 2020-02-28 ENCOUNTER — Encounter: Payer: Self-pay | Admitting: Medical-Surgical

## 2020-02-28 DIAGNOSIS — J329 Chronic sinusitis, unspecified: Secondary | ICD-10-CM

## 2020-02-28 DIAGNOSIS — J40 Bronchitis, not specified as acute or chronic: Secondary | ICD-10-CM

## 2020-02-28 MED ORDER — AZITHROMYCIN 250 MG PO TABS: ORAL_TABLET | ORAL | 0 refills | Status: DC

## 2020-02-28 MED ORDER — PROAIR RESPICLICK 108 (90 BASE) MCG/ACT IN AEPB: 2.0000 | INHALATION_SPRAY | Freq: Four times a day (QID) | RESPIRATORY_TRACT | 2 refills | Status: DC | PRN

## 2020-02-28 MED ORDER — HYDROCODONE-HOMATROPINE 5-1.5 MG/5ML PO SYRP
5.0000 mL | ORAL_SOLUTION | Freq: Every evening | ORAL | 0 refills | Status: DC | PRN
Start: 2020-02-28 — End: 2020-05-20

## 2020-02-28 MED ORDER — PREDNISONE 50 MG PO TABS: 50.0000 mg | ORAL_TABLET | Freq: Every day | ORAL | 0 refills | Status: DC

## 2020-02-28 NOTE — Progress Notes (Signed)
Virtual Visit via Video Note  I connected with Stacy Preston on 02/28/20 at  8:10 AM EST by a video enabled telemedicine application and verified that I am speaking with the correct person using two identifiers.   I discussed the limitations of evaluation and management by telemedicine and the availability of in person appointments. The patient expressed understanding and agreed to proceed.  Patient location: home Provider locations: office  Subjective:    CC: upper respiratory s/s  HPI: Stacy Preston 60 year old female presenting via MyChart video visit with reports of congestion, fatigue, and cough since 12/20.  Notes that she has some burning in her mid/upper chest along with intermittent wheezing.  Her congestion is sinus and she does have a cough but it is nonproductive.  She does have some postnasal drip and has been having chills.  No documented fevers and denies sinus tenderness.  Nasal discharge is clear at this time.  Notes that her symptoms always happen the day after she goes to the nail salon and gets her nails done.  She did send information via MyChart yesterday regarding "nail dip flu" that she found on Google.  Notes that she is not going to go back to have her nails done anymore.  She has a prescription for albuterol but does not have an inhaler at home so has not been able to try it.  Has been taking Tylenol Cold and flu over-the-counter with little to no benefit.  Having difficulty sleeping at night because of her cough.  Denies shortness of breath, chest pain, and GI symptoms.  Has a history of asthmatic bronchitis.  Past medical history, Surgical history, Family history not pertinant except as noted below, Social history, Allergies, and medications have been entered into the medical record, reviewed, and corrections made.   Review of Systems: See HPI for pertinent positives and negatives.   Objective:    General: Speaking clearly in complete sentences without any shortness  of breath.  Alert and oriented x3.  Normal judgment. No apparent acute distress.  Impression and Recommendations:    1. Sinobronchitis With the length of time with symptoms, and lack of improvement, treating with azithromycin.  Since she has a history of asthmatic bronchitis, we will go ahead and do a burst of prednisone.  Refilling her albuterol for as needed use.  Discussed the nature of coughing and how it helps clear the lungs and keeps her from developing issues such as pneumonia.  Would prefer that she does cough during the day to keep her airways clear but we will send in a very small supply of Hycodan cough syrup to use at night so she can sleep.  I discussed the assessment and treatment plan with the patient. The patient was provided an opportunity to ask questions and all were answered. The patient agreed with the plan and demonstrated an understanding of the instructions.   The patient was advised to call back or seek an in-person evaluation if the symptoms worsen or if the condition fails to improve as anticipated.  20 minutes of non-face-to-face time was provided during this encounter.  Return if symptoms worsen or fail to improve.  Thayer Ohm, DNP, APRN, FNP-BC [facility]

## 2020-02-29 ENCOUNTER — Telehealth: Payer: Self-pay

## 2020-02-29 NOTE — Telephone Encounter (Signed)
Fax received from pharmacy stating:  [facility]: Can you send a new Rx for Proventil HFA instead? It is much cheaper for the patient.    Hydrocodone-homatropine 5-1.5 mg/5 mL: we are out of stock on this med until Monday.

## 2020-02-29 NOTE — Telephone Encounter (Signed)
Perfect. Thanks.

## 2020-02-29 NOTE — Telephone Encounter (Signed)
Resending the albuterol inhaler as requested.  Can you please call the pharmacy to see what cough syrups they have in their prescription strength (Robitussin-AC, Tussionex, etc.)?

## 2020-02-29 NOTE — Telephone Encounter (Signed)
Spoke with pharmacy to find out what they do have in stock and they said that the pt has already picked up the Rx. They said that there must have been a crossing issue between them sending the fax and her getting the Rx.

## 2020-03-05 ENCOUNTER — Encounter: Payer: Self-pay | Admitting: Medical-Surgical

## 2020-03-07 ENCOUNTER — Ambulatory Visit (INDEPENDENT_AMBULATORY_CARE_PROVIDER_SITE_OTHER): Payer: BC Managed Care – PPO

## 2020-03-07 ENCOUNTER — Ambulatory Visit: Payer: BC Managed Care – PPO | Admitting: Family Medicine

## 2020-03-07 ENCOUNTER — Other Ambulatory Visit: Payer: Self-pay

## 2020-03-07 ENCOUNTER — Encounter: Payer: Self-pay | Admitting: Family Medicine

## 2020-03-07 ENCOUNTER — Ambulatory Visit: Payer: Self-pay

## 2020-03-07 VITALS — BP 134/78 | HR 94 | Ht 61.42 in | Wt 193.4 lb

## 2020-03-07 DIAGNOSIS — M5416 Radiculopathy, lumbar region: Secondary | ICD-10-CM

## 2020-03-07 DIAGNOSIS — M545 Low back pain, unspecified: Secondary | ICD-10-CM | POA: Diagnosis not present

## 2020-03-07 DIAGNOSIS — M25562 Pain in left knee: Secondary | ICD-10-CM | POA: Diagnosis not present

## 2020-03-07 MED ORDER — PREDNISONE 50 MG PO TABS: 50.0000 mg | ORAL_TABLET | Freq: Every day | ORAL | 0 refills | Status: DC

## 2020-03-07 MED ORDER — GABAPENTIN 300 MG PO CAPS: 300.0000 mg | ORAL_CAPSULE | Freq: Three times a day (TID) | ORAL | 3 refills | Status: DC | PRN

## 2020-03-07 NOTE — Patient Instructions (Signed)
Thank you for coming in today.  Call or go to the ER if you develop a large red swollen joint with extreme pain or oozing puss.   Please get an Xray today before you leave  Take the gabapentin mostly at bedtime as needed.   If not improving take the prednisone.   Let me know if this is not improving.   We may consider PT or other imaging like MRI.

## 2020-03-07 NOTE — Progress Notes (Signed)
I, [name], LAT, ATC acting as a scribe for [name], MD.  Subjective:    CC: Chronic left knee pain  HPI: Pt is a 60 y/o female presenting w/ chronic L knee pain since 02/21/20.  She previously saw Dr. Georgina Snell at [facility] on 10/17/18 for L 5th toe fx and L knee pain and swelling. MOI: Pt [place] + [place] to the beach and the next morning experienced bilat knee pain. Right knee pain improved, but L knee is still painful. Pt locates pain to anterior aspect of L knee that radiates up into groin and down leg. Pt reports she was in tears of pain this morning.  L knee swelling: no L knee mechanical symptoms: yes Aggravates: sitting Rx tried: Voltaren gel, IBU  Diagnostic testing: 09/26/18 R and L knee XR  Pertinent review of Systems: No fevers or chills  Relevant historical information: History of prolactinoma.   Objective:    Vitals:   03/07/20 1119  BP: 134/78  Pulse: 94  SpO2: 95%   General: Well Developed, well nourished, and in no acute distress.   MSK: L-spine normal-appearing nontender to midline.  Decreased lumbar motion.  Positive left-sided slump test.  Lower extremity strength is intact.  Reflexes are intact bilateral lower extremities.  Left knee normal-appearing Tender palpation anterior knee. Decreased motion 5-100 degrees. Pain with resisted extension however strength is intact to flexion and extension. Guarding with ligament exam stability testing nondiagnostic.  No frank laxity appreciated.    Lab and Radiology Results  Procedure: Real-time Ultrasound Guided Injection of left knee superior lateral patellar space Device: Philips Affiniti 50G Images permanently stored and available for review in PACS Ultrasound inspection prior to injection reveals moderate joint effusion.  Narrowed medial and lateral joint lines.  No Baker's cyst. Verbal informed consent obtained.  Discussed risks and benefits of procedure. Warned about infection  bleeding damage to structures skin hypopigmentation and fat atrophy among others. Patient expresses understanding and agreement Time-out conducted.   Noted no overlying erythema, induration, or other signs of local infection.   Skin prepped in a sterile fashion.   Local anesthesia: Topical Ethyl chloride.   With sterile technique and under real time ultrasound guidance:  40 mg of Kenalog and 2 mL of Marcaine injected into knee joint. Fluid seen entering the capsule.   Completed without difficulty   Pain moderately resolved suggesting accurate placement of the medication.   Advised to call if fevers/chills, erythema, induration, drainage, or persistent bleeding.   Images permanently stored and available for review in the ultrasound unit.  Impression: Technically successful ultrasound guided injection.     X-ray images L-spine obtained today personally and independently interpreted Significant DDD L4-5 and L5-S1.  No fractures or malalignment. Await formal radiology review  EXAM: LEFT KNEE - COMPLETE 4+ VIEW  COMPARISON:  Right knee radiographs 09/26/2018  FINDINGS: No fracture or malalignment. Minimal patellofemoral and medial joint space degenerative change. Small knee effusion.  IMPRESSION: 1. Mild degenerative changes with knee effusion. 2. No acute osseous abnormality   Electronically Signed   By: Donavan Foil M.D.   On: 09/26/2018 23:40 I, [name], personally (independently) visualized and performed the interpretation of the images attached in this note.    Impression and Recommendations:    Assessment and Plan: 60 y.o. female with left knee and leg pain.  Multifactorial and etiology somewhat unclear.  Patient does have knee pain and effusion and some dysfunction.  However she has pain beyond the  knee both proximally and distally in a L4 or L3 dermatomal pattern.  This indicates that she may have lumbar radiculopathy as well.  Plan to treat for both knee  arthritis exacerbation and lumbar radiculopathy.  For knee arthritis or knee pain we will proceed with steroid injection as above and Voltaren gel.  For lumbar radiculopathy will prescribe gabapentin and a backup course of prednisone to take if not improving.  If still not improving consider physical therapy and/or eventually MRI of the lumbar spine.Marland Kitchen  PDMP not reviewed this encounter. Orders Placed This Encounter  Procedures  . [place] LIMITED JOINT SPACE STRUCTURES LOW LEFT(NO LINKED CHARGES)    Standing Status:   Future    Number of Occurrences:   1    Standing Expiration Date:   09/04/2020    Order Specific Question:   Reason for Exam (SYMPTOM  OR DIAGNOSIS REQUIRED)    Answer:   left knee pain    Order Specific Question:   Preferred imaging location?    Answer:   [facility]  . DG Lumbar Spine 2-3 Views    Standing Status:   Future    Number of Occurrences:   1    Standing Expiration Date:   03/07/2021    Order Specific Question:   Reason for Exam (SYMPTOM  OR DIAGNOSIS REQUIRED)    Answer:   eval low back pain    Order Specific Question:   Is patient pregnant?    Answer:   No    Order Specific Question:   Preferred imaging location?    Answer:   [facility]   Meds ordered this encounter  Medications  . predniSONE (DELTASONE) 50 MG tablet    Sig: Take 1 tablet (50 mg total) by mouth daily.    Dispense:  5 tablet    Refill:  0  . gabapentin (NEURONTIN) 300 MG capsule    Sig: Take 1 capsule (300 mg total) by mouth 3 (three) times daily as needed.    Dispense:  90 capsule    Refill:  3    Discussed warning signs or symptoms. Please see discharge instructions. Patient expresses understanding.   The above documentation has been reviewed and is accurate and complete [name], M.D.

## 2020-03-08 NOTE — Progress Notes (Signed)
Possible compression fracture at T12. You are not painful there and this would not cause your leg symptoms. I do not think this is  clinically significant findings.   There is new arthritis in the lumbar spine ar L4-L5 that could possibly cause your leg pain.

## 2020-03-22 ENCOUNTER — Encounter: Payer: Self-pay | Admitting: Family Medicine

## 2020-03-26 NOTE — Progress Notes (Signed)
Billing issue was resolved.

## 2020-04-17 DIAGNOSIS — M5416 Radiculopathy, lumbar region: Secondary | ICD-10-CM | POA: Diagnosis not present

## 2020-04-17 DIAGNOSIS — M25562 Pain in left knee: Secondary | ICD-10-CM | POA: Diagnosis not present

## 2020-04-17 DIAGNOSIS — M25561 Pain in right knee: Secondary | ICD-10-CM | POA: Diagnosis not present

## 2020-04-17 DIAGNOSIS — M62830 Muscle spasm of back: Secondary | ICD-10-CM | POA: Diagnosis not present

## 2020-04-21 DIAGNOSIS — M62830 Muscle spasm of back: Secondary | ICD-10-CM | POA: Diagnosis not present

## 2020-04-21 DIAGNOSIS — M25562 Pain in left knee: Secondary | ICD-10-CM | POA: Diagnosis not present

## 2020-04-21 DIAGNOSIS — M25561 Pain in right knee: Secondary | ICD-10-CM | POA: Diagnosis not present

## 2020-04-21 DIAGNOSIS — M5416 Radiculopathy, lumbar region: Secondary | ICD-10-CM | POA: Diagnosis not present

## 2020-04-23 DIAGNOSIS — M25561 Pain in right knee: Secondary | ICD-10-CM | POA: Diagnosis not present

## 2020-04-23 DIAGNOSIS — M62830 Muscle spasm of back: Secondary | ICD-10-CM | POA: Diagnosis not present

## 2020-04-23 DIAGNOSIS — M25562 Pain in left knee: Secondary | ICD-10-CM | POA: Diagnosis not present

## 2020-04-23 DIAGNOSIS — M5416 Radiculopathy, lumbar region: Secondary | ICD-10-CM | POA: Diagnosis not present

## 2020-04-24 DIAGNOSIS — R102 Pelvic and perineal pain: Secondary | ICD-10-CM | POA: Insufficient documentation

## 2020-04-24 DIAGNOSIS — N83311 Acquired atrophy of right ovary: Secondary | ICD-10-CM | POA: Diagnosis not present

## 2020-04-24 DIAGNOSIS — N83312 Acquired atrophy of left ovary: Secondary | ICD-10-CM | POA: Diagnosis not present

## 2020-04-24 DIAGNOSIS — N898 Other specified noninflammatory disorders of vagina: Secondary | ICD-10-CM | POA: Diagnosis not present

## 2020-04-24 DIAGNOSIS — N950 Postmenopausal bleeding: Secondary | ICD-10-CM | POA: Diagnosis not present

## 2020-04-30 DIAGNOSIS — M62830 Muscle spasm of back: Secondary | ICD-10-CM | POA: Diagnosis not present

## 2020-04-30 DIAGNOSIS — M25561 Pain in right knee: Secondary | ICD-10-CM | POA: Diagnosis not present

## 2020-04-30 DIAGNOSIS — M25562 Pain in left knee: Secondary | ICD-10-CM | POA: Diagnosis not present

## 2020-04-30 DIAGNOSIS — M5416 Radiculopathy, lumbar region: Secondary | ICD-10-CM | POA: Diagnosis not present

## 2020-05-01 DIAGNOSIS — M5416 Radiculopathy, lumbar region: Secondary | ICD-10-CM | POA: Diagnosis not present

## 2020-05-01 DIAGNOSIS — M25562 Pain in left knee: Secondary | ICD-10-CM | POA: Diagnosis not present

## 2020-05-01 DIAGNOSIS — M25561 Pain in right knee: Secondary | ICD-10-CM | POA: Diagnosis not present

## 2020-05-01 DIAGNOSIS — M62830 Muscle spasm of back: Secondary | ICD-10-CM | POA: Diagnosis not present

## 2020-05-05 DIAGNOSIS — R102 Pelvic and perineal pain: Secondary | ICD-10-CM | POA: Diagnosis not present

## 2020-05-05 DIAGNOSIS — R1032 Left lower quadrant pain: Secondary | ICD-10-CM | POA: Diagnosis not present

## 2020-05-05 DIAGNOSIS — N952 Postmenopausal atrophic vaginitis: Secondary | ICD-10-CM | POA: Diagnosis not present

## 2020-05-05 DIAGNOSIS — N882 Stricture and stenosis of cervix uteri: Secondary | ICD-10-CM | POA: Insufficient documentation

## 2020-05-05 DIAGNOSIS — N950 Postmenopausal bleeding: Secondary | ICD-10-CM | POA: Diagnosis not present

## 2020-05-06 DIAGNOSIS — M25562 Pain in left knee: Secondary | ICD-10-CM | POA: Diagnosis not present

## 2020-05-06 DIAGNOSIS — M5416 Radiculopathy, lumbar region: Secondary | ICD-10-CM | POA: Diagnosis not present

## 2020-05-06 DIAGNOSIS — M62830 Muscle spasm of back: Secondary | ICD-10-CM | POA: Diagnosis not present

## 2020-05-06 DIAGNOSIS — M25561 Pain in right knee: Secondary | ICD-10-CM | POA: Diagnosis not present

## 2020-05-07 DIAGNOSIS — M25562 Pain in left knee: Secondary | ICD-10-CM | POA: Diagnosis not present

## 2020-05-07 DIAGNOSIS — M25561 Pain in right knee: Secondary | ICD-10-CM | POA: Diagnosis not present

## 2020-05-07 DIAGNOSIS — M62830 Muscle spasm of back: Secondary | ICD-10-CM | POA: Diagnosis not present

## 2020-05-07 DIAGNOSIS — M5416 Radiculopathy, lumbar region: Secondary | ICD-10-CM | POA: Diagnosis not present

## 2020-05-13 DIAGNOSIS — M62830 Muscle spasm of back: Secondary | ICD-10-CM | POA: Diagnosis not present

## 2020-05-13 DIAGNOSIS — M25562 Pain in left knee: Secondary | ICD-10-CM | POA: Diagnosis not present

## 2020-05-13 DIAGNOSIS — M25561 Pain in right knee: Secondary | ICD-10-CM | POA: Diagnosis not present

## 2020-05-13 DIAGNOSIS — M5416 Radiculopathy, lumbar region: Secondary | ICD-10-CM | POA: Diagnosis not present

## 2020-05-14 DIAGNOSIS — M25561 Pain in right knee: Secondary | ICD-10-CM | POA: Diagnosis not present

## 2020-05-14 DIAGNOSIS — M62830 Muscle spasm of back: Secondary | ICD-10-CM | POA: Diagnosis not present

## 2020-05-14 DIAGNOSIS — M5416 Radiculopathy, lumbar region: Secondary | ICD-10-CM | POA: Diagnosis not present

## 2020-05-14 DIAGNOSIS — M25562 Pain in left knee: Secondary | ICD-10-CM | POA: Diagnosis not present

## 2020-05-15 DIAGNOSIS — K5790 Diverticulosis of intestine, part unspecified, without perforation or abscess without bleeding: Secondary | ICD-10-CM | POA: Diagnosis not present

## 2020-05-15 DIAGNOSIS — K7689 Other specified diseases of liver: Secondary | ICD-10-CM | POA: Diagnosis not present

## 2020-05-15 DIAGNOSIS — R1032 Left lower quadrant pain: Secondary | ICD-10-CM | POA: Diagnosis not present

## 2020-05-15 DIAGNOSIS — K5730 Diverticulosis of large intestine without perforation or abscess without bleeding: Secondary | ICD-10-CM | POA: Diagnosis not present

## 2020-05-15 DIAGNOSIS — N3289 Other specified disorders of bladder: Secondary | ICD-10-CM | POA: Diagnosis not present

## 2020-05-15 DIAGNOSIS — M47816 Spondylosis without myelopathy or radiculopathy, lumbar region: Secondary | ICD-10-CM | POA: Diagnosis not present

## 2020-05-16 ENCOUNTER — Telehealth: Payer: Self-pay

## 2020-05-16 NOTE — Telephone Encounter (Signed)
Please contact the patient to schedule an appt per Dr. West Pugh note.   Dr. Zigmund Daniel: Recommend follow up appt with me to discuss this since I was not the ordering physician on this test.

## 2020-05-16 NOTE — Telephone Encounter (Signed)
Pt lvm stating recent CT w/ contrast ordered by OB/GYN. Results: diverticulosis.   Requesting a phone call from Dr. Zigmund Daniel to discuss next steps.

## 2020-05-16 NOTE — Telephone Encounter (Signed)
Patient is scheduled for 05/20/2020. Patient wanted to make sure Dr Zigmund Daniel could see and view the CT scan in our system, I told patient I would send message about that. Also stating she is still having knee problems and that Dr. Georgina Snell has all the information about that. AM

## 2020-05-16 NOTE — Telephone Encounter (Signed)
Recommend follow up appt with me to discuss this since I was not the ordering physician on this test.

## 2020-05-19 NOTE — Telephone Encounter (Signed)
Ms. Stacy Preston has been advised concerning viewing the results but not the actual images. She's ok with that.

## 2020-05-19 NOTE — Telephone Encounter (Signed)
I can view result but can't see actual images.

## 2020-05-20 ENCOUNTER — Ambulatory Visit (INDEPENDENT_AMBULATORY_CARE_PROVIDER_SITE_OTHER): Payer: BC Managed Care – PPO | Admitting: Family Medicine

## 2020-05-20 ENCOUNTER — Encounter: Payer: Self-pay | Admitting: Family Medicine

## 2020-05-20 ENCOUNTER — Other Ambulatory Visit: Payer: Self-pay

## 2020-05-20 VITALS — BP 150/67 | HR 79 | Temp 98.5°F | Ht 61.32 in | Wt 192.1 lb

## 2020-05-20 DIAGNOSIS — M25561 Pain in right knee: Secondary | ICD-10-CM | POA: Diagnosis not present

## 2020-05-20 DIAGNOSIS — Z1211 Encounter for screening for malignant neoplasm of colon: Secondary | ICD-10-CM | POA: Diagnosis not present

## 2020-05-20 DIAGNOSIS — R1030 Lower abdominal pain, unspecified: Secondary | ICD-10-CM | POA: Diagnosis not present

## 2020-05-20 DIAGNOSIS — M5416 Radiculopathy, lumbar region: Secondary | ICD-10-CM

## 2020-05-20 DIAGNOSIS — M25569 Pain in unspecified knee: Secondary | ICD-10-CM | POA: Insufficient documentation

## 2020-05-20 DIAGNOSIS — G8929 Other chronic pain: Secondary | ICD-10-CM

## 2020-05-20 DIAGNOSIS — M25562 Pain in left knee: Secondary | ICD-10-CM

## 2020-05-20 MED ORDER — DICYCLOMINE HCL 10 MG PO CAPS: 10.0000 mg | ORAL_CAPSULE | Freq: Three times a day (TID) | ORAL | 1 refills | Status: DC

## 2020-05-20 NOTE — Assessment & Plan Note (Signed)
She has lumbar radicular pain with history of T12 compression fracture.  This may be causing some of her sensation of abdominal pain.  I have ordered MRI of her lumbar spine.

## 2020-05-20 NOTE — Assessment & Plan Note (Signed)
Given samples of Pennsaid to try to the knee.  She will let me know if this is helpful.

## 2020-05-20 NOTE — Patient Instructions (Signed)
Try pennsaid to knee.  Try bentyl as needed for stomach pain You'll get a call to set up colonoscopy and MRI

## 2020-05-20 NOTE — Progress Notes (Signed)
Stacy Preston - 60 y.o. female MRN [id number]  Date of birth: 1960-03-16  Subjective Chief Complaint  Patient presents with  . Knee Pain  . Abdominal Pain    HPI Stacy Preston is a 59 y.o. female here today for follow-up of abdominal pain.  She has had lower abdominal and pelvic pain over the past few months.  She did see her GYN and work-up for pelvic/gynecologic etiology was unremarkable.  She did have a CT scan of her abdomen showing diverticulosis without diverticulitis.  She would like to discuss these results.  She is also noted to have remote T12 compression fracture.  She continues to have lower abdominal pain and she is also having pain in her lower back that radiates down her legs, particularly the left leg.  She denies any numbness, tingling or weakness.  She reports that her bowels are moving normally, appetite is normal and she denies nausea.  Her pain does seem to worsen some if she has a bowel movement.  She is overdue for colon cancer screening and requests referral for this.  She is also having some continued knee pain.  She is seeing Dr. Denyse Amass for this previously and had injection.  This was helpful for a few days however did not resolve.  She has tried Voltaren gel with minor improvement.  She has never tried Pennsaid.  ROS:  A comprehensive ROS was completed and negative except as noted per HPI  Allergies  Allergen Reactions  . Lactose Intolerance (Gi)     Past Medical History:  Diagnosis Date  . History of basal cell carcinoma 01/09/2016  . Hypertension   . Infertility, female   . Prolactinoma (HCC)   . Vitamin D deficiency 01/09/2016    Past Surgical History:  Procedure Laterality Date  . ivf    . PLANTAR FASCIA RELEASE    . PLANTAR FASCIA SURGERY      Social History   Socioeconomic History  . Marital status: Married    Spouse name: Not on file  . Number of children: Not on file  . Years of education: Not on file  . Highest education level: Not  on file  Occupational History  . Not on file  Tobacco Use  . Smoking status: Never Smoker  . Smokeless tobacco: Never Used  Vaping Use  . Vaping Use: Never used  Substance and Sexual Activity  . Alcohol use: Yes  . Drug use: No  . Sexual activity: Yes    Birth control/protection: None  Other Topics Concern  . Not on file  Social History Narrative  . Not on file   Social Determinants of Health   Financial Resource Strain: Not on file  Food Insecurity: Not on file  Transportation Needs: Not on file  Physical Activity: Not on file  Stress: Not on file  Social Connections: Not on file    Family History  Problem Relation Age of Onset  . Renal Disease Mother   . Diabetes Mother   . Hypertension Mother   . Cancer Father        Lung CA  . Alcohol abuse Brother   . Hypertension Brother   . Sickle cell anemia Brother   . Cancer Brother   . Diabetes Maternal Aunt   . Heart disease Maternal Grandmother   . Diabetes Maternal Grandmother   . Cancer Maternal Grandfather   . Diabetes Maternal Grandfather   . Stroke Paternal Grandmother   . Heart disease Paternal Grandfather  Health Maintenance  Topic Date Due  . COLONOSCOPY (Pts 45-42yrs Insurance coverage will need to be confirmed)  03/05/2020  . COVID-19 Vaccine (4 - Booster for Pfizer series) 07/06/2020  . MAMMOGRAM  12/16/2021  . PAP SMEAR-Modifier  12/20/2022  . TETANUS/TDAP  01/08/2026  . INFLUENZA VACCINE  Completed  . Hepatitis C Screening  Completed  . HIV Screening  Completed  . HPV VACCINES  Aged Out     ----------------------------------------------------------------------------------------------------------------------------------------------------------------------------------------------------------------- Physical Exam BP (!) 150/67 (BP Location: Left Arm, Patient Position: Sitting, Cuff Size: Large)   Pulse 79   Temp 98.5 F (36.9 C)   Ht 5' 1.32" (1.558 m)   Wt 192 lb 1.6 oz (87.1 kg)   SpO2  99%   BMI 35.92 kg/m   Physical Exam Constitutional:      Appearance: She is well-developed.  HENT:     Head: Normocephalic and atraumatic.  Eyes:     General: No scleral icterus. Cardiovascular:     Rate and Rhythm: Normal rate and regular rhythm.  Pulmonary:     Effort: Pulmonary effort is normal.     Breath sounds: Normal breath sounds.  Abdominal:     General: Abdomen is flat. There is no distension.     Palpations: Abdomen is soft.     Tenderness: There is no abdominal tenderness.  Neurological:     Mental Status: She is alert.  Psychiatric:        Mood and Affect: Mood normal.        Behavior: Behavior normal.     ------------------------------------------------------------------------------------------------------------------------------------------------------------------------------------------------------------------- Assessment and Plan  Lumbar radiculopathy She has lumbar radicular pain with history of T12 compression fracture.  This may be causing some of her sensation of abdominal pain.  I have ordered MRI of her lumbar spine.  Lower abdominal pain Recent CT scan report reviewed and relatively unremarkable.  Discussed that diverticulosis is not uncommon.  There is no evidence of diverticulitis noted on CT scan.  She is due for colon cancer screening and I have placed referral for this.  Will provide trial of dicyclomine as needed to see if this may help with some of the abdominal pain she is having.  Knee pain Given samples of Pennsaid to try to the knee.  She will let me know if this is helpful.   Meds ordered this encounter  Medications  . dicyclomine (BENTYL) 10 MG capsule    Sig: Take 1 capsule (10 mg total) by mouth 4 (four) times daily -  before meals and at bedtime.    Dispense:  30 capsule    Refill:  1    No follow-ups on file.    This visit occurred during the SARS-CoV-2 public health emergency.  Safety protocols were in place, including  screening questions prior to the visit, additional usage of staff PPE, and extensive cleaning of exam room while observing appropriate contact time as indicated for disinfecting solutions.

## 2020-05-20 NOTE — Assessment & Plan Note (Signed)
Recent CT scan report reviewed and relatively unremarkable.  Discussed that diverticulosis is not uncommon.  There is no evidence of diverticulitis noted on CT scan.  She is due for colon cancer screening and I have placed referral for this.  Will provide trial of dicyclomine as needed to see if this may help with some of the abdominal pain she is having.

## 2020-05-21 ENCOUNTER — Encounter: Payer: Self-pay | Admitting: Family Medicine

## 2020-05-21 ENCOUNTER — Telehealth: Payer: Self-pay | Admitting: Gastroenterology

## 2020-05-21 NOTE — Telephone Encounter (Signed)
Hi Dr. Bryan Lemma,  We received a referral from PCP for patient to get another colonoscopy. Patient had one done back in 2017 reports is available for review in Epic.   It appears to have been a normal.  Please advise on scheduling. Thank you

## 2020-05-22 NOTE — Telephone Encounter (Signed)
Available records reviewed and notable for the following:  She has a family history of colon cancer in her maternal grandfather -Colonoscopy 2005 unremarkable per patient -Colonoscopy 09/2011 in [place] notable for polyp (pathology unknown) -Colonoscopy in 2017 at [facility] normal.  Recommended repeat in 5 years due to prior history of polyps  Based on the limited available records, given previous Endoscopist's recommendation to repeat in 5 years, okay to repeat colonoscopy now for ongoing polyp surveillance.  If this colonoscopy is unremarkable, with 2 negative colonoscopies in a row, can likely liberalize screening/surveillance intervals moving forward.  Okay to schedule as direct access.

## 2020-05-23 ENCOUNTER — Telehealth: Payer: Self-pay

## 2020-05-23 NOTE — Telephone Encounter (Signed)
Patient had last colonoscopy in 2017 chart was given to Dr. Bryan Lemma for review because they are done every 10 years. - CF

## 2020-05-23 NOTE — Telephone Encounter (Signed)
Pt called and stated she has been getting the "run around" from [place] in regards to making an appointment for a colonoscopy.The referral was placed on the 29th. Not sure if this is a common thing or if there's something we can do to help her? Thanks!

## 2020-05-24 ENCOUNTER — Other Ambulatory Visit: Payer: Self-pay

## 2020-05-24 ENCOUNTER — Ambulatory Visit (INDEPENDENT_AMBULATORY_CARE_PROVIDER_SITE_OTHER): Payer: BC Managed Care – PPO

## 2020-05-24 DIAGNOSIS — M5416 Radiculopathy, lumbar region: Secondary | ICD-10-CM | POA: Diagnosis not present

## 2020-05-24 DIAGNOSIS — M79605 Pain in left leg: Secondary | ICD-10-CM

## 2020-05-24 DIAGNOSIS — M545 Low back pain, unspecified: Secondary | ICD-10-CM | POA: Diagnosis not present

## 2020-05-24 DIAGNOSIS — R1030 Lower abdominal pain, unspecified: Secondary | ICD-10-CM | POA: Diagnosis not present

## 2020-05-26 NOTE — Telephone Encounter (Signed)
Patient scheduled for 07/08/20.

## 2020-05-26 NOTE — Telephone Encounter (Signed)
Called patient to advise and schedule left voicemail. 

## 2020-05-27 ENCOUNTER — Encounter: Payer: Self-pay | Admitting: Family Medicine

## 2020-05-27 DIAGNOSIS — L578 Other skin changes due to chronic exposure to nonionizing radiation: Secondary | ICD-10-CM | POA: Diagnosis not present

## 2020-05-27 DIAGNOSIS — L820 Inflamed seborrheic keratosis: Secondary | ICD-10-CM | POA: Diagnosis not present

## 2020-05-27 DIAGNOSIS — L821 Other seborrheic keratosis: Secondary | ICD-10-CM | POA: Diagnosis not present

## 2020-05-27 NOTE — Telephone Encounter (Signed)
Please offer her one of the appt slots tomorrow for VV to review this.

## 2020-05-28 ENCOUNTER — Ambulatory Visit: Payer: BC Managed Care – PPO | Admitting: Family Medicine

## 2020-05-28 ENCOUNTER — Other Ambulatory Visit: Payer: Self-pay

## 2020-05-28 ENCOUNTER — Encounter: Payer: Self-pay | Admitting: Family Medicine

## 2020-05-28 VITALS — BP 142/80 | HR 107 | Temp 97.8°F | Ht 61.0 in | Wt 192.5 lb

## 2020-05-28 DIAGNOSIS — M5416 Radiculopathy, lumbar region: Secondary | ICD-10-CM

## 2020-05-28 DIAGNOSIS — M47816 Spondylosis without myelopathy or radiculopathy, lumbar region: Secondary | ICD-10-CM

## 2020-05-28 NOTE — Assessment & Plan Note (Signed)
She has multilevel spondylosis with mild to moderate stenosis.  I reviewed her MRI with her today.  We discussed trial of physical therapy initially.  We will add on gabapentin, which she already has a prescription from Dr. Georgina Snell previously.  The symptoms are not improving we can consider referral for injection.  I will plan to see her back in about 6 to 8 weeks.

## 2020-05-28 NOTE — Progress Notes (Signed)
Stacy Preston - 60 y.o. female MRN [id number]  Date of birth: [birth year]  Subjective Chief Complaint  Patient presents with  . Results    HPI Stacy Preston is a 60 y.o. female here today for follow up on lower abdomen/pelvic pain and radiation of pain down the back of the L leg.  Pain is intermittent in nature.  She had evaluation by GYN and had CT scan which did not show any obvious cause of her pelvic pain.   Discussed referred low back pain/lumbar radiculopathy as cause of her pelvic pain as well as radiating pain down the back of her leg.  Will obtain MRI of her lumbar spine and she is here to review these results today.  She continues to have pain which is causing some difficulty with sleep.  She denies any weakness, numbness or tingling.  She has not had any bowel or bladder dysfunction.  ROS:  A comprehensive ROS was completed and negative except as noted per HPI  Allergies  Allergen Reactions  . Lactose Intolerance (Gi)     Past Medical History:  Diagnosis Date  . History of basal cell carcinoma 01/09/2016  . Hypertension   . Infertility, female   . Prolactinoma (HCC)   . Vitamin D deficiency 01/09/2016    Past Surgical History:  Procedure Laterality Date  . ivf    . PLANTAR FASCIA RELEASE    . PLANTAR FASCIA SURGERY      Social History   Socioeconomic History  . Marital status: Married    Spouse name: Not on file  . Number of children: Not on file  . Years of education: Not on file  . Highest education level: Not on file  Occupational History  . Not on file  Tobacco Use  . Smoking status: Never Smoker  . Smokeless tobacco: Never Used  Vaping Use  . Vaping Use: Never used  Substance and Sexual Activity  . Alcohol use: Yes  . Drug use: No  . Sexual activity: Yes    Birth control/protection: None  Other Topics Concern  . Not on file  Social History Narrative  . Not on file   Social Determinants of Health   Financial Resource Strain: Not on file   Food Insecurity: Not on file  Transportation Needs: Not on file  Physical Activity: Not on file  Stress: Not on file  Social Connections: Not on file    Family History  Problem Relation Age of Onset  . Renal Disease Mother   . Diabetes Mother   . Hypertension Mother   . Cancer Father        Lung CA  . Alcohol abuse Brother   . Hypertension Brother   . Sickle cell anemia Brother   . Cancer Brother   . Diabetes Maternal Aunt   . Heart disease Maternal Grandmother   . Diabetes Maternal Grandmother   . Cancer Maternal Grandfather   . Diabetes Maternal Grandfather   . Stroke Paternal Grandmother   . Heart disease Paternal Grandfather     Health Maintenance  Topic Date Due  . COLONOSCOPY (Pts 45-51yrs Insurance coverage will need to be confirmed)  03/05/2020  . COVID-19 Vaccine (4 - Booster for Pfizer series) 07/06/2020  . INFLUENZA VACCINE  09/22/2020  . MAMMOGRAM  12/16/2021  . PAP SMEAR-Modifier  12/20/2022  . TETANUS/TDAP  01/08/2026  . Hepatitis C Screening  Completed  . HIV Screening  Completed  . HPV VACCINES  Aged Out     -----------------------------------------------------------------------------------------------------------------------------------------------------------------------------------------------------------------  Physical Exam BP (!) 142/80 (BP Location: Left Arm, Patient Position: Sitting, Cuff Size: Large)   Pulse (!) 107   Temp 97.8 F (36.6 C)   Ht 5\' 1"  (1.549 m)   Wt 192 lb 8 oz (87.3 kg)   SpO2 99%   BMI 36.37 kg/m   Physical Exam Constitutional:      Appearance: Normal appearance.  HENT:     Head: Normocephalic and atraumatic.  Eyes:     General: No scleral icterus. Musculoskeletal:     Cervical back: Neck supple.  Neurological:     General: No focal deficit present.     Mental Status: She is alert.  Psychiatric:        Mood and Affect: Mood normal.        Behavior: Behavior normal.      ------------------------------------------------------------------------------------------------------------------------------------------------------------------------------------------------------------------- Assessment and Plan  Lumbar radiculopathy  She has multilevel spondylosis with mild to moderate stenosis.  I reviewed her MRI with her today.  We discussed trial of physical therapy initially.  We will add on gabapentin, which she already has a prescription from Dr. Denyse Amass previously.  The symptoms are not improving we can consider referral for injection.  I will plan to see her back in about 6 to 8 weeks.    No orders of the defined types were placed in this encounter.   No follow-ups on file.    This visit occurred during the SARS-CoV-2 public health emergency.  Safety protocols were in place, including screening questions prior to the visit, additional usage of staff PPE, and extensive cleaning of exam room while observing appropriate contact time as indicated for disinfecting solutions.

## 2020-05-28 NOTE — Patient Instructions (Signed)

## 2020-06-04 ENCOUNTER — Other Ambulatory Visit: Payer: Self-pay

## 2020-06-04 ENCOUNTER — Encounter: Payer: Self-pay | Admitting: Rehabilitative and Restorative Service Providers"

## 2020-06-04 ENCOUNTER — Ambulatory Visit (INDEPENDENT_AMBULATORY_CARE_PROVIDER_SITE_OTHER): Payer: BC Managed Care – PPO | Admitting: Rehabilitative and Restorative Service Providers"

## 2020-06-04 DIAGNOSIS — M5416 Radiculopathy, lumbar region: Secondary | ICD-10-CM

## 2020-06-04 DIAGNOSIS — R29898 Other symptoms and signs involving the musculoskeletal system: Secondary | ICD-10-CM | POA: Diagnosis not present

## 2020-06-04 DIAGNOSIS — M6281 Muscle weakness (generalized): Secondary | ICD-10-CM

## 2020-06-04 NOTE — Patient Instructions (Addendum)
  TENS UNIT: This is helpful for muscle pain and spasm.   Search and Purchase a TENS 7000 2nd edition at www.tenspros.com. It should be less than $30.     TENS unit instructions: Do not shower or bathe with the unit on Turn the unit off before removing electrodes or batteries If the electrodes lose stickiness add a drop of water to the electrodes after they are disconnected from the unit and place on plastic sheet. If you continued to have difficulty, call the TENS unit company to purchase more electrodes. Do not apply lotion on the skin area prior to use. Make sure the skin is clean and dry as this will help prolong the life of the electrodes. After use, always check skin for unusual red areas, rash or other skin difficulties. If there are any skin problems, does not apply electrodes to the same area. Never remove the electrodes from the unit by pulling the wires. Do not use the TENS unit or electrodes other than as directed. Do not change electrode placement without consultating your therapist or physician. Keep 2 fingers with between each electrode.   Access Code: [email or web address]: [email or web address]: 04/13/2022Prepared by: Freyja Govea HoltExercises  Prone Press Up - 2 x daily - 7 x weekly - 1 sets - 10 reps - 2-3 sec hold  Prone Press Up On Elbows - 2 x daily - 7 x weekly - 1 sets - 3 reps - 30-60 sec hold  Supine Piriformis Stretch with Leg Straight - 2 x daily - 7 x weekly - 1 sets - 3 reps - 30 sec hold  Hooklying Hamstring Stretch with Strap - 2 x daily - 7 x weekly - 1 sets - 3 reps - 30 sec hold  Supine Sciatic Nerve Glide - 2 x daily - 7 x weekly - 1 sets - 8-10 reps - 1-2 sec hold

## 2020-06-04 NOTE — Therapy (Signed)
Helene Kelp PT, MPH  06/04/2020, 5:19 PM  [facility] [place], [place], [address] Phone: [phone]   Fax:  [phone]  Name: Stacy Preston MRN: [id number] Date of Birth: Mar 07, 1960  Helene Kelp PT, MPH  06/04/2020, 5:19 PM  [facility] [place], [place], [address] Phone: [phone]   Fax:  [phone]  Name: Stacy Preston MRN: [id number] Date of Birth: Mar 07, 1960  [place], [place], [address] Phone: [phone]   Fax:  [phone]  Physical Therapy Evaluation  Patient Details  Name: Stacy Preston MRN: [id number] Date of Birth: February 22, 1961 Referring Provider (PT): Dr Luetta Nutting   Encounter Date: 06/04/2020   PT End of Session - 06/04/20 1705    Visit Number 1    Number of Visits 12    Date for PT Re-Evaluation 07/16/20    PT Start Time 1600    PT Stop Time 1652    PT Time Calculation (min) 52 min    Activity Tolerance Patient tolerated treatment well           Past Medical History:  Diagnosis Date  . History of basal cell carcinoma 01/09/2016  . Hypertension   . Infertility, female   . Prolactinoma ([place])   . Vitamin D deficiency 01/09/2016    Past Surgical History:  Procedure Laterality Date  . ivf    . PLANTAR FASCIA RELEASE    . PLANTAR FASCIA SURGERY      There were no vitals filed for this visit.    Subjective Assessment - 06/04/20 1605    Subjective Patient reports that she has had LBP since 02/21/20 after she drove for several hours. She has had pain in the LB and down the Lt LE into the Lt calf. She had an injection in the Lt knee with no changes. She has sensation of stiffness in the Lt knee making it difficult to go up and down the steps.  She has had some pain in the Lt to middle anterior pelvic area. She has been cleared from the GYN.    Pertinent History cervical dysfunction; GI problems; HTN; plantar fasciitis bilat feet; laproscopies 3-5 times    Diagnostic tests MRI; CT: xrays    Patient Stated Goals get rid of pain in Lt leg    Currently in Pain? Yes    Pain Score 5     Pain Location Leg    Pain Orientation Left    Pain Descriptors / Indicators Burning;Stabbing    Pain Type Acute pain    Pain Radiating Towards posterior Lt hip; posterior thigh; knee;  calf    Pain Onset More than a month ago    Pain Frequency  Intermittent    Aggravating Factors  standing; walking; sitting; ADL's    Pain Relieving Factors moving; changing positions              [facility] PT Assessment - 06/04/20 0001      Assessment   Medical Diagnosis Lumbar dysfunction    Referring Provider (PT) Dr Luetta Nutting    Onset Date/Surgical Date 02/21/20    Hand Dominance Right    Next MD Visit to schedule    Prior Therapy here 2017 for cervical sdysfunction      Precautions   Precautions None      Restrictions   Weight Bearing Restrictions No      Balance Screen   Has the patient fallen in the past 6 months No    Has the patient had a decrease in activity level because of a fear of falling?  No    Is the patient reluctant to leave their home because of a fear of falling?  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

## 2020-06-09 ENCOUNTER — Encounter: Payer: Self-pay | Admitting: Physical Therapy

## 2020-06-09 ENCOUNTER — Other Ambulatory Visit: Payer: Self-pay

## 2020-06-09 ENCOUNTER — Ambulatory Visit (INDEPENDENT_AMBULATORY_CARE_PROVIDER_SITE_OTHER): Payer: BC Managed Care – PPO | Admitting: Physical Therapy

## 2020-06-09 DIAGNOSIS — M5416 Radiculopathy, lumbar region: Secondary | ICD-10-CM

## 2020-06-09 DIAGNOSIS — R29898 Other symptoms and signs involving the musculoskeletal system: Secondary | ICD-10-CM

## 2020-06-09 DIAGNOSIS — M6281 Muscle weakness (generalized): Secondary | ICD-10-CM

## 2020-06-09 NOTE — Therapy (Signed)
[place], [place], [address] Phone: [phone]   Fax:  [phone]  Physical Therapy Treatment  Patient Details  Name: Stacy Preston MRN: [id number] Date of Birth: 09/20/1960 Referring Provider (PT): Dr Luetta Nutting   Encounter Date: 06/09/2020   PT End of Session - 06/09/20 1512    Visit Number 2    Number of Visits 12    Date for PT Re-Evaluation 07/16/20    PT Start Time 1512    PT Stop Time 1607    PT Time Calculation (min) 55 min    Activity Tolerance Patient tolerated treatment well    Behavior During Therapy [facility] for tasks assessed/performed           Past Medical History:  Diagnosis Date  . History of basal cell carcinoma 01/09/2016  . Hypertension   . Infertility, female   . Prolactinoma ([place])   . Vitamin D deficiency 01/09/2016    Past Surgical History:  Procedure Laterality Date  . ivf    . PLANTAR FASCIA RELEASE    . PLANTAR FASCIA SURGERY      There were no vitals filed for this visit.   Subjective Assessment - 06/09/20 1512    Subjective Patient reporting pain, N/T with nerve glide and increased pain with with extension, but after pain was better. She did a lot of walking yesterday and that increased pain as well.    Pertinent History cervical dysfunction; GI problems; HTN; plantar fasciitis bilat feet; laproscopies 3-5 times    Diagnostic tests MRI; CT: xrays    Patient Stated Goals get rid of pain in Lt leg    Currently in Pain? No/denies                             [facility] Adult PT Treatment/Exercise - 06/09/20 0001      Lumbar Exercises: Stretches   Passive Hamstring Stretch Left;2 reps;30 seconds   supine with strap   Prone on Elbows Stretch 2 reps;20 seconds    Prone on Elbows Stretch Limitations no pain today; advised pt to hold if sx increase    Press Ups 5 reps    Press Ups Limitations also did 5 on wall with elbows at 90 deg     Piriformis Stretch Left;2 reps    Piriformis Stretch Limitations increased sx into L LE    Figure 4 Stretch 2 reps;30 seconds    Figure 4 Stretch Limitations seated; no sx    Other Lumbar Stretch Exercise supine nerve tensioner x 5 increased foot sx      Lumbar Exercises: Seated   Other Seated Lumbar Exercises sciatic nerve glides (not tensioner) in sitting x 10      Modalities   Modalities Electrical Stimulation;Moist Heat      Moist Heat Therapy   Number Minutes Moist Heat 10 Minutes    Moist Heat Location Hip      Electrical Stimulation   Electrical Stimulation Location left hip    Electrical Stimulation Action IFC    Electrical Stimulation Parameters TENS bil channels/modulation    Electrical Stimulation Goals Pain      Manual Therapy   Manual Therapy Soft tissue mobilization    Soft tissue mobilization to left gluteals prior and during DN            Trigger Point Dry Needling - 06/09/20 0001    Consent Given? Yes  [place], [place], [address] Phone: [phone]   Fax:  [phone]  Physical Therapy Treatment  Patient Details  Name: Stacy Preston MRN: [id number] Date of Birth: 09/20/1960 Referring Provider (PT): Dr Luetta Nutting   Encounter Date: 06/09/2020   PT End of Session - 06/09/20 1512    Visit Number 2    Number of Visits 12    Date for PT Re-Evaluation 07/16/20    PT Start Time 1512    PT Stop Time 1607    PT Time Calculation (min) 55 min    Activity Tolerance Patient tolerated treatment well    Behavior During Therapy [facility] for tasks assessed/performed           Past Medical History:  Diagnosis Date  . History of basal cell carcinoma 01/09/2016  . Hypertension   . Infertility, female   . Prolactinoma ([place])   . Vitamin D deficiency 01/09/2016    Past Surgical History:  Procedure Laterality Date  . ivf    . PLANTAR FASCIA RELEASE    . PLANTAR FASCIA SURGERY      There were no vitals filed for this visit.   Subjective Assessment - 06/09/20 1512    Subjective Patient reporting pain, N/T with nerve glide and increased pain with with extension, but after pain was better. She did a lot of walking yesterday and that increased pain as well.    Pertinent History cervical dysfunction; GI problems; HTN; plantar fasciitis bilat feet; laproscopies 3-5 times    Diagnostic tests MRI; CT: xrays    Patient Stated Goals get rid of pain in Lt leg    Currently in Pain? No/denies                             [facility] Adult PT Treatment/Exercise - 06/09/20 0001      Lumbar Exercises: Stretches   Passive Hamstring Stretch Left;2 reps;30 seconds   supine with strap   Prone on Elbows Stretch 2 reps;20 seconds    Prone on Elbows Stretch Limitations no pain today; advised pt to hold if sx increase    Press Ups 5 reps    Press Ups Limitations also did 5 on wall with elbows at 90 deg     Piriformis Stretch Left;2 reps    Piriformis Stretch Limitations increased sx into L LE    Figure 4 Stretch 2 reps;30 seconds    Figure 4 Stretch Limitations seated; no sx    Other Lumbar Stretch Exercise supine nerve tensioner x 5 increased foot sx      Lumbar Exercises: Seated   Other Seated Lumbar Exercises sciatic nerve glides (not tensioner) in sitting x 10      Modalities   Modalities Electrical Stimulation;Moist Heat      Moist Heat Therapy   Number Minutes Moist Heat 10 Minutes    Moist Heat Location Hip      Electrical Stimulation   Electrical Stimulation Location left hip    Electrical Stimulation Action IFC    Electrical Stimulation Parameters TENS bil channels/modulation    Electrical Stimulation Goals Pain      Manual Therapy   Manual Therapy Soft tissue mobilization    Soft tissue mobilization to left gluteals prior and during DN            Trigger Point Dry Needling - 06/09/20 0001    Consent Given? Yes  Pain  Visit Diagnosis: Radiculopathy, lumbar region  Other symptoms and signs involving the musculoskeletal system  Muscle weakness (generalized)     Problem List Patient Active Problem List   Diagnosis Date Noted  . Lumbar radiculopathy 05/20/2020  . Lower abdominal pain 05/20/2020  . Knee pain 05/20/2020  . Cervical stenosis (uterine cervix) 05/05/2020  . Pelvic and perineal pain 04/24/2020  . Cervical high risk HPV (human papillomavirus) test positive 01/08/2020  . Well adult exam 11/28/2019  . Elevated blood-pressure reading without diagnosis of hypertension 12/28/2018  . Tinnitus 11/27/2018  . GAD (generalized anxiety disorder) 12/21/2016  . Atrophic vaginitis 02/27/2016  . Vitamin D deficiency 01/09/2016  . History of prolactinoma 01/09/2016  . History of basal cell carcinoma 01/09/2016  . Cervical disc disorder with radiculopathy of cervical region 01/09/2016  . Elevated C-reactive protein (CRP) 01/09/2016    Madelyn Flavors PT 06/09/2020, 4:33 PM  [facility] [place], [place], [address] Phone: [phone]   Fax:  [phone]  Name: Stacy Preston MRN: [id number] Date of Birth: 1960/03/04

## 2020-06-09 NOTE — Patient Instructions (Signed)
Access Code: [email or web address] URL: [email or web address] Date: 06/09/2020 Prepared by: Almyra Free  Exercises Prone Press Up - 2 x daily - 7 x weekly - 1 sets - 10 reps - 2-3 sec hold Prone Press Up On Elbows - 2 x daily - 7 x weekly - 1 sets - 3 reps - 30-60 sec hold Hooklying Hamstring Stretch with Strap - 2 x daily - 7 x weekly - 1 sets - 3 reps - 30 sec hold Seated Piriformis Stretch - 3 x daily - 7 x weekly - 1 sets - 3 reps - 30-60 sec hold Seated Slump Nerve Glide - 2 x daily - 7 x weekly - 1-2 sets - 10 reps  Patient Education Trigger Point Dry Needling

## 2020-06-12 ENCOUNTER — Other Ambulatory Visit: Payer: Self-pay

## 2020-06-12 ENCOUNTER — Encounter: Payer: Self-pay | Admitting: Rehabilitative and Restorative Service Providers"

## 2020-06-12 ENCOUNTER — Ambulatory Visit (INDEPENDENT_AMBULATORY_CARE_PROVIDER_SITE_OTHER): Payer: BC Managed Care – PPO | Admitting: Rehabilitative and Restorative Service Providers"

## 2020-06-12 DIAGNOSIS — R29898 Other symptoms and signs involving the musculoskeletal system: Secondary | ICD-10-CM

## 2020-06-12 DIAGNOSIS — M6281 Muscle weakness (generalized): Secondary | ICD-10-CM | POA: Diagnosis not present

## 2020-06-12 DIAGNOSIS — M5416 Radiculopathy, lumbar region: Secondary | ICD-10-CM

## 2020-06-12 NOTE — Therapy (Addendum)
[place], [place], [address] Phone: [phone]   Fax:  [phone]  Physical Therapy Treatment  Patient Details  Name: Stacy Preston MRN: [id number] Date of Birth: [birth year] Referring Provider (PT): Dr Luetta Nutting   Encounter Date: 06/12/2020   PT End of Session - 06/12/20 1704    Visit Number 3    Number of Visits 12    Date for PT Re-Evaluation 07/16/20    PT Start Time 1703    PT Stop Time 1751    PT Time Calculation (min) 48 min    Activity Tolerance Patient tolerated treatment well           Past Medical History:  Diagnosis Date  . History of basal cell carcinoma 01/09/2016  . Hypertension   . Infertility, female   . Prolactinoma ([place])   . Vitamin D deficiency 01/09/2016    Past Surgical History:  Procedure Laterality Date  . ivf    . PLANTAR FASCIA RELEASE    . PLANTAR FASCIA SURGERY      There were no vitals filed for this visit.   Subjective Assessment - 06/12/20 1749    Subjective Continued improvement with no radicular pain. Now tolerating prone on elbows. Working on the exercises at home - also trying to get out of her chair some during her work day.    Currently in Pain? No/denies    Pain Score 0-No pain              [facility] PT Assessment - 06/12/20 0001      Assessment   Medical Diagnosis Lumbar dysfunction    Referring Provider (PT) Dr Luetta Nutting    Onset Date/Surgical Date 02/21/20    Hand Dominance Right    Next MD Visit to schedule    Prior Therapy here 2017 for cervical dysfunction      AROM   Lumbar Flexion 75% no LE symptoms    Lumbar Extension 60%    Lumbar - Right Side Bend 80%    Lumbar - Left Side Bend 80% pulling Lt    Lumbar - Right Rotation 50%    Lumbar - Left Rotation 55%                         [facility] Adult PT Treatment/Exercise - 06/12/20 0001      Lumbar Exercises: Stretches   Prone on Elbows Stretch 2  reps;30 seconds    Prone on Elbows Stretch Limitations no pain    Press Ups 5 reps    Quad Stretch Left;3 reps;30 seconds   prone with strap to address continued tightness in knee     Lumbar Exercises: Seated   Sit to Stand 10 reps    Sit to Stand Limitations working on hip hinge with core engaged    Other Seated Lumbar Exercises sciatic nerve glides (not tensioner) in sitting x 10      Lumbar Exercises: Quadruped   Madcat/Old Horse 5 reps      Moist Heat Therapy   Number Minutes Moist Heat 10 Minutes    Moist Heat Location Hip;Lumbar Spine      Manual Therapy   Manual therapy comments skilled palpation to assess response to DN and manual work    Joint Mobilization lumbar PA and lateral mobs Grade II    Soft tissue mobilization bilat lumbar paraspinals and Lt > Rt gluts/piriformis musculature    Myofascial Release posterior  Muscle weakness (generalized)     Problem List Patient  Active Problem List   Diagnosis Date Noted  . Lumbar radiculopathy 05/20/2020  . Lower abdominal pain 05/20/2020  . Knee pain 05/20/2020  . Cervical stenosis (uterine cervix) 05/05/2020  . Pelvic and perineal pain 04/24/2020  . Cervical high risk HPV (human papillomavirus) test positive 01/08/2020  . Well adult exam 11/28/2019  . Elevated blood-pressure reading without diagnosis of hypertension 12/28/2018  . Tinnitus 11/27/2018  . GAD (generalized anxiety disorder) 12/21/2016  . Atrophic vaginitis 02/27/2016  . Vitamin D deficiency 01/09/2016  . History of prolactinoma 01/09/2016  . History of basal cell carcinoma 01/09/2016  . Cervical disc disorder with radiculopathy of cervical region 01/09/2016  . Elevated C-reactive protein (CRP) 01/09/2016    Gianmarco Roye Nilda Simmer PT, MPH  06/12/2020, 6:05 PM  [facility] [place], [place], [address] Phone: [phone]   Fax:  [phone]  Name: Denzil Bristol MRN: [id number] Date of Birth: [birth year]  [place], [place], [address] Phone: [phone]   Fax:  [phone]  Physical Therapy Treatment  Patient Details  Name: Stacy Preston MRN: [id number] Date of Birth: [birth year] Referring Provider (PT): Dr Luetta Nutting   Encounter Date: 06/12/2020   PT End of Session - 06/12/20 1704    Visit Number 3    Number of Visits 12    Date for PT Re-Evaluation 07/16/20    PT Start Time 1703    PT Stop Time 1751    PT Time Calculation (min) 48 min    Activity Tolerance Patient tolerated treatment well           Past Medical History:  Diagnosis Date  . History of basal cell carcinoma 01/09/2016  . Hypertension   . Infertility, female   . Prolactinoma ([place])   . Vitamin D deficiency 01/09/2016    Past Surgical History:  Procedure Laterality Date  . ivf    . PLANTAR FASCIA RELEASE    . PLANTAR FASCIA SURGERY      There were no vitals filed for this visit.   Subjective Assessment - 06/12/20 1749    Subjective Continued improvement with no radicular pain. Now tolerating prone on elbows. Working on the exercises at home - also trying to get out of her chair some during her work day.    Currently in Pain? No/denies    Pain Score 0-No pain              [facility] PT Assessment - 06/12/20 0001      Assessment   Medical Diagnosis Lumbar dysfunction    Referring Provider (PT) Dr Luetta Nutting    Onset Date/Surgical Date 02/21/20    Hand Dominance Right    Next MD Visit to schedule    Prior Therapy here 2017 for cervical dysfunction      AROM   Lumbar Flexion 75% no LE symptoms    Lumbar Extension 60%    Lumbar - Right Side Bend 80%    Lumbar - Left Side Bend 80% pulling Lt    Lumbar - Right Rotation 50%    Lumbar - Left Rotation 55%                         [facility] Adult PT Treatment/Exercise - 06/12/20 0001      Lumbar Exercises: Stretches   Prone on Elbows Stretch 2  reps;30 seconds    Prone on Elbows Stretch Limitations no pain    Press Ups 5 reps    Quad Stretch Left;3 reps;30 seconds   prone with strap to address continued tightness in knee     Lumbar Exercises: Seated   Sit to Stand 10 reps    Sit to Stand Limitations working on hip hinge with core engaged    Other Seated Lumbar Exercises sciatic nerve glides (not tensioner) in sitting x 10      Lumbar Exercises: Quadruped   Madcat/Old Horse 5 reps      Moist Heat Therapy   Number Minutes Moist Heat 10 Minutes    Moist Heat Location Hip;Lumbar Spine      Manual Therapy   Manual therapy comments skilled palpation to assess response to DN and manual work    Joint Mobilization lumbar PA and lateral mobs Grade II    Soft tissue mobilization bilat lumbar paraspinals and Lt > Rt gluts/piriformis musculature    Myofascial Release posterior

## 2020-06-12 NOTE — Patient Instructions (Addendum)
  Access Code: [email or web address]: [email or web address]: 04/21/2022Prepared by: Carinna Newhart HoltExercises  Prone Press Up - 2 x daily - 7 x weekly - 1 sets - 10 reps - 2-3 sec hold  Prone Press Up On Elbows - 2 x daily - 7 x weekly - 1 sets - 3 reps - 30-60 sec hold  Hooklying Hamstring Stretch with Strap - 2 x daily - 7 x weekly - 1 sets - 3 reps - 30 sec hold  Seated Piriformis Stretch - 3 x daily - 7 x weekly - 1 sets - 3 reps - 30-60 sec hold  Seated Slump Nerve Glide - 2 x daily - 7 x weekly - 1-2 sets - 10 reps  Cat-Camel - 2 x daily - 7 x weekly - 1 sets - 5-10 reps - 3-5 sec hold  Seated Anterior Pelvic Tilt - 2 x daily - 7 x weekly - 1 sets - 5-10 reps - 2-3 sec hold  Prone Quadriceps Stretch with Strap - 2 x daily - 7 x weekly - 1 sets - 3 reps - 30 sec hold  Sit to Stand - 2 x daily - 7 x weekly - 1 sets - 10 reps - 3-5 sec hold

## 2020-06-13 ENCOUNTER — Encounter: Payer: Self-pay | Admitting: Family Medicine

## 2020-06-13 ENCOUNTER — Other Ambulatory Visit: Payer: Self-pay | Admitting: Family Medicine

## 2020-06-16 ENCOUNTER — Ambulatory Visit: Payer: BC Managed Care – PPO | Admitting: Rehabilitative and Restorative Service Providers"

## 2020-06-16 ENCOUNTER — Encounter: Payer: Self-pay | Admitting: Rehabilitative and Restorative Service Providers"

## 2020-06-16 ENCOUNTER — Other Ambulatory Visit: Payer: Self-pay

## 2020-06-16 DIAGNOSIS — M6281 Muscle weakness (generalized): Secondary | ICD-10-CM

## 2020-06-16 DIAGNOSIS — R29898 Other symptoms and signs involving the musculoskeletal system: Secondary | ICD-10-CM | POA: Diagnosis not present

## 2020-06-16 DIAGNOSIS — M5416 Radiculopathy, lumbar region: Secondary | ICD-10-CM | POA: Diagnosis not present

## 2020-06-16 NOTE — Therapy (Signed)
[place], [place], [address] Phone: [phone]   Fax:  [phone]  Physical Therapy Treatment  Patient Details  Name: Stacy Preston MRN: [id number] Date of Birth: [birth year] Referring Provider (PT): Dr Luetta Nutting   Encounter Date: 06/16/2020   PT End of Session - 06/16/20 1521    Visit Number 4    Number of Visits 12    Date for PT Re-Evaluation 07/16/20    PT Start Time 2778    PT Stop Time 1605    PT Time Calculation (min) 48 min    Activity Tolerance Patient tolerated treatment well           Past Medical History:  Diagnosis Date  . History of basal cell carcinoma 01/09/2016  . Hypertension   . Infertility, female   . Prolactinoma ([place])   . Vitamin D deficiency 01/09/2016    Past Surgical History:  Procedure Laterality Date  . ivf    . PLANTAR FASCIA RELEASE    . PLANTAR FASCIA SURGERY      There were no vitals filed for this visit.   Subjective Assessment - 06/16/20 1522    Subjective Patient reports increased soreness in Lt posterior buttock after DN last visit. Soreness resolved and she felt good untll yesterday when she bent forward to scrub tolet. Felt pain across the LB both sides    Currently in Pain? Yes    Pain Score 5     Pain Location Back    Pain Orientation Lower    Pain Descriptors / Indicators Sharp    Pain Type Acute pain    Pain Onset More than a month ago    Pain Frequency Intermittent    Aggravating Factors  bending forward; standing; walking; sitting; ADL's              [facility] PT Assessment - 06/16/20 0001      Assessment   Medical Diagnosis Lumbar dysfunction    Referring Provider (PT) Dr Luetta Nutting    Onset Date/Surgical Date 02/21/20    Hand Dominance Right    Next MD Visit to schedule    Prior Therapy here 2017 for cervical dysfunction      AROM   Lumbar Flexion 75% LBP no LE symptoms    Lumbar Extension 65%      Palpation    Spinal mobility hypomobile and painful with PA mobs L3/4/5/S1    Palpation comment muscular tightness bilat psoas Rt > Lt;  Lt posterior hip glut min/med; piriformis; posterior thigh                         [facility] Adult PT Treatment/Exercise - 06/16/20 0001      Therapeutic Activites    Therapeutic Activities Other Therapeutic Activities    Other Therapeutic Activities myofacial ball release bilat psoas      Lumbar Exercises: Stretches   Hip Flexor Stretch Right;Left;2 reps;30 seconds    Hip Flexor Stretch Limitations standing at counter and supine thomas    Prone on Elbows Stretch 2 reps;30 seconds    Prone on Elbows Stretch Limitations no pain    Press Ups 5 reps    Quad Stretch Left;3 reps;30 seconds   prone with strap to address continued tightness in knee     Lumbar Exercises: Aerobic   Nustep L5 x 7 min U/LE's      Moist Heat Therapy   Number Minutes Moist  of motion;Dry needling;Taping    PT Next Visit  Plan core stabilization and strengthening; assess HEP changes and response to taping progress as indicated    PT Home Exercise Plan [email or web address]    Consulted and Agree with Plan of Care Patient           Patient will benefit from skilled therapeutic intervention in order to improve the following deficits and impairments:     Visit Diagnosis: Radiculopathy, lumbar region  Other symptoms and signs involving the musculoskeletal system  Muscle weakness (generalized)     Problem List Patient Active Problem List   Diagnosis Date Noted  . Lumbar radiculopathy 05/20/2020  . Lower abdominal pain 05/20/2020  . Knee pain 05/20/2020  . Cervical stenosis (uterine cervix) 05/05/2020  . Pelvic and perineal pain 04/24/2020  . Cervical high risk HPV (human papillomavirus) test positive 01/08/2020  . Well adult exam 11/28/2019  . Elevated blood-pressure reading without diagnosis of hypertension 12/28/2018  . Tinnitus 11/27/2018  . GAD (generalized anxiety disorder) 12/21/2016  . Atrophic vaginitis 02/27/2016  . Vitamin D deficiency 01/09/2016  . History of prolactinoma 01/09/2016  . History of basal cell carcinoma 01/09/2016  . Cervical disc disorder with radiculopathy of cervical region 01/09/2016  . Elevated C-reactive protein (CRP) 01/09/2016    Leeah Politano Nilda Simmer PT, MPH  06/16/2020, 4:32 PM  [facility] [place], [place], [address] Phone: [phone]   Fax:  [phone]  Name: Stacy Preston MRN: [id number] Date of Birth: 04-12-1960  of motion;Dry needling;Taping    PT Next Visit  Plan core stabilization and strengthening; assess HEP changes and response to taping progress as indicated    PT Home Exercise Plan [email or web address]    Consulted and Agree with Plan of Care Patient           Patient will benefit from skilled therapeutic intervention in order to improve the following deficits and impairments:     Visit Diagnosis: Radiculopathy, lumbar region  Other symptoms and signs involving the musculoskeletal system  Muscle weakness (generalized)     Problem List Patient Active Problem List   Diagnosis Date Noted  . Lumbar radiculopathy 05/20/2020  . Lower abdominal pain 05/20/2020  . Knee pain 05/20/2020  . Cervical stenosis (uterine cervix) 05/05/2020  . Pelvic and perineal pain 04/24/2020  . Cervical high risk HPV (human papillomavirus) test positive 01/08/2020  . Well adult exam 11/28/2019  . Elevated blood-pressure reading without diagnosis of hypertension 12/28/2018  . Tinnitus 11/27/2018  . GAD (generalized anxiety disorder) 12/21/2016  . Atrophic vaginitis 02/27/2016  . Vitamin D deficiency 01/09/2016  . History of prolactinoma 01/09/2016  . History of basal cell carcinoma 01/09/2016  . Cervical disc disorder with radiculopathy of cervical region 01/09/2016  . Elevated C-reactive protein (CRP) 01/09/2016    Leeah Politano Nilda Simmer PT, MPH  06/16/2020, 4:32 PM  [facility] [place], [place], [address] Phone: [phone]   Fax:  [phone]  Name: Stacy Preston MRN: [id number] Date of Birth: 04-12-1960

## 2020-06-16 NOTE — Patient Instructions (Addendum)
Kinesiology tape What is kinesiology tape?  There are many brands of kinesiology tape.  KTape, [employer], [employer], Dynamic tape, to name a few. It is an elasticized tape designed to support the body's natural healing process. This tape provides stability and support to muscles and joints without restricting motion. It can also help decrease swelling in the area of application. How does it work? The tape microscopically lifts and decompresses the skin to allow for drainage of lymph (swelling) to flow away from area, reducing inflammation.  The tape has the ability to help re-educate the neuromuscular system by targeting specific receptors in the skin.  The presence of the tape increases the body's awareness of posture and body mechanics.  Do not use with: . Open wounds . Skin lesions . Adhesive allergies Safe removal of the tape: In some rare cases, mild/moderate skin irritation can occur.  This can include redness, itchiness, or hives. If this occurs, immediately remove tape and consult your primary care physician if symptoms are severe or do not resolve within 2 days.  To remove tape safely, hold nearby skin with one hand and gentle roll tape down with other hand.  You can apply oil or conditioner to tape while in shower prior to removal to loosen adhesive.  DO NOT swiftly rip tape off like a band-aid, as this could cause skin tears and additional skin irritation.   Access Code: [email or web address]: [email or web address]: 04/25/2022Prepared by: Arrie Zuercher HoltExercises  Prone Press Up - 2 x daily - 7 x weekly - 1 sets - 10 reps - 2-3 sec hold  Prone Press Up On Elbows - 2 x daily - 7 x weekly - 1 sets - 3 reps - 30-60 sec hold  Hooklying Hamstring Stretch with Strap - 2 x daily - 7 x weekly - 1 sets - 3 reps - 30 sec hold  Seated Piriformis Stretch - 3 x daily - 7 x weekly - 1 sets - 3 reps - 30-60 sec hold  Seated Slump Nerve Glide - 2 x daily - 7 x weekly - 1-2 sets - 10 reps   Cat-Camel - 2 x daily - 7 x weekly - 1 sets - 5-10 reps - 3-5 sec hold  Seated Anterior Pelvic Tilt - 2 x daily - 7 x weekly - 1 sets - 5-10 reps - 2-3 sec hold  Prone Quadriceps Stretch with Strap - 2 x daily - 7 x weekly - 1 sets - 3 reps - 30 sec hold  Sit to Stand - 2 x daily - 7 x weekly - 1 sets - 10 reps - 3-5 sec hold  Modified Thomas Stretch - 2 x daily - 7 x weekly - 1 sets - 3 reps - 30 sec hold

## 2020-06-19 ENCOUNTER — Encounter: Payer: Self-pay | Admitting: Rehabilitative and Restorative Service Providers"

## 2020-06-19 ENCOUNTER — Ambulatory Visit (INDEPENDENT_AMBULATORY_CARE_PROVIDER_SITE_OTHER): Payer: BC Managed Care – PPO | Admitting: Rehabilitative and Restorative Service Providers"

## 2020-06-19 ENCOUNTER — Other Ambulatory Visit: Payer: Self-pay

## 2020-06-19 DIAGNOSIS — M5416 Radiculopathy, lumbar region: Secondary | ICD-10-CM

## 2020-06-19 DIAGNOSIS — R29898 Other symptoms and signs involving the musculoskeletal system: Secondary | ICD-10-CM | POA: Diagnosis not present

## 2020-06-19 DIAGNOSIS — M6281 Muscle weakness (generalized): Secondary | ICD-10-CM | POA: Diagnosis not present

## 2020-06-19 NOTE — Patient Instructions (Signed)
Access Code: [email or web address]: [email or web address]: 04/28/2022Prepared by: Michaeljames Milnes HoltExercises  Prone Press Up - 2 x daily - 7 x weekly - 1 sets - 10 reps - 2-3 sec hold  Prone Press Up On Elbows - 2 x daily - 7 x weekly - 1 sets - 3 reps - 30-60 sec hold  Hooklying Hamstring Stretch with Strap - 2 x daily - 7 x weekly - 1 sets - 3 reps - 30 sec hold  Seated Piriformis Stretch - 3 x daily - 7 x weekly - 1 sets - 3 reps - 30-60 sec hold  Seated Slump Nerve Glide - 2 x daily - 7 x weekly - 1-2 sets - 10 reps  Cat-Camel - 2 x daily - 7 x weekly - 1 sets - 5-10 reps - 3-5 sec hold  Seated Anterior Pelvic Tilt - 2 x daily - 7 x weekly - 1 sets - 5-10 reps - 2-3 sec hold  Prone Quadriceps Stretch with Strap - 2 x daily - 7 x weekly - 1 sets - 3 reps - 30 sec hold  Sit to Stand - 2 x daily - 7 x weekly - 1 sets - 10 reps - 3-5 sec hold  Modified Thomas Stretch - 2 x daily - 7 x weekly - 1 sets - 3 reps - 30 sec hold  Hooklying Single Knee to Chest Stretch - 2 x daily - 7 x weekly - 1 sets - 3-5 reps - 10-15 sec hold  Supine Double Knee to Chest - 2 x daily - 7 x weekly - 1 sets - 3-5 reps - 10-15 sec hold

## 2020-06-19 NOTE — Therapy (Signed)
[facility] [place], [place], [address] Phone: [phone]   Fax:  [phone]  Physical Therapy Treatment  Patient Details  Name: Stacy Preston MRN: [id number] Date of Birth: 1960-09-20 Referring Provider (PT): Dr Luetta Nutting   Encounter Date: 06/19/2020   PT End of Session - 06/19/20 1704    Visit Number 5    Number of Visits 12    Date for PT Re-Evaluation 07/16/20    PT Start Time 1700    PT Stop Time 4268    PT Time Calculation (min) 48 min    Activity Tolerance Patient tolerated treatment well           Past Medical History:  Diagnosis Date  . History of basal cell carcinoma 01/09/2016  . Hypertension   . Infertility, female   . Prolactinoma ([place])   . Vitamin D deficiency 01/09/2016    Past Surgical History:  Procedure Laterality Date  . ivf    . PLANTAR FASCIA RELEASE    . PLANTAR FASCIA SURGERY      There were no vitals filed for this visit.   Subjective Assessment - 06/19/20 1709    Subjective Patient reports that she continues to have LBP from Sunday. Can't tell the tape helped. Working on the stretches for the hip flexors in standing. Can now tell the pain she has been having in the abdomen is related to the tightness in the front of her hips.    Currently in Pain? Yes    Pain Score 5     Pain Location Back    Pain Orientation Lower    Pain Descriptors / Indicators Sharp    Pain Type Acute pain                             [facility] Adult PT Treatment/Exercise - 06/19/20 0001      Therapeutic Activites    Other Therapeutic Activities myofacial ball release bilat psoas lying prone tightner Rt than Lt      Lumbar Exercises: Stretches   Single Knee to Chest Stretch Right;Left;3 reps;10 seconds;20 seconds    Double Knee to Chest Stretch 3 reps;10 seconds;20 seconds    Hip Flexor Stretch Right;Left;2 reps;30 seconds    Hip Flexor Stretch Limitations standing  at counter and supine thomas      Lumbar Exercises: Aerobic   Nustep L5 x 5 min U/LE's      Lumbar Exercises: Seated   Sit to Stand 10 reps    Sit to Stand Limitations working on hip hinge with core engaged      Moist Heat Therapy   Number Minutes Moist Heat 10 Minutes    Moist Heat Location Hip;Lumbar Spine      Manual Therapy   Soft tissue mobilization bilat iliopsoas muscuature                  PT Education - 06/19/20 1740    Education Details HEP    Person(s) Educated Patient    Methods Explanation;Demonstration;Tactile cues;Verbal cues;Handout    Comprehension Verbalized understanding;Returned demonstration;Verbal cues required;Tactile cues required               PT Long Term Goals - 06/04/20 1712      PT LONG TERM GOAL #1   Title Decrease pain LB and Lt LE by 75-100% allowing patient to return to all normal functional activities    Time 6  [facility] [place], [place], [address] Phone: [phone]   Fax:  [phone]  Physical Therapy Treatment  Patient Details  Name: Stacy Preston MRN: [id number] Date of Birth: 1960-09-20 Referring Provider (PT): Dr Luetta Nutting   Encounter Date: 06/19/2020   PT End of Session - 06/19/20 1704    Visit Number 5    Number of Visits 12    Date for PT Re-Evaluation 07/16/20    PT Start Time 1700    PT Stop Time 4268    PT Time Calculation (min) 48 min    Activity Tolerance Patient tolerated treatment well           Past Medical History:  Diagnosis Date  . History of basal cell carcinoma 01/09/2016  . Hypertension   . Infertility, female   . Prolactinoma ([place])   . Vitamin D deficiency 01/09/2016    Past Surgical History:  Procedure Laterality Date  . ivf    . PLANTAR FASCIA RELEASE    . PLANTAR FASCIA SURGERY      There were no vitals filed for this visit.   Subjective Assessment - 06/19/20 1709    Subjective Patient reports that she continues to have LBP from Sunday. Can't tell the tape helped. Working on the stretches for the hip flexors in standing. Can now tell the pain she has been having in the abdomen is related to the tightness in the front of her hips.    Currently in Pain? Yes    Pain Score 5     Pain Location Back    Pain Orientation Lower    Pain Descriptors / Indicators Sharp    Pain Type Acute pain                             [facility] Adult PT Treatment/Exercise - 06/19/20 0001      Therapeutic Activites    Other Therapeutic Activities myofacial ball release bilat psoas lying prone tightner Rt than Lt      Lumbar Exercises: Stretches   Single Knee to Chest Stretch Right;Left;3 reps;10 seconds;20 seconds    Double Knee to Chest Stretch 3 reps;10 seconds;20 seconds    Hip Flexor Stretch Right;Left;2 reps;30 seconds    Hip Flexor Stretch Limitations standing  at counter and supine thomas      Lumbar Exercises: Aerobic   Nustep L5 x 5 min U/LE's      Lumbar Exercises: Seated   Sit to Stand 10 reps    Sit to Stand Limitations working on hip hinge with core engaged      Moist Heat Therapy   Number Minutes Moist Heat 10 Minutes    Moist Heat Location Hip;Lumbar Spine      Manual Therapy   Soft tissue mobilization bilat iliopsoas muscuature                  PT Education - 06/19/20 1740    Education Details HEP    Person(s) Educated Patient    Methods Explanation;Demonstration;Tactile cues;Verbal cues;Handout    Comprehension Verbalized understanding;Returned demonstration;Verbal cues required;Tactile cues required               PT Long Term Goals - 06/04/20 1712      PT LONG TERM GOAL #1   Title Decrease pain LB and Lt LE by 75-100% allowing patient to return to all normal functional activities    Time 6  [place], [place], [address] Phone: [phone]   Fax:  [phone]  Name: Stacy Preston MRN: [id number] Date of Birth: 11/14/1960

## 2020-06-23 ENCOUNTER — Ambulatory Visit (INDEPENDENT_AMBULATORY_CARE_PROVIDER_SITE_OTHER): Payer: BC Managed Care – PPO | Admitting: Rehabilitative and Restorative Service Providers"

## 2020-06-23 ENCOUNTER — Encounter: Payer: Self-pay | Admitting: Rehabilitative and Restorative Service Providers"

## 2020-06-23 ENCOUNTER — Other Ambulatory Visit: Payer: Self-pay

## 2020-06-23 DIAGNOSIS — M6281 Muscle weakness (generalized): Secondary | ICD-10-CM | POA: Diagnosis not present

## 2020-06-23 DIAGNOSIS — M5416 Radiculopathy, lumbar region: Secondary | ICD-10-CM

## 2020-06-23 DIAGNOSIS — R29898 Other symptoms and signs involving the musculoskeletal system: Secondary | ICD-10-CM | POA: Diagnosis not present

## 2020-06-23 NOTE — Patient Instructions (Signed)
Access Code: [email or web address]: [email or web address]: 05/02/2022Prepared by: Saniya Tranchina HoltExercises  Prone Press Up - 2 x daily - 7 x weekly - 1 sets - 10 reps - 2-3 sec hold  Prone Press Up On Elbows - 2 x daily - 7 x weekly - 1 sets - 3 reps - 30-60 sec hold  Hooklying Hamstring Stretch with Strap - 2 x daily - 7 x weekly - 1 sets - 3 reps - 30 sec hold  Seated Slump Nerve Glide - 2 x daily - 7 x weekly - 1-2 sets - 10 reps  Sit to Stand - 2 x daily - 7 x weekly - 1 sets - 10 reps - 3-5 sec hold  Modified Thomas Stretch - 2 x daily - 7 x weekly - 1 sets - 3 reps - 30 sec hold  Supine Double Knee to Chest - 2 x daily - 7 x weekly - 1 sets - 3-5 reps - 10-15 sec hold  Wall Quarter Squat - 2 x daily - 7 x weekly - 1-2 sets - 10 reps - 5-10 sec hold  Single Leg Balance with Clock Reach - 2 x daily - 7 x weekly - 1 sets - 10 reps - 2 sec hold  Supine Piriformis Stretch with Leg Straight - 2 x daily - 7 x weekly - 1 sets - 3 reps - 30 sec hold

## 2020-06-23 NOTE — Therapy (Signed)
PT Treatment/Interventions ADLs/Self Care Home Management;Aquatic Therapy;Cryotherapy;Electrical Stimulation;Iontophoresis 4mg /ml Dexamethasone;Moist Heat;Traction;Ultrasound;Functional mobility training;Therapeutic  activities;Therapeutic exercise;Neuromuscular re-education;Patient/family education;Manual techniques;Passive range of motion;Dry needling;Taping    PT Next Visit Plan core stabilization and strengthening; assess response to manual work through the iliopsoas musculature    PT Oak and Agree with Plan of Care Patient           Patient will benefit from skilled therapeutic intervention in order to improve the following deficits and impairments:     Visit Diagnosis: Radiculopathy, lumbar region  Other symptoms and signs involving the musculoskeletal system  Muscle weakness (generalized)     Problem List Patient Active Problem List   Diagnosis Date Noted  . Lumbar radiculopathy 05/20/2020  . Lower abdominal pain 05/20/2020  . Knee pain 05/20/2020  . Cervical stenosis (uterine cervix) 05/05/2020  . Pelvic and perineal pain 04/24/2020  . Cervical high risk HPV (human papillomavirus) test positive 01/08/2020  . Well adult exam 11/28/2019  . Elevated blood-pressure reading without diagnosis of hypertension 12/28/2018  . Tinnitus 11/27/2018  . GAD (generalized anxiety disorder) 12/21/2016  . Atrophic vaginitis 02/27/2016  . Vitamin D deficiency 01/09/2016  . History of prolactinoma 01/09/2016  . History of basal cell carcinoma 01/09/2016  . Cervical disc disorder with radiculopathy of cervical region 01/09/2016  . Elevated C-reactive protein (CRP) 01/09/2016    Jaycey Gens Nilda Simmer PT, MPH  06/23/2020, 5:01 PM  [facility] [place], [place], [address] Phone: [phone]   Fax:  [phone]  Name: Stacy Preston MRN: [id number] Date of Birth: [birth year]  [place], [place], [address] Phone: [phone]   Fax:  [phone]  Physical Therapy Treatment  Patient Details  Name: Stacy Preston MRN: [id number] Date of Birth: Jun 05, 1960 Referring Provider (PT): Dr Luetta Nutting   Encounter Date: 06/23/2020   PT End of Session - 06/23/20 1601    Visit Number 6    Number of Visits 12    Date for PT Re-Evaluation 07/16/20    PT Start Time 1600    PT Stop Time 1648    PT Time Calculation (min) 48 min    Activity Tolerance Patient tolerated treatment well           Past Medical History:  Diagnosis Date  . History of basal cell carcinoma 01/09/2016  . Hypertension   . Infertility, female   . Prolactinoma ([place] [address])   . Vitamin D deficiency 01/09/2016    Past Surgical History:  Procedure Laterality Date  . ivf    . PLANTAR FASCIA RELEASE    . PLANTAR FASCIA SURGERY      There were no vitals filed for this visit.   Subjective Assessment - 06/23/20 1601    Subjective No radicular pain and no LBP. She continues to have pain in the abdomen and pain with bowel movements. She continues to have pain in the lower abdomen. Still working on her exercises at home and ball release work. Not scheduled for colonoscopy until 07/08/20. Thinks she figured out what happened to start all this - rode 4 hours with LE's in awkward position with items at feet and experienced severe pain the next morning which has persisted. Noticed some similar symptoms with a jeep ride yesterday.    Currently in Pain? Yes    Pain Score 4     Pain Location Abdomen    Pain Orientation Lower    Pain Descriptors / Indicators Tightness   pinching   Pain Type Acute pain              [facility] PT Assessment - 06/23/20 0001      Assessment   Medical Diagnosis Lumbar dysfunction    Referring Provider (PT) Dr Luetta Nutting    Onset Date/Surgical Date 02/21/20    Hand Dominance Right    Next MD  Visit to schedule    Prior Therapy here 2017 for cervical dysfunction      Posture/Postural Control   Posture Comments improving posture and alignment with more upright posture and hip extension      Palpation   Palpation comment muscular tightness bilat psoas Rt > Lt;  Lt posterior hip glut min/med; piriformis; posterior thigh                         [facility] Adult PT Treatment/Exercise - 06/23/20 0001      Self-Care   Other Self-Care Comments  education re sitting posture/positions use of foam roll with sitting      Therapeutic Activites    Other Therapeutic Activities myofacial ball release bilat psoas lying prone tightner Rt than Lt      Lumbar Exercises: Stretches   Double Knee to Chest Stretch 3 reps;10 seconds;20 seconds    Hip Flexor Stretch Right;Left;2 reps;30 seconds    Hip Flexor Stretch Limitations standing at counter and supine thomas    Piriformis Stretch Left;2 reps   supine travell - foot across Rt LE and foot unsupported 2 reps each   Piriformis Stretch  PT Treatment/Interventions ADLs/Self Care Home Management;Aquatic Therapy;Cryotherapy;Electrical Stimulation;Iontophoresis 4mg /ml Dexamethasone;Moist Heat;Traction;Ultrasound;Functional mobility training;Therapeutic  activities;Therapeutic exercise;Neuromuscular re-education;Patient/family education;Manual techniques;Passive range of motion;Dry needling;Taping    PT Next Visit Plan core stabilization and strengthening; assess response to manual work through the iliopsoas musculature    PT Oak and Agree with Plan of Care Patient           Patient will benefit from skilled therapeutic intervention in order to improve the following deficits and impairments:     Visit Diagnosis: Radiculopathy, lumbar region  Other symptoms and signs involving the musculoskeletal system  Muscle weakness (generalized)     Problem List Patient Active Problem List   Diagnosis Date Noted  . Lumbar radiculopathy 05/20/2020  . Lower abdominal pain 05/20/2020  . Knee pain 05/20/2020  . Cervical stenosis (uterine cervix) 05/05/2020  . Pelvic and perineal pain 04/24/2020  . Cervical high risk HPV (human papillomavirus) test positive 01/08/2020  . Well adult exam 11/28/2019  . Elevated blood-pressure reading without diagnosis of hypertension 12/28/2018  . Tinnitus 11/27/2018  . GAD (generalized anxiety disorder) 12/21/2016  . Atrophic vaginitis 02/27/2016  . Vitamin D deficiency 01/09/2016  . History of prolactinoma 01/09/2016  . History of basal cell carcinoma 01/09/2016  . Cervical disc disorder with radiculopathy of cervical region 01/09/2016  . Elevated C-reactive protein (CRP) 01/09/2016    Jaycey Gens Nilda Simmer PT, MPH  06/23/2020, 5:01 PM  [facility] [place], [place], [address] Phone: [phone]   Fax:  [phone]  Name: Stacy Preston MRN: [id number] Date of Birth: [birth year]

## 2020-06-26 ENCOUNTER — Encounter: Payer: Self-pay | Admitting: Physical Therapy

## 2020-06-26 ENCOUNTER — Ambulatory Visit (INDEPENDENT_AMBULATORY_CARE_PROVIDER_SITE_OTHER): Payer: BC Managed Care – PPO | Admitting: Physical Therapy

## 2020-06-26 ENCOUNTER — Other Ambulatory Visit: Payer: Self-pay

## 2020-06-26 DIAGNOSIS — M5416 Radiculopathy, lumbar region: Secondary | ICD-10-CM

## 2020-06-26 DIAGNOSIS — M6281 Muscle weakness (generalized): Secondary | ICD-10-CM

## 2020-06-26 DIAGNOSIS — R29898 Other symptoms and signs involving the musculoskeletal system: Secondary | ICD-10-CM | POA: Diagnosis not present

## 2020-06-26 NOTE — Therapy (Signed)
exam 11/28/2019  . Elevated blood-pressure reading without diagnosis of hypertension 12/28/2018  . Tinnitus 11/27/2018  . GAD (generalized anxiety disorder) 12/21/2016  . Atrophic vaginitis 02/27/2016  . Vitamin D deficiency 01/09/2016  . History of prolactinoma 01/09/2016  . History of basal cell carcinoma 01/09/2016  . Cervical disc disorder with radiculopathy of cervical region 01/09/2016  . Elevated C-reactive protein (CRP) 01/09/2016    Kerin Perna, PTA 06/26/20 4:48 PM  [facility], [place], [address] Phone: [phone]   Fax:  [phone]  Name: Stacy Preston MRN: [id number] Date of Birth: 07-03-1960  [place], [place], [address] Phone: [phone]   Fax:  [phone]  Physical Therapy Treatment  Patient Details  Name: Stacy Preston MRN: [id number] Date of Birth: Feb 17, 1961 Referring Provider (PT): Dr Luetta Nutting   Encounter Date: 06/26/2020   PT End of Session - 06/26/20 1532    Visit Number 7    Number of Visits 12    Date for PT Re-Evaluation 07/16/20    PT Start Time 1532    PT Stop Time 1622    PT Time Calculation (min) 50 min    Activity Tolerance Patient tolerated treatment well    Behavior During Therapy [facility] for tasks assessed/performed           Past Medical History:  Diagnosis Date  . History of basal cell carcinoma 01/09/2016  . Hypertension   . Infertility, female   . Prolactinoma ([place])   . Vitamin D deficiency 01/09/2016    Past Surgical History:  Procedure Laterality Date  . ivf    . PLANTAR FASCIA RELEASE    . PLANTAR FASCIA SURGERY      There were no vitals filed for this visit.   Subjective Assessment - 06/26/20 1534    Subjective Pt brought a TENS for instruction on use. She's been using her Total gym (on 6th notch) 3 times since this started. Pain in lower abdomen is still present.    Pertinent History cervical dysfunction; GI problems; HTN; plantar fasciitis bilat feet; laproscopies 3-5 times    Patient Stated Goals get rid of pain in Lt leg    Currently in Pain? Yes    Pain Score 4     Pain Location Abdomen    Pain Orientation Lower    Pain Descriptors / Indicators --   "pinching"   Aggravating Factors  bowel movement, sexual intercourse.    Pain Relieving Factors nothing              [facility] PT Assessment - 06/26/20 0001      Assessment   Medical Diagnosis Lumbar dysfunction    Referring Provider (PT) Dr Luetta Nutting    Onset Date/Surgical Date 02/21/20    Hand Dominance Right    Next MD Visit to schedule    Prior Therapy here 2017 for  cervical dysfunction            [facility] Adult PT Treatment/Exercise - 06/26/20 0001      Self-Care   Other Self-Care Comments  Pt instructed on safe application, set up, and operation of TENS unit.  Pt verbalized understanding.      Lumbar Exercises: Stretches   Hip Flexor Stretch Right;Left;2 reps   seated, leg back, arm overhead   Hip Flexor Stretch Limitations standing with leg ext and same side arm rreaching towards ceiling x 4 reps each side.    Prone on Elbows Stretch 2 reps;30 seconds    Press Ups 5 reps;5 seconds    Other Lumbar Stretch Exercise Open book thoracic rotation with arm in T and Y x 8 gentle reps each side.    Other Lumbar Stretch Exercise modified triangle pose with arm resting on counter and other arm reaching towards ceiling x 15-20 sec each side.      Manual Therapy   Manual therapy comments pt in supported supine with black bolster. Pt more tender on Rt side than left.    Myofascial Release MFR to bilat obliques, rectus abdominus, and iliopsoas with cues for  [place], [place], [address] Phone: [phone]   Fax:  [phone]  Physical Therapy Treatment  Patient Details  Name: Stacy Preston MRN: [id number] Date of Birth: Feb 17, 1961 Referring Provider (PT): Dr Luetta Nutting   Encounter Date: 06/26/2020   PT End of Session - 06/26/20 1532    Visit Number 7    Number of Visits 12    Date for PT Re-Evaluation 07/16/20    PT Start Time 1532    PT Stop Time 1622    PT Time Calculation (min) 50 min    Activity Tolerance Patient tolerated treatment well    Behavior During Therapy [facility] for tasks assessed/performed           Past Medical History:  Diagnosis Date  . History of basal cell carcinoma 01/09/2016  . Hypertension   . Infertility, female   . Prolactinoma ([place])   . Vitamin D deficiency 01/09/2016    Past Surgical History:  Procedure Laterality Date  . ivf    . PLANTAR FASCIA RELEASE    . PLANTAR FASCIA SURGERY      There were no vitals filed for this visit.   Subjective Assessment - 06/26/20 1534    Subjective Pt brought a TENS for instruction on use. She's been using her Total gym (on 6th notch) 3 times since this started. Pain in lower abdomen is still present.    Pertinent History cervical dysfunction; GI problems; HTN; plantar fasciitis bilat feet; laproscopies 3-5 times    Patient Stated Goals get rid of pain in Lt leg    Currently in Pain? Yes    Pain Score 4     Pain Location Abdomen    Pain Orientation Lower    Pain Descriptors / Indicators --   "pinching"   Aggravating Factors  bowel movement, sexual intercourse.    Pain Relieving Factors nothing              [facility] PT Assessment - 06/26/20 0001      Assessment   Medical Diagnosis Lumbar dysfunction    Referring Provider (PT) Dr Luetta Nutting    Onset Date/Surgical Date 02/21/20    Hand Dominance Right    Next MD Visit to schedule    Prior Therapy here 2017 for  cervical dysfunction            [facility] Adult PT Treatment/Exercise - 06/26/20 0001      Self-Care   Other Self-Care Comments  Pt instructed on safe application, set up, and operation of TENS unit.  Pt verbalized understanding.      Lumbar Exercises: Stretches   Hip Flexor Stretch Right;Left;2 reps   seated, leg back, arm overhead   Hip Flexor Stretch Limitations standing with leg ext and same side arm rreaching towards ceiling x 4 reps each side.    Prone on Elbows Stretch 2 reps;30 seconds    Press Ups 5 reps;5 seconds    Other Lumbar Stretch Exercise Open book thoracic rotation with arm in T and Y x 8 gentle reps each side.    Other Lumbar Stretch Exercise modified triangle pose with arm resting on counter and other arm reaching towards ceiling x 15-20 sec each side.      Manual Therapy   Manual therapy comments pt in supported supine with black bolster. Pt more tender on Rt side than left.    Myofascial Release MFR to bilat obliques, rectus abdominus, and iliopsoas with cues for

## 2020-06-27 ENCOUNTER — Encounter: Payer: BC Managed Care – PPO | Admitting: Rehabilitative and Restorative Service Providers"

## 2020-06-29 ENCOUNTER — Encounter: Payer: Self-pay | Admitting: Family Medicine

## 2020-06-30 ENCOUNTER — Encounter: Payer: BC Managed Care – PPO | Admitting: Rehabilitative and Restorative Service Providers"

## 2020-06-30 ENCOUNTER — Other Ambulatory Visit: Payer: Self-pay | Admitting: Family Medicine

## 2020-06-30 ENCOUNTER — Ambulatory Visit (AMBULATORY_SURGERY_CENTER): Payer: BC Managed Care – PPO | Admitting: *Deleted

## 2020-06-30 ENCOUNTER — Other Ambulatory Visit: Payer: Self-pay

## 2020-06-30 VITALS — Ht 61.0 in | Wt 192.0 lb

## 2020-06-30 DIAGNOSIS — Z1211 Encounter for screening for malignant neoplasm of colon: Secondary | ICD-10-CM

## 2020-06-30 MED ORDER — SUTAB 1479-225-188 MG PO TABS: 1.0000 | ORAL_TABLET | ORAL | 0 refills | Status: DC

## 2020-06-30 NOTE — Progress Notes (Signed)
Patient's pre-visit was done today over the phone with the patient due to COVID-19 pandemic. Name,DOB and address verified. Insurance verified. Patient denies any allergies to Eggs and Soy. Patient denies any problems with anesthesia/sedation. Patient denies taking  blood thinners. Patient is on Phentermine and she is aware to stop this 10 days before procedure. Packet of Prep instructions mailed to patient including a copy of a consent form-pt is aware. [employer] Prep coupon included. Patient understands to call us back with any questions or concerns. Patient is aware of our care-partner policy and [id number] safety protocol. EMMI education assigned to the patient for the procedure, sent to [place]. The patient is COVID-19 vaccinated, per patient.

## 2020-07-02 ENCOUNTER — Encounter: Payer: BC Managed Care – PPO | Admitting: Rehabilitative and Restorative Service Providers"

## 2020-07-03 ENCOUNTER — Encounter: Payer: BC Managed Care – PPO | Admitting: Rehabilitative and Restorative Service Providers"

## 2020-07-04 NOTE — Telephone Encounter (Signed)
Can we see if Stacy Preston can mover her colonoscopy up to a sooner date?

## 2020-07-07 ENCOUNTER — Ambulatory Visit: Payer: BC Managed Care – PPO | Admitting: Rehabilitative and Restorative Service Providers"

## 2020-07-07 ENCOUNTER — Other Ambulatory Visit: Payer: Self-pay

## 2020-07-07 ENCOUNTER — Encounter: Payer: Self-pay | Admitting: Rehabilitative and Restorative Service Providers"

## 2020-07-07 DIAGNOSIS — R29898 Other symptoms and signs involving the musculoskeletal system: Secondary | ICD-10-CM | POA: Diagnosis not present

## 2020-07-07 DIAGNOSIS — M6281 Muscle weakness (generalized): Secondary | ICD-10-CM

## 2020-07-07 DIAGNOSIS — M5416 Radiculopathy, lumbar region: Secondary | ICD-10-CM

## 2020-07-07 NOTE — Therapy (Signed)
[facility] [place] [address] [place] [address] [place], [place], [address] Phone: [phone]   Fax:  [phone]  Physical Therapy Treatment  Patient Details  Name: Stacy Preston MRN: [id number] Date of Birth: 23-Oct-1960 Referring Provider (PT): Dr Everrett Coombe   Encounter Date: 07/07/2020   PT End of Session - 07/07/20 1557    Visit Number 8    Number of Visits 12    Date for PT Re-Evaluation 07/16/20    PT Start Time 1557    PT Stop Time 1645   MH end of treatment   PT Time Calculation (min) 48 min    Activity Tolerance Patient tolerated treatment well           Past Medical History:  Diagnosis Date  . History of basal cell carcinoma 01/09/2016  . Hypertension   . Infertility, female   . Post-operative nausea and vomiting   . Prolactinoma (HCC)   . Vitamin D deficiency 01/09/2016    Past Surgical History:  Procedure Laterality Date  . COLONOSCOPY  2013, 2017   [place]  . iv[place]    . PLANTAR FASCIA RELEASE    . PLANTAR FASCIA SURGERY      There were no vitals filed for this visit.   Subjective Assessment - 07/07/20 1557    Subjective Using TENs unit a couple of times. She has had a lot going on. Working on her exercises at home. Rode her bike last week and did not have any increase in pain.    Currently in Pain? Yes    Pain Score 4     Pain Location Groin    Pain Orientation Lower    Pain Descriptors / Indicators Aching    Pain Type Acute pain    Pain Onset More than a month ago    Pain Frequency Intermittent    Aggravating Factors  bowel movement; sexual intercourse    Pain Relieving Factors sometimes stretches              [facility] PT Assessment - 07/07/20 0001      Assessment   Medical Diagnosis Lumbar dysfunction    Referring Provider (PT) Dr Everrett Coombe    Onset Date/Surgical Date 02/21/20    Hand Dominance Right    Next MD Visit to schedule    Prior Therapy here 2017 for cervical dysfunction       Palpation   Palpation comment muscular tightness bilat illiopsoas; hip adductors; rectus abdominis Lt > Rt                         [facility] Adult PT Treatment/Exercise - 07/07/20 0001      Lumbar Exercises: Stretches   Hip Flexor Stretch Right;Left;2 reps   seated, leg back, arm overhead   Hip Flexor Stretch Limitations standing with leg ext and same side arm reaching towards ceiling x 4 reps each side.    Prone on Elbows Stretch 2 reps;30 seconds    Press Ups 5 reps;5 seconds      Lumbar Exercises: Aerobic   Nustep L5 x 2 min - hold - increased hip flexor tightness      Moist Heat Therapy   Number Minutes Moist Heat 10 Minutes    Moist Heat Location Other (comment)   abdominals and hip flexors bilat     Manual Therapy   Manual therapy comments skilled palpation to assess response to DN/manual work    Soft tissue mobilization  bilat iliopsoas and hip adductor muscuature            Trigger Point Dry Needling - 07/07/20 0001    Consent Given? Yes    Education Handout Provided Previously provided    Dry Needling Comments bilat    Adductor Response Palpable increased muscle length;Twitch response elicited    Iliopsoas Response Palpable increased muscle length                     PT Long Term Goals - 06/26/20 1646      PT LONG TERM GOAL #1   Title Decrease pain LB and Lt LE by 75-100% allowing patient to return to all normal functional activities    Time 6    Period Weeks    Status Partially Met      PT LONG TERM GOAL #2   Title Improve radicular symptoms Lt LE with patient report minimal to no radicular symptoms    Time 6    Period Weeks    Status Achieved      PT LONG TERM GOAL #3   Title Improve functional activities with patient to report sitting, standing and walking to 30-60 min without onset of symptoms    Time 6    Period Weeks    Status On-going      PT LONG TERM GOAL #4   Title Independent in HEP    Time 6    Period Weeks     Status On-going      PT LONG TERM GOAL #5   Title Improve functional limitation score to 53    Time 6    Period Weeks    Status On-going      PT LONG TERM GOAL #6   Title Increase Lt LE strength to 5/5 throughout    Time 6    Period Weeks    Status On-going                 Plan - 07/07/20 1604    Clinical Impression Statement Lower abdominal pain continues Patient has eliminated crunches and total gym for now. Patient continues to have pain in the anterior hip and lower abdominal area and pain with bowel movements. (Her colonoscopy has been postponed until 6/22.) Patient has palpable tightness through the hip flexors and adductors; point tenderness at the lateral pubic bone and through the ingunial ligament area Lt > Rt. Good response to DN and manual work through this area today. Patient will bring history of gynecological problems for review. May benefit from referral to pelvic floor specialist.    Rehab Potential Good    PT Frequency 2x / week    PT Duration 6 weeks    PT Treatment/Interventions ADLs/Self Care Home Management;Aquatic Therapy;Cryotherapy;Electrical Stimulation;Iontophoresis 4mg /ml Dexamethasone;Moist Heat;Traction;Ultrasound;Functional mobility training;Therapeutic activities;Therapeutic exercise;Neuromuscular re-education;Patient/family education;Manual techniques;Passive range of motion;Dry needling;Taping    PT Next Visit Plan core stabilization and strengthening; assess response to manual work through anterior hips and adductors    PT Home Exercise Plan [email or web address]    Consulted and Agree with Plan of Care Patient           Patient will benefit from skilled therapeutic intervention in order to improve the following deficits and impairments:     Visit Diagnosis: Radiculopathy, lumbar region  Other symptoms and signs involving the musculoskeletal system  Muscle weakness (generalized)     Problem List Patient Active Problem List   Diagnosis Date  Noted  . Lumbar radiculopathy 05/20/2020  .  Lower abdominal pain 05/20/2020  . Knee pain 05/20/2020  . Cervical stenosis (uterine cervix) 05/05/2020  . Pelvic and perineal pain 04/24/2020  . Cervical high risk HPV (human papillomavirus) test positive 01/08/2020  . Well adult exam 11/28/2019  . Elevated blood-pressure reading without diagnosis of hypertension 12/28/2018  . Tinnitus 11/27/2018  . GAD (generalized anxiety disorder) 12/21/2016  . Atrophic vaginitis 02/27/2016  . Vitamin D deficiency 01/09/2016  . History of prolactinoma 01/09/2016  . History of basal cell carcinoma 01/09/2016  . Cervical disc disorder with radiculopathy of cervical region 01/09/2016  . Elevated C-reactive protein (CRP) 01/09/2016    Gurpreet Mariani Rober Minion PT, MPH  07/07/2020, 5:18 PM  [facility] [address] [facility] [place] [address] [place], [place], [address] Phone: [phone]   Fax:  [phone]  Name: Stacy Preston MRN: [id number] Date of Birth: [birth year]

## 2020-07-08 ENCOUNTER — Encounter: Payer: BC Managed Care – PPO | Admitting: Gastroenterology

## 2020-07-09 ENCOUNTER — Ambulatory Visit: Payer: BC Managed Care – PPO | Admitting: Rehabilitative and Restorative Service Providers"

## 2020-07-09 ENCOUNTER — Encounter: Payer: Self-pay | Admitting: Rehabilitative and Restorative Service Providers"

## 2020-07-09 ENCOUNTER — Other Ambulatory Visit: Payer: Self-pay

## 2020-07-09 DIAGNOSIS — M5416 Radiculopathy, lumbar region: Secondary | ICD-10-CM

## 2020-07-09 DIAGNOSIS — R29898 Other symptoms and signs involving the musculoskeletal system: Secondary | ICD-10-CM | POA: Diagnosis not present

## 2020-07-09 DIAGNOSIS — M6281 Muscle weakness (generalized): Secondary | ICD-10-CM | POA: Diagnosis not present

## 2020-07-09 NOTE — Therapy (Signed)
01/09/2016  . Cervical disc disorder with radiculopathy of cervical region 01/09/2016  . Elevated C-reactive protein (CRP) 01/09/2016    Jo-Anne Kluth Nilda Simmer PT, MPH  07/09/2020, 5:02 PM  [facility] [place] [address] [place], [place], [address] Phone: [phone]   Fax:  [phone]  Name: Stacy Preston MRN: [id number] Date of Birth: 10/15/1960  [place], [place], [address] Phone: [phone]   Fax:  [phone]  Physical Therapy Treatment  Patient Details  Name: Stacy Preston MRN: [id number] Date of Birth: [birth year] Referring Provider (PT): Dr Luetta Nutting   Encounter Date: 07/09/2020   PT End of Session - 07/09/20 1650    Visit Number 9    Number of Visits 12    Date for PT Re-Evaluation 07/16/20    PT Start Time 9528    PT Stop Time 1650    PT Time Calculation (min) 52 min    Activity Tolerance Patient tolerated treatment well           Past Medical History:  Diagnosis Date  . History of basal cell carcinoma 01/09/2016  . Hypertension   . Infertility, female   . Post-operative nausea and vomiting   . Prolactinoma ([place])   . Vitamin D deficiency 01/09/2016    Past Surgical History:  Procedure Laterality Date  . COLONOSCOPY  2013, 2017   [place]  . ivf    . PLANTAR FASCIA RELEASE    . PLANTAR FASCIA SURGERY      There were no vitals filed for this visit.   Subjective Assessment - 07/09/20 1559    Subjective Positive response to DN and manual work last visit. Pain free for the first time in ~ 4 months. Now has some pain more certral less in the Lt side of lower abdomen    Currently in Pain? Yes    Pain Score 3     Pain Location Groin    Pain Orientation Lower    Pain Descriptors / Indicators Aching   pinching   Pain Type Acute pain    Pain Onset More than a month ago    Pain Frequency Several days a week                             [facility] Adult PT Treatment/Exercise - 07/09/20 0001      Therapeutic Activites    Other Therapeutic Activities myofacial ball release bilat psoas lying prone; sitting on ball anterior to coccyx      Exercises   Exercises Other Exercises    Other Exercises  diaphragmatic breathing with emphasis through abdomen      Lumbar Exercises: Stretches   Other Lumbar  Stretch Exercise happy baby stretch 20-30 xec x 3 reps      Moist Heat Therapy   Number Minutes Moist Heat 10 Minutes    Moist Heat Location Other (comment)   abdominals and hip flexors bilat     Manual Therapy   Manual therapy comments skilled palpation to assess response to DN/manual work    Soft tissue mobilization bilat iliopsoas and hip adductor muscuature            Trigger Point Dry Needling - 07/09/20 0001    Consent Given? Yes    Education Handout Provided Previously provided    Dry Needling Comments bilat    Other Dry Needling lateral pubic bone    Adductor Response Palpable increased muscle length;Twitch response elicited    Iliopsoas Response Palpable increased muscle length                PT Education - 07/09/20 1646    Education Details HEP    Person(s) Educated Patient    Methods Explanation;Demonstration;Tactile cues;Verbal cues;Handout    Comprehension Verbalized understanding;Returned  01/09/2016  . Cervical disc disorder with radiculopathy of cervical region 01/09/2016  . Elevated C-reactive protein (CRP) 01/09/2016    Jo-Anne Kluth Nilda Simmer PT, MPH  07/09/2020, 5:02 PM  [facility] [address] [place], [place], [address] Phone: [phone]   Fax:  [phone]  Name: Stacy Preston MRN: [id number] Date of Birth: 10/15/1960

## 2020-07-09 NOTE — Patient Instructions (Signed)
Access Code: [email or web address]: [email or web address]: 05/18/2022Prepared by: Marijose Curington HoltExercises  Prone Press Up - 2 x daily - 7 x weekly - 1 sets - 10 reps - 2-3 sec hold  Prone Press Up On Elbows - 2 x daily - 7 x weekly - 1 sets - 3 reps - 30-60 sec hold  Hooklying Hamstring Stretch with Strap - 2 x daily - 7 x weekly - 1 sets - 3 reps - 30 sec hold  Seated Slump Nerve Glide (Mirrored) - 2 x daily - 7 x weekly - 1-2 sets - 10 reps  Sit to Stand - 2 x daily - 7 x weekly - 1 sets - 10 reps - 3-5 sec hold  Modified Thomas Stretch - 2 x daily - 7 x weekly - 1 sets - 3 reps - 30 sec hold  Supine Double Knee to Chest - 2 x daily - 7 x weekly - 1 sets - 3-5 reps - 10-15 sec hold  Wall Quarter Squat - 2 x daily - 7 x weekly - 1-2 sets - 10 reps - 5-10 sec hold  Single Leg Balance with Clock Reach - 2 x daily - 7 x weekly - 1 sets - 10 reps - 2 sec hold  Supine Piriformis Stretch with Leg Straight - 2 x daily - 7 x weekly - 1 sets - 3 reps - 30 sec hold  Supine Pelvic Floor Stretch - 2 x daily - 7 x weekly - 1 sets - 3 reps - 20 sec hold

## 2020-07-14 ENCOUNTER — Other Ambulatory Visit: Payer: Self-pay

## 2020-07-14 ENCOUNTER — Ambulatory Visit: Payer: BC Managed Care – PPO | Admitting: Rehabilitative and Restorative Service Providers"

## 2020-07-14 DIAGNOSIS — M5416 Radiculopathy, lumbar region: Secondary | ICD-10-CM | POA: Diagnosis not present

## 2020-07-14 DIAGNOSIS — M6281 Muscle weakness (generalized): Secondary | ICD-10-CM

## 2020-07-14 DIAGNOSIS — R29898 Other symptoms and signs involving the musculoskeletal system: Secondary | ICD-10-CM | POA: Diagnosis not present

## 2020-07-14 NOTE — Therapy (Signed)
[place], [place], [address] Phone: [phone]   Fax:  [phone]  Physical Therapy Treatment  Patient Details  Name: Stacy Preston MRN: [id number] Date of Birth: 02-Sep-1960 Referring Provider (PT): Dr Luetta Nutting   Encounter Date: 07/14/2020   PT End of Session - 07/14/20 1660    Visit Number 10    Number of Visits 12    Date for PT Re-Evaluation 07/16/20    PT Start Time 6301    PT Stop Time 1645    PT Time Calculation (min) 51 min           Past Medical History:  Diagnosis Date  . History of basal cell carcinoma 01/09/2016  . Hypertension   . Infertility, female   . Post-operative nausea and vomiting   . Prolactinoma ([place])   . Vitamin D deficiency 01/09/2016    Past Surgical History:  Procedure Laterality Date  . COLONOSCOPY  2013, 2017   [place]  . ivf    . PLANTAR FASCIA RELEASE    . PLANTAR FASCIA SURGERY      There were no vitals filed for this visit.   Subjective Assessment - 07/14/20 1554    Subjective Much better after last treatment but still has increased pain after a bowel movement or intercourse. Working on exercises.    Currently in Pain? Yes    Pain Score 3     Pain Location Groin    Pain Orientation Lower    Pain Descriptors / Indicators Aching   pinching   Pain Type Acute pain                             [facility] Adult PT Treatment/Exercise - 07/14/20 0001      Lumbar Exercises: Stretches   Other Lumbar Stretch Exercise happy baby stretch 20-30 xec x 3 reps      Lumbar Exercises: Quadruped   Madcat/Old Horse 5 reps    Other Quadruped Lumbar Exercises rocking with hips IR rocking fwd/back x 10-15 reps hold at backward rock where pt felt stretch in vaginal area 10-15 sec hold x 5 reps      Moist Heat Therapy   Number Minutes Moist Heat 10 Minutes    Moist Heat Location Other (comment);Lumbar Spine;Hip   abdominals and hip  flexors bilat     Manual Therapy   Manual therapy comments pt supine and prone    Joint Mobilization sacral PA mobs    Soft tissue mobilization bilat iliopsoas and hip adductor muscuature into the pubic rami area with pt supine; deep hip rotators into the coccyx and ischial tuberosities                       PT Long Term Goals - 07/14/20 1643      PT LONG TERM GOAL #1   Title Decrease pain LB and Lt LE by 75-100% allowing patient to return to all normal functional activities    Time 6    Period Weeks    Status Achieved      PT LONG TERM GOAL #2   Title Improve radicular symptoms Lt LE with patient report minimal to no radicular symptoms    Time 6    Period Weeks    Status Achieved      PT LONG TERM GOAL #3   Title Improve functional activities with patient to report  [id number] Date of Birth: [birth year]  [place], [place], [address] Phone: [phone]   Fax:  [phone]  Physical Therapy Treatment  Patient Details  Name: Stacy Preston MRN: [id number] Date of Birth: 02-Sep-1960 Referring Provider (PT): Dr Luetta Nutting   Encounter Date: 07/14/2020   PT End of Session - 07/14/20 1660    Visit Number 10    Number of Visits 12    Date for PT Re-Evaluation 07/16/20    PT Start Time 6301    PT Stop Time 1645    PT Time Calculation (min) 51 min           Past Medical History:  Diagnosis Date  . History of basal cell carcinoma 01/09/2016  . Hypertension   . Infertility, female   . Post-operative nausea and vomiting   . Prolactinoma ([place])   . Vitamin D deficiency 01/09/2016    Past Surgical History:  Procedure Laterality Date  . COLONOSCOPY  2013, 2017   [place]  . ivf    . PLANTAR FASCIA RELEASE    . PLANTAR FASCIA SURGERY      There were no vitals filed for this visit.   Subjective Assessment - 07/14/20 1554    Subjective Much better after last treatment but still has increased pain after a bowel movement or intercourse. Working on exercises.    Currently in Pain? Yes    Pain Score 3     Pain Location Groin    Pain Orientation Lower    Pain Descriptors / Indicators Aching   pinching   Pain Type Acute pain                             [facility] Adult PT Treatment/Exercise - 07/14/20 0001      Lumbar Exercises: Stretches   Other Lumbar Stretch Exercise happy baby stretch 20-30 xec x 3 reps      Lumbar Exercises: Quadruped   Madcat/Old Horse 5 reps    Other Quadruped Lumbar Exercises rocking with hips IR rocking fwd/back x 10-15 reps hold at backward rock where pt felt stretch in vaginal area 10-15 sec hold x 5 reps      Moist Heat Therapy   Number Minutes Moist Heat 10 Minutes    Moist Heat Location Other (comment);Lumbar Spine;Hip   abdominals and hip  flexors bilat     Manual Therapy   Manual therapy comments pt supine and prone    Joint Mobilization sacral PA mobs    Soft tissue mobilization bilat iliopsoas and hip adductor muscuature into the pubic rami area with pt supine; deep hip rotators into the coccyx and ischial tuberosities                       PT Long Term Goals - 07/14/20 1643      PT LONG TERM GOAL #1   Title Decrease pain LB and Lt LE by 75-100% allowing patient to return to all normal functional activities    Time 6    Period Weeks    Status Achieved      PT LONG TERM GOAL #2   Title Improve radicular symptoms Lt LE with patient report minimal to no radicular symptoms    Time 6    Period Weeks    Status Achieved      PT LONG TERM GOAL #3   Title Improve functional activities with patient to report

## 2020-07-23 ENCOUNTER — Ambulatory Visit (INDEPENDENT_AMBULATORY_CARE_PROVIDER_SITE_OTHER): Payer: BC Managed Care – PPO | Admitting: Rehabilitative and Restorative Service Providers"

## 2020-07-23 ENCOUNTER — Other Ambulatory Visit: Payer: Self-pay

## 2020-07-23 ENCOUNTER — Encounter: Payer: Self-pay | Admitting: Rehabilitative and Restorative Service Providers"

## 2020-07-23 DIAGNOSIS — M6281 Muscle weakness (generalized): Secondary | ICD-10-CM

## 2020-07-23 DIAGNOSIS — R29898 Other symptoms and signs involving the musculoskeletal system: Secondary | ICD-10-CM | POA: Diagnosis not present

## 2020-07-23 DIAGNOSIS — M5416 Radiculopathy, lumbar region: Secondary | ICD-10-CM

## 2020-07-23 NOTE — Therapy (Signed)
weeks    PT Treatment/Interventions ADLs/Self Care Home Management;Aquatic Therapy;Cryotherapy;Electrical Stimulation;Iontophoresis 4mg /ml Dexamethasone;Moist Heat;Traction;Ultrasound;Functional mobility training;Therapeutic activities;Therapeutic exercise;Neuromuscular re-education;Patient/family education;Manual techniques;Passive range of motion;Dry needling;Taping    PT Next Visit Plan core stabilization and strengthening; assess response to DN/manual work through anterior hips and adductors  as well as new exercise and myofacial ball release sitting    PT Home Exercise Plan [email or web address]    Consulted and Agree with Plan of Care Patient           Patient will benefit from skilled therapeutic intervention in order to improve the following deficits and impairments:     Visit Diagnosis: Radiculopathy, lumbar region  Other symptoms and signs involving the musculoskeletal system  Muscle weakness (generalized)     Problem List Patient Active Problem List   Diagnosis Date Noted  . Lumbar radiculopathy 05/20/2020  . Lower abdominal pain 05/20/2020  . Knee pain 05/20/2020  . Cervical stenosis (uterine cervix) 05/05/2020  . Pelvic and perineal pain 04/24/2020  . Cervical high risk HPV (human papillomavirus) test positive 01/08/2020  . Well adult exam 11/28/2019  . Elevated blood-pressure reading without diagnosis of hypertension 12/28/2018  . Tinnitus 11/27/2018  . GAD (generalized anxiety disorder) 12/21/2016  . Atrophic vaginitis 02/27/2016  . Vitamin D deficiency 01/09/2016  . History of prolactinoma 01/09/2016  . History of basal cell carcinoma 01/09/2016  . Cervical disc disorder with radiculopathy of cervical region 01/09/2016  . Elevated C-reactive protein (CRP) 01/09/2016    Dantae Meunier Nilda Simmer PT, MPH  07/23/2020, 3:25 PM  [facility] [place], [place], [address] Phone: [phone]   Fax:  [phone]  Name: Markayla Reichart MRN: [id number] Date of Birth: [birth year]  weeks    PT Treatment/Interventions ADLs/Self Care Home Management;Aquatic Therapy;Cryotherapy;Electrical Stimulation;Iontophoresis 4mg /ml Dexamethasone;Moist Heat;Traction;Ultrasound;Functional mobility training;Therapeutic activities;Therapeutic exercise;Neuromuscular re-education;Patient/family education;Manual techniques;Passive range of motion;Dry needling;Taping    PT Next Visit Plan core stabilization and strengthening; assess response to DN/manual work through anterior hips and adductors  as well as new exercise and myofacial ball release sitting    PT Home Exercise Plan [email or web address]    Consulted and Agree with Plan of Care Patient           Patient will benefit from skilled therapeutic intervention in order to improve the following deficits and impairments:     Visit Diagnosis: Radiculopathy, lumbar region  Other symptoms and signs involving the musculoskeletal system  Muscle weakness (generalized)     Problem List Patient Active Problem List   Diagnosis Date Noted  . Lumbar radiculopathy 05/20/2020  . Lower abdominal pain 05/20/2020  . Knee pain 05/20/2020  . Cervical stenosis (uterine cervix) 05/05/2020  . Pelvic and perineal pain 04/24/2020  . Cervical high risk HPV (human papillomavirus) test positive 01/08/2020  . Well adult exam 11/28/2019  . Elevated blood-pressure reading without diagnosis of hypertension 12/28/2018  . Tinnitus 11/27/2018  . GAD (generalized anxiety disorder) 12/21/2016  . Atrophic vaginitis 02/27/2016  . Vitamin D deficiency 01/09/2016  . History of prolactinoma 01/09/2016  . History of basal cell carcinoma 01/09/2016  . Cervical disc disorder with radiculopathy of cervical region 01/09/2016  . Elevated C-reactive protein (CRP) 01/09/2016    Dantae Meunier Nilda Simmer PT, MPH  07/23/2020, 3:25 PM  [facility] [place], [place], [address] Phone: [phone]   Fax:  [phone]  Name: Markayla Reichart MRN: [id number] Date of Birth: [birth year]  [place], [place], [address] Phone: [phone]   Fax:  [phone]  Physical Therapy Treatment  Patient Details  Name: Willamae Demby MRN: [id number] Date of Birth: 1960-12-27 Referring Provider (PT): Dr Luetta Nutting   Encounter Date: 07/23/2020   PT End of Session - 07/23/20 1435    Visit Number 11    Number of Visits 24    Date for PT Re-Evaluation 09/03/20    PT Start Time 6712    PT Stop Time 1520    PT Time Calculation (min) 48 min    Activity Tolerance Patient tolerated treatment well           Past Medical History:  Diagnosis Date  . History of basal cell carcinoma 01/09/2016  . Hypertension   . Infertility, female   . Post-operative nausea and vomiting   . Prolactinoma ([place])   . Vitamin D deficiency 01/09/2016    Past Surgical History:  Procedure Laterality Date  . COLONOSCOPY  2013, 2017   [place]  . ivf    . PLANTAR FASCIA RELEASE    . PLANTAR FASCIA SURGERY      There were no vitals filed for this visit.   Subjective Assessment - 07/23/20 1435    Subjective No issues with the back. Still having pain in the vaginal area with going up and down steps; wlaking; bowel mowel; intercourse. Working on her exercises at home    Patient Stated Goals get rid of pinching in the front    Currently in Pain? Yes    Pain Score 4     Pain Location Vagina    Pain Descriptors / Indicators Spasm   pinching   Pain Type Acute pain    Pain Onset More than a month ago    Pain Frequency Constant    Aggravating Factors  intermittently flares up with BM, sexual intercourse,steps    Pain Relieving Factors sometimes stretching              [facility] PT Assessment - 07/23/20 0001      Assessment   Medical Diagnosis Lumbar dysfunction    Referring Provider (PT) Dr Luetta Nutting    Onset Date/Surgical Date 02/21/20    Hand Dominance Right    Next MD Visit to schedule    Prior  Therapy here 2017 for cervical dysfunction      AROM   Lumbar Flexion 80%    Lumbar Extension 65%    Lumbar - Right Side Bend 85%    Lumbar - Left Side Bend 85% pulling Lt groin    Lumbar - Right Rotation 50%    Lumbar - Left Rotation 55%      Strength   Left Hip Flexion 4+/5    Left Hip Extension 4+/5    Left Hip ABduction 4+/5    Left Knee Flexion 5/5    Left Knee Extension 5/5      Flexibility   Hamstrings tight Lt    Quadriceps tight Lt ~ 90 deg    ITB tight Lt    Piriformis tight Lt      Palpation   Spinal mobility WFL's lumbar    Palpation comment muscular tightness bilat illiopsoas; hip adductors; rectus abdominis Lt > Rt                         [facility] Adult PT Treatment/Exercise - 07/23/20 0001

## 2020-07-30 ENCOUNTER — Ambulatory Visit: Payer: BC Managed Care – PPO | Attending: Family Medicine | Admitting: Physical Therapy

## 2020-07-30 ENCOUNTER — Encounter: Payer: Self-pay | Admitting: Physical Therapy

## 2020-07-30 ENCOUNTER — Telehealth: Payer: Self-pay

## 2020-07-30 ENCOUNTER — Other Ambulatory Visit: Payer: Self-pay

## 2020-07-30 DIAGNOSIS — R29898 Other symptoms and signs involving the musculoskeletal system: Secondary | ICD-10-CM | POA: Diagnosis not present

## 2020-07-30 DIAGNOSIS — M6281 Muscle weakness (generalized): Secondary | ICD-10-CM | POA: Diagnosis not present

## 2020-07-30 DIAGNOSIS — M5416 Radiculopathy, lumbar region: Secondary | ICD-10-CM

## 2020-07-30 NOTE — Addendum Note (Signed)
Addended by: Su Hoff on: 07/30/2020 02:47 PM   Modules accepted: Orders

## 2020-07-30 NOTE — Therapy (Signed)
[facility] [address], [place], [place], [address] Phone: [phone]   Fax:  [phone]  Physical Therapy Treatment  Patient Details  Name: Stacy Preston MRN: [id number] Date of Birth: [birth year] Referring Provider (PT): Dr Luetta Nutting   Encounter Date: 07/30/2020   PT End of Session - 07/30/20 1221    Visit Number 12    Number of Visits 24    Date for PT Re-Evaluation 09/03/20    PT Start Time 1100    PT Stop Time 1143    PT Time Calculation (min) 43 min    Activity Tolerance Patient tolerated treatment well    Behavior During Therapy [facility] for tasks assessed/performed           Past Medical History:  Diagnosis Date  . History of basal cell carcinoma 01/09/2016  . Hypertension   . Infertility, female   . Post-operative nausea and vomiting   . Prolactinoma ([place])   . Vitamin D deficiency 01/09/2016    Past Surgical History:  Procedure Laterality Date  . COLONOSCOPY  2013, 2017   [place]  . ivf    . PLANTAR FASCIA RELEASE    . PLANTAR FASCIA SURGERY      There were no vitals filed for this visit.   Subjective Assessment - 07/30/20 1104    Subjective Pt states there is pain after intercourse and after BMs.  Gets up to 8-9/10.    Pertinent History cervical dysfunction; GI problems; HTN; plantar fasciitis bilat feet; laproscopies 3-5 times    Patient Stated Goals get rid of pinching in the front    Pain Score 5     Pain Location Vagina    Pain Orientation Mid;Anterior    Pain Descriptors / Indicators Sharp    Pain Type Acute pain    Pain Onset More than a month ago    Pain Frequency Intermittent    Aggravating Factors  walking    Pain Relieving Factors sometimes lying down hurts but sometimes doesn't                          Pelvic Floor Special Questions - 07/30/20 0001    Marinoff Scale discomfort that does not affect completion    Urinary Leakage No    Urinary  frequency n    Fecal incontinence No   slight constipation at times   Pelvic Floor Internal Exam pt identity confirmed and internal soft tissue performed    Exam Type Vaginal    Palpation TTP levators and tight Lt>Rt    Strength fair squeeze, definite lift    Strength # of reps 3    Strength # of seconds 4    Tone high             [facility] Adult PT Treatment/Exercise - 07/30/20 0001      Therapeutic Activites    Other Therapeutic Activities fascial release on foam roll      Neuro Re-ed    Neuro Re-ed Details  breathing techniques for diaphragmatic breathing      Lumbar Exercises: Stretches   Other Lumbar Stretch Exercise happy baby stretch 20-30 xec x 3 reps      Manual Therapy   Myofascial Release MFR to lower abdomen - bladder and vaginal canal                  PT Education - 07/30/20 1151    Education Details  high risk HPV (human papillomavirus) test positive 01/08/2020  . Well adult exam 11/28/2019  . Elevated blood-pressure reading without diagnosis of hypertension 12/28/2018  . Tinnitus 11/27/2018  . GAD (generalized anxiety disorder) 12/21/2016  . Atrophic vaginitis 02/27/2016  . Vitamin D deficiency 01/09/2016  . History of prolactinoma 01/09/2016  . History of basal cell carcinoma 01/09/2016  . Cervical disc disorder with radiculopathy of cervical region 01/09/2016  . Elevated C-reactive protein (CRP) 01/09/2016    Jule Ser, PT 07/30/2020, 2:45 PM  [facility] [address], [place], [place], [address] Phone: [phone]   Fax:  [phone]  Name: Stacy Preston MRN: [id number] Date of Birth: 05-02-1960  high risk HPV (human papillomavirus) test positive 01/08/2020  . Well adult exam 11/28/2019  . Elevated blood-pressure reading without diagnosis of hypertension 12/28/2018  . Tinnitus 11/27/2018  . GAD (generalized anxiety disorder) 12/21/2016  . Atrophic vaginitis 02/27/2016  . Vitamin D deficiency 01/09/2016  . History of prolactinoma 01/09/2016  . History of basal cell carcinoma 01/09/2016  . Cervical disc disorder with radiculopathy of cervical region 01/09/2016  . Elevated C-reactive protein (CRP) 01/09/2016    Jule Ser, PT 07/30/2020, 2:45 PM  [facility] [address], [place], [place], [address] Phone: [phone]   Fax:  [phone]  Name: Stacy Preston MRN: [id number] Date of Birth: 05-02-1960

## 2020-07-30 NOTE — Telephone Encounter (Signed)
Stacy Preston lvm stating PT is not helping. During her appt the possibility of an MRI was discussed.   Requesting next steps.  Msg forwarded to Dr. Zigmund Daniel.

## 2020-07-30 NOTE — Patient Instructions (Signed)
Access Code: [email or web address] URL: [email or web address] Date: 07/30/2020 Prepared by: Jari Favre  Exercises Prone Press Up - 2 x daily - 7 x weekly - 1 sets - 10 reps - 2-3 sec hold Prone Press Up On Elbows - 2 x daily - 7 x weekly - 1 sets - 3 reps - 30-60 sec hold Hooklying Hamstring Stretch with Strap - 2 x daily - 7 x weekly - 1 sets - 3 reps - 30 sec hold Seated Slump Nerve Glide (Mirrored) - 2 x daily - 7 x weekly - 1-2 sets - 10 reps Sit to Stand - 2 x daily - 7 x weekly - 1 sets - 10 reps - 3-5 sec hold Modified Thomas Stretch - 2 x daily - 7 x weekly - 1 sets - 3 reps - 30 sec hold Supine Double Knee to Chest - 2 x daily - 7 x weekly - 1 sets - 3-5 reps - 10-15 sec hold Wall Quarter Squat - 2 x daily - 7 x weekly - 1-2 sets - 10 reps - 5-10 sec hold Single Leg Balance with Clock Reach - 2 x daily - 7 x weekly - 1 sets - 10 reps - 2 sec hold Supine Piriformis Stretch with Leg Straight - 2 x daily - 7 x weekly - 1 sets - 3 reps - 30 sec hold Supine Pelvic Floor Stretch - 2 x daily - 7 x weekly - 1 sets - 3 reps - 20 sec hold Standing Gastroc Stretch - 1 x daily - 7 x weekly - 3 reps - 1 sets - 30 hold Supine Diaphragmatic Breathing with Pelvic Floor Lengthening - 3 x daily - 7 x weekly - 10 reps - 1 sets

## 2020-07-31 NOTE — Telephone Encounter (Signed)
We can get MRI of pelvis to get a better look at pelvic muscles and structures.

## 2020-08-01 ENCOUNTER — Other Ambulatory Visit: Payer: Self-pay | Admitting: Family Medicine

## 2020-08-01 DIAGNOSIS — R102 Pelvic and perineal pain: Secondary | ICD-10-CM

## 2020-08-01 DIAGNOSIS — G8929 Other chronic pain: Secondary | ICD-10-CM

## 2020-08-01 NOTE — Telephone Encounter (Signed)
Has had Pelvic US,  CT abdomen/pelvis, >6 weeks of physical therapy and continues to have pelvic pain.  Orders entered.

## 2020-08-03 ENCOUNTER — Emergency Department (HOSPITAL_BASED_OUTPATIENT_CLINIC_OR_DEPARTMENT_OTHER)
Admission: EM | Admit: 2020-08-03 | Discharge: 2020-08-03 | Disposition: A | Discharge status: Home / Self Care | Payer: BC Managed Care – PPO | Attending: Emergency Medicine | Admitting: Emergency Medicine

## 2020-08-03 ENCOUNTER — Emergency Department (HOSPITAL_BASED_OUTPATIENT_CLINIC_OR_DEPARTMENT_OTHER): Payer: BC Managed Care – PPO

## 2020-08-03 ENCOUNTER — Encounter (HOSPITAL_BASED_OUTPATIENT_CLINIC_OR_DEPARTMENT_OTHER): Payer: Self-pay | Admitting: Emergency Medicine

## 2020-08-03 ENCOUNTER — Other Ambulatory Visit: Payer: Self-pay

## 2020-08-03 DIAGNOSIS — Z79899 Other long term (current) drug therapy: Secondary | ICD-10-CM | POA: Diagnosis not present

## 2020-08-03 DIAGNOSIS — M25561 Pain in right knee: Secondary | ICD-10-CM | POA: Diagnosis not present

## 2020-08-03 DIAGNOSIS — I10 Essential (primary) hypertension: Secondary | ICD-10-CM | POA: Diagnosis not present

## 2020-08-03 DIAGNOSIS — S8991XA Unspecified injury of right lower leg, initial encounter: Secondary | ICD-10-CM | POA: Diagnosis not present

## 2020-08-03 DIAGNOSIS — M25569 Pain in unspecified knee: Secondary | ICD-10-CM

## 2020-08-03 DIAGNOSIS — S8981XA Other specified injuries of right lower leg, initial encounter: Secondary | ICD-10-CM | POA: Diagnosis not present

## 2020-08-03 DIAGNOSIS — M1711 Unilateral primary osteoarthritis, right knee: Secondary | ICD-10-CM | POA: Diagnosis not present

## 2020-08-03 NOTE — Discharge Instructions (Addendum)
There is some mild arthritis on x-ray and the ultrasound showed swelling in that area where you initially felt the pain but no sign of Baker cysts or clots.  Wear the knee sleeve at all times when you are up moving around.  When you are sitting try to elevate the knee and apply ice.  Also taking ibuprofen, naproxen or Aleve to help with inflammation would be useful in the next 1 week.  Use the crutches as often as you need to if it is too painful to bear weight.

## 2020-08-03 NOTE — ED Provider Notes (Signed)
[facility] EMERGENCY DEPARTMENT Provider Note   CSN: [id number] Arrival date & time: 08/03/20  1710     History Chief Complaint  Patient presents with   Knee Injury    Stacy Preston is a 60 y.o. female.  Patient is a 60 year old female with a history of hypertension, recent ongoing issues with back pain and pelvic floor laxity who is presenting today with abrupt onset of pain in her right knee.  Patient states over the last few days she has noted some achiness in the right knee but today she was walking out of [employer] when she suddenly felt a pop in the lateral portion of her right knee and then severe pain in the back of the knee that made it difficult to walk.  She did not trip or twist it when this occurred.  She has had ongoing pain since that time and reports that her foot feels a little tingly.  She has had no prior history of issues with the right knee.  She takes no anticoagulation.  Symptoms are made worse when trying to walk and bending the knee in a certain way.  The history is provided by the patient.      Past Medical History:  Diagnosis Date   History of basal cell carcinoma 01/09/2016   Hypertension    Infertility, female    Post-operative nausea and vomiting    Prolactinoma ([place])    Vitamin D deficiency 01/09/2016    Patient Active Problem List   Diagnosis Date Noted   Lumbar radiculopathy 05/20/2020   Lower abdominal pain 05/20/2020   Knee pain 05/20/2020   Cervical stenosis (uterine cervix) 05/05/2020   Pelvic and perineal pain 04/24/2020   Cervical high risk HPV (human papillomavirus) test positive 01/08/2020   Well adult exam 11/28/2019   Elevated blood-pressure reading without diagnosis of hypertension 12/28/2018   Tinnitus 11/27/2018   GAD (generalized anxiety disorder) 12/21/2016   Atrophic vaginitis 02/27/2016   Vitamin D deficiency 01/09/2016   History of prolactinoma 01/09/2016   History of basal cell carcinoma 01/09/2016    Cervical disc disorder with radiculopathy of cervical region 01/09/2016   Elevated C-reactive protein (CRP) 01/09/2016    Past Surgical History:  Procedure Laterality Date   COLONOSCOPY  2013, 2017   [place]   ivf     PLANTAR FASCIA RELEASE     PLANTAR FASCIA SURGERY       OB History     Gravida  1   Para      Term      Preterm      AB  1   Living         SAB  1   IAB      Ectopic      Multiple      Live Births              Family History  Problem Relation Age of Onset   Renal Disease Mother    Diabetes Mother    Hypertension Mother    Cancer Father        Lung CA   Alcohol abuse Brother    Hypertension Brother    Sickle cell anemia Brother    Cancer Brother    Colon polyps Brother    Diabetes Maternal Aunt    Heart disease Maternal Grandmother    Diabetes Maternal Grandmother    Cancer Maternal Grandfather    Diabetes Maternal Grandfather    Colon cancer  Maternal Grandfather    Stroke Paternal Grandmother    Heart disease Paternal Grandfather    Esophageal cancer Neg Hx    Rectal cancer Neg Hx    Stomach cancer Neg Hx     Social History   Tobacco Use   Smoking status: Never   Smokeless tobacco: Never  Vaping Use   Vaping Use: Never used  Substance Use Topics   Alcohol use: Yes    Alcohol/week: 2.0 standard drinks    Types: 2 Standard drinks or equivalent per week   Drug use: No    Home Medications Prior to Admission medications   Medication Sig Start Date End Date Taking? Authorizing Provider  Albuterol Sulfate (PROAIR RESPICLICK) 169 (90 Base) MCG/ACT AEPB Inhale 2 puffs into the lungs every 6 (six) hours as needed. Patient not taking: No sig reported 02/28/20   Samuel Bouche, NP  ANUCORT-HC 25 MG suppository INSERT 1 SUPPOSITORY RECTALLY TWICE DAILY AS NEEDED FOR HEMORRHOIDS Patient not taking: No sig reported 01/16/20   Luetta Nutting, DO  cholecalciferol (VITAMIN D) 1000 UNITS tablet Take 2,000 Units by mouth daily.     [provider]  estradiol (ESTRACE) 0.1 MG/GM vaginal cream Per vagina three times per week Patient taking differently: 2 (two) times a week. Per vagina three times per week 12/19/17   Emily Filbert, MD  losartan (COZAAR) 25 MG tablet Take 1 tablet by mouth once daily 06/13/20   Luetta Nutting, DO  medium chain triglycerides (MCT OIL) oil Take by mouth 3 (three) times daily.    [provider]  phentermine 37.5 MG capsule Take 37.5 mg by mouth every morning.    [provider]  Sodium Sulfate-Mag Sulfate-KCl (SUTAB) [phone] MG TABS Take 1 kit by mouth as directed. MANUFACTURER CODES!! BIN: [id number] PCN: CN GROUP: [id number] MEMBER ID: [id number];[employer] AS SECONDARY INSURANCE ;NO PRIOR AUTHORIZATION 06/30/20   Cirigliano, Vito V, DO  vitamin C (ASCORBIC ACID) 500 MG tablet Take 500 mg by mouth daily.    [provider]    Allergies    Lactose intolerance (gi)  Review of Systems   Review of Systems  All other systems reviewed and are negative.  Physical Exam Updated Vital Signs BP (!) 150/62 (BP Location: Left Arm)   Pulse 99   Temp 98.6 F (37 C) (Oral)   Resp 18   Ht 5' 3"  (1.6 m)   Wt 81.6 kg   SpO2 98%   BMI 31.89 kg/m   Physical Exam Vitals and nursing note reviewed.  Constitutional:      General: She is not in acute distress.    Appearance: Normal appearance.  HENT:     Head: Normocephalic.  Eyes:     Pupils: Pupils are equal, round, and reactive to light.  Cardiovascular:     Rate and Rhythm: Normal rate and regular rhythm.     Pulses: Normal pulses.  Pulmonary:     Effort: Pulmonary effort is normal. No respiratory distress.     Breath sounds: Normal breath sounds. No wheezing.  Musculoskeletal:        General: Tenderness present.     Right knee: No swelling. Normal range of motion. Tenderness present over the LCL.     Comments: Mild tenderness over the LCL but also tenderness in the popliteal fossa and proximal calf.  2+ DP pulse  in the right foot with normal sensation of all 5 toes.  Skin:    Capillary Refill: Capillary refill takes less than  2 seconds.  Neurological:     General: No focal deficit present.     Mental Status: She is alert and oriented to person, place, and time. Mental status is at baseline.  Psychiatric:        Mood and Affect: Mood normal.        Behavior: Behavior normal.    ED Results / Procedures / Treatments   Labs (all labs ordered are listed, but only abnormal results are displayed) Labs Reviewed - No data to display  EKG None  Radiology DG Knee Complete 4 Views Right  Result Date: 08/03/2020 CLINICAL DATA:  Pain and unable to bear weight after feeling a pop in the right knee. EXAM: RIGHT KNEE - COMPLETE 4+ VIEW COMPARISON:  09/26/2018 FINDINGS: No evidence of fracture, dislocation, or joint effusion. No evidence of arthropathy or other focal bone abnormality. Soft tissues are unremarkable. Mild degenerative changes in all 3 compartments. IMPRESSION: No acute bony abnormalities.  Mild degenerative changes. Electronically Signed   By: Lucienne Capers M.D.   On: 08/03/2020 18:22   [place] RT LOWER EXTREM LTD SOFT TISSUE NON VASCULAR  Result Date: 08/03/2020 CLINICAL DATA:  Patient was walking and felt a pop along the lateral side of the right patella. Pain and tightness laterally. EXAM: ULTRASOUND right LOWER EXTREMITY LIMITED TECHNIQUE: Ultrasound examination of the lower extremity soft tissues was performed in the area of clinical concern. COMPARISON:  Right knee radiograph 08/03/2020 FINDINGS: Joint Space: No effusion. Muscles: Normal. Tendons: Normal Other Soft Tissue Structures: Minimal fluid seen around the lateral side of the right patella corresponding to area pain. IMPRESSION: Minimal fluid around the lateral side of the right patella corresponding to the area pain. Electronically Signed   By: Lucienne Capers M.D.   On: 08/03/2020 19:04    Procedures Procedures   Medications Ordered  in ED Medications - No data to display  ED Course  I have reviewed the triage vital signs and the nursing notes.  Pertinent labs & imaging results that were available during my care of the patient were reviewed by me and considered in my medical decision making (see chart for details).    MDM Rules/Calculators/A&P                          Patient presenting today with a sudden pop in her right knee and sudden pain that she felt initially on the lateral side of her knee but is now in the posterior portion of her knee.  No trauma associated with this.  No significant effusion on exam.  She is able to range but slightly limited related to pain.  Pulse and sensation are intact.  Concern for possible Baker's cyst rupture versus ligamental injury versus arthritis.  We will start with plain film.  Low suspicion for DVT at this time.  7:16 PM X-ray with mild arthritis and ultrasound that shows some soft tissue swelling along the lateral portion of the patella but no evidence of Baker's cyst.  We will treat patient for knee sprain.  She was given crutches due to pain with weightbearing.  She has a sports medicine physician already that she can follow-up with.  MDM   Amount and/or Complexity of Data Reviewed Tests in the radiology section of CPT: ordered and reviewed Independent visualization of images, tracings, or specimens: yes    Final Clinical Impression(s) / ED Diagnoses Final diagnoses:  Acute pain of right knee    Rx /  DC Orders ED Discharge Orders     None        Blanchie Dessert, MD 08/03/20 [phone]

## 2020-08-03 NOTE — ED Triage Notes (Signed)
Pt arrives pov with c/o R leg pain and posterior knee pain after feeling "pop" when ambulating in parking lot. Pt c/o lateral pain and R foot tingling. Pt endorses pain when attempting to bear weight.

## 2020-08-04 ENCOUNTER — Ambulatory Visit: Payer: Self-pay

## 2020-08-04 ENCOUNTER — Other Ambulatory Visit: Payer: Self-pay

## 2020-08-04 ENCOUNTER — Ambulatory Visit: Payer: BC Managed Care – PPO | Admitting: Physical Therapy

## 2020-08-04 ENCOUNTER — Ambulatory Visit: Payer: BC Managed Care – PPO | Admitting: Family Medicine

## 2020-08-04 ENCOUNTER — Encounter: Payer: Self-pay | Admitting: Family Medicine

## 2020-08-04 VITALS — BP 140/80 | HR 85 | Ht 63.0 in | Wt 180.0 lb

## 2020-08-04 DIAGNOSIS — M25561 Pain in right knee: Secondary | ICD-10-CM

## 2020-08-04 NOTE — Progress Notes (Signed)
Stacy Preston, am serving as a Education administrator for Dr. [name].   Stacy Preston is a 60 y.o. female who presents to [place] at [facility] today for R knee pain. Pt was last seen by Dr. Georgina Snell on 03/07/20 for chronic L knee pain and lumbar radiculopathy and was given a L knee steroid injection. Pt was seen at the [facility] ED on 08/03/20 stating over the last few days she has noted some achiness in the right knee but today she was walking out of [employer] when she suddenly felt a pop in the lateral portion of her right knee and then severe pain in the back of the knee and some tingling in her R foot. Pt notes she did not trip or twist it when this occurred.  Pt locates pain to Lateral R knee.  She has a planned trip to [place] in 1 week.  R knee swelling: yes  Aggravates: any weight Treatments tried: brace crutches ibuprofen   Dx imaging: 08/03/20 R knee XR  Pertinent review of systems: No fevers or chills  Relevant historical information: Generalized anxiety disorder   Exam:  BP 140/80 (BP Location: Left Arm, Patient Position: Sitting, Cuff Size: Normal)   Pulse 85   Ht 5\' 3"  (1.6 m)   Wt 180 lb (81.6 kg)   SpO2 99%   BMI 31.89 kg/m  General: Well Developed, well nourished, and in no acute distress.   MSK: Right knee mild effusion. Decreased range of motion. Tender palpation lateral joint line. Guarding with ligament exam testing however no laxity to LCL test. Positive lateral McMurray's test. Intact strength. Antalgic gait.    Lab and Radiology Results No results found for this or any previous visit (from the past 72 hour(s)). DG Knee Complete 4 Views Right  Result Date: 08/03/2020 CLINICAL DATA:  Pain and unable to bear weight after feeling a pop in the right knee. EXAM: RIGHT KNEE - COMPLETE 4+ VIEW COMPARISON:  09/26/2018 FINDINGS: No evidence of fracture, dislocation, or joint effusion. No evidence of arthropathy or other focal bone  abnormality. Soft tissues are unremarkable. Mild degenerative changes in all 3 compartments. IMPRESSION: No acute bony abnormalities.  Mild degenerative changes. Electronically Signed   By: Lucienne Capers M.D.   On: 08/03/2020 18:22   [place] RT LOWER EXTREM LTD SOFT TISSUE NON VASCULAR  Result Date: 08/03/2020 CLINICAL DATA:  Patient was walking and felt a pop along the lateral side of the right patella. Pain and tightness laterally. EXAM: ULTRASOUND right LOWER EXTREMITY LIMITED TECHNIQUE: Ultrasound examination of the lower extremity soft tissues was performed in the area of clinical concern. COMPARISON:  Right knee radiograph 08/03/2020 FINDINGS: Joint Space: No effusion. Muscles: Normal. Tendons: Normal Other Soft Tissue Structures: Minimal fluid seen around the lateral side of the right patella corresponding to area pain. IMPRESSION: Minimal fluid around the lateral side of the right patella corresponding to the area pain. Electronically Signed   By: Lucienne Capers M.D.   On: 08/03/2020 19:04   I, [name], personally (independently) visualized and performed the interpretation of the images attached in this note.   Procedure: Real-time Ultrasound Guided Injection of right knee superior lateral patellar space Device: Philips Affiniti 50G Images permanently stored and available for review in PACS Verbal informed consent obtained.  Discussed risks and benefits of procedure. Warned about infection bleeding damage to structures skin hypopigmentation and fat atrophy among others. Patient expresses understanding and agreement Time-out conducted.  Noted no overlying erythema, induration, or other signs of local infection.   Skin prepped in a sterile fashion.   Local anesthesia: Topical Ethyl chloride.   With sterile technique and under real time ultrasound guidance: 40 mg of Kenalog and 2 mL of Marcaine injected into knee joint. Fluid seen entering the joint capsule.   Completed without difficulty    Pain immediately resolved suggesting accurate placement of the medication.   Advised to call if fevers/chills, erythema, induration, drainage, or persistent bleeding.   Images permanently stored and available for review in the ultrasound unit.  Impression: Technically successful ultrasound guided injection.      Assessment and Plan: 60 y.o. female with right knee pain.  Patient felt a pop with walking and has developed significant pain and swelling.  No fracture seen on x-ray.  Patient has good stability testing on exam today indicating LCL tear is unlikely.  Most likely explanation is an MCL tear.  Plan for steroid injection and advancing activity as tolerated.  If not improving would recommend MRI as next step.  She has a trip to [place] in 1 week.  Fortunately she does have travel insurance.  I think that she probably should not go however she gets significantly better she can.  She will contact me later this week to inform me how she is feeling and I can make a travel determination at that time.  I will complete paperwork and forms as needed.   PDMP not reviewed this encounter. Orders Placed This Encounter  Procedures   [place] LIMITED JOINT SPACE STRUCTURES LOW RIGHT(NO LINKED CHARGES)    Standing Status:   Future    Number of Occurrences:   1    Standing Expiration Date:   08/04/2021    Order Specific Question:   Reason for Exam (SYMPTOM  OR DIAGNOSIS REQUIRED)    Answer:   right knee pain    Order Specific Question:   Preferred imaging location?    Answer:   Internal   No orders of the defined types were placed in this encounter.    Discussed warning signs or symptoms. Please see discharge instructions. Patient expresses understanding.   The above documentation has been reviewed and is accurate and complete [name], M.D.

## 2020-08-04 NOTE — Telephone Encounter (Signed)
Pt is scheduled for a visit today, 6/13.

## 2020-08-04 NOTE — Patient Instructions (Signed)
Thank you for coming in today.   Call or go to the ER if you develop a large red swollen joint with extreme pain or oozing puss.    Do not travel  if you cannot walk normally.

## 2020-08-05 ENCOUNTER — Ambulatory Visit: Payer: BC Managed Care – PPO | Admitting: Family Medicine

## 2020-08-05 ENCOUNTER — Encounter: Payer: Self-pay | Admitting: Gastroenterology

## 2020-08-05 ENCOUNTER — Ambulatory Visit (AMBULATORY_SURGERY_CENTER): Payer: BC Managed Care – PPO | Admitting: Gastroenterology

## 2020-08-05 VITALS — BP 123/78 | HR 81 | Temp 97.8°F | Resp 10 | Ht 61.0 in | Wt 192.0 lb

## 2020-08-05 DIAGNOSIS — D124 Benign neoplasm of descending colon: Secondary | ICD-10-CM

## 2020-08-05 DIAGNOSIS — Z1211 Encounter for screening for malignant neoplasm of colon: Secondary | ICD-10-CM | POA: Diagnosis not present

## 2020-08-05 DIAGNOSIS — D120 Benign neoplasm of cecum: Secondary | ICD-10-CM

## 2020-08-05 DIAGNOSIS — K621 Rectal polyp: Secondary | ICD-10-CM | POA: Diagnosis not present

## 2020-08-05 DIAGNOSIS — D128 Benign neoplasm of rectum: Secondary | ICD-10-CM

## 2020-08-05 DIAGNOSIS — K635 Polyp of colon: Secondary | ICD-10-CM | POA: Diagnosis not present

## 2020-08-05 DIAGNOSIS — K640 First degree hemorrhoids: Secondary | ICD-10-CM

## 2020-08-05 DIAGNOSIS — D125 Benign neoplasm of sigmoid colon: Secondary | ICD-10-CM

## 2020-08-05 MED ORDER — SODIUM CHLORIDE 0.9 % IV SOLN: 500.0000 mL | Freq: Once | INTRAVENOUS | Status: DC

## 2020-08-05 NOTE — Op Note (Signed)
[facility] Patient Name: Stacy Preston Procedure Date: 08/05/2020 2:58 PM MRN: [id number] Endoscopist: Doristine Locks , MD Age: 60 Referring MD:  Date of Birth: [birth year] Gender: Female Account #: [id number] Procedure:                Colonoscopy Indications:              Surveillance: Personal history of colonic polyps                            (unknown histology) on last colonoscopy 5 years ago                           - Colonoscopy 09/2011 with polyp of unknown size,                            histology, location                           - Colonoscopy 2017 was normal, with recommendation                            to repeat in 5 years                           - Family history notable for maternal grandfatehr                            with colon cancer Medicines:                Monitored Anesthesia Care Procedure:                Pre-Anesthesia Assessment:                           - Prior to the procedure, a History and Physical                            was performed, and patient medications and                            allergies were reviewed. The patient's tolerance of                            previous anesthesia was also reviewed. The risks                            and benefits of the procedure and the sedation                            options and risks were discussed with the patient.                            All questions were answered, and informed consent  was obtained. Prior Anticoagulants: The patient has                            taken no previous anticoagulant or antiplatelet                            agents. ASA Grade Assessment: II - A patient with                            mild systemic disease. After reviewing the risks                            and benefits, the patient was deemed in                            satisfactory condition to undergo the procedure.                           After obtaining  informed consent, the colonoscope                            was passed under direct vision. Throughout the                            procedure, the patient's blood pressure, pulse, and                            oxygen saturations were monitored continuously. The                            Olympus CF-HQ190 [id number]) Colonoscope was                            introduced through the anus and advanced to the the                            cecum, identified by appendiceal orifice and                            ileocecal valve. The colonoscopy was performed                            without difficulty. The patient tolerated the                            procedure well. The quality of the bowel                            preparation was good. The ileocecal valve,                            appendiceal orifice, and rectum were photographed. Scope In: 3:00:45 PM Scope Out: 3:21:43 PM Scope Withdrawal Time: 0 hours 17 minutes 55 seconds  Total Procedure Duration: 0  hours 20 minutes 58 seconds  Findings:                 The perianal and digital rectal examinations were                            normal.                           Three sessile polyps were found in the descending                            colon and cecum (2). The polyps were 3 to 5 mm in                            size. These polyps were removed with a cold snare.                            Resection and retrieval were complete. Estimated                            blood loss was minimal.                           A 8 mm polyp was found in the sigmoid colon. The                            polyp was flat and had an adherent mucus cap. The                            polyp was removed with a cold snare. Resection and                            retrieval were complete. Estimated blood loss was                            minimal.                           A 3 mm polyp was found in the rectum. The polyp was                             sessile. The polyp was removed with a cold snare.                            Resection and retrieval were complete. Estimated                            blood loss was minimal.                           Non-bleeding internal hemorrhoids were found during  retroflexion. The hemorrhoids were small. Complications:            No immediate complications. Estimated Blood Loss:     Estimated blood loss was minimal. Impression:               - Three 3 to 5 mm polyps in the descending colon                            and in the cecum, removed with a cold snare.                            Resected and retrieved.                           - One 8 mm polyp in the sigmoid colon, removed with                            a cold snare. Resected and retrieved.                           - One 3 mm polyp in the rectum, removed with a cold                            snare. Resected and retrieved.                           - Non-bleeding internal hemorrhoids. Recommendation:           - Patient has a contact number available for                            emergencies. The signs and symptoms of potential                            delayed complications were discussed with the                            patient. Return to normal activities tomorrow.                            Written discharge instructions were provided to the                            patient.                           - Resume previous diet.                           - Continue present medications.                           - Await pathology results.                           - Repeat colonoscopy for surveillance based on  pathology results.                           - Return to GI clinic PRN.                           - Use fiber, for example Citrucel, Fibercon, Konsyl                            or Metamucil.                           - Internal hemorrhoids were noted on this study and                             may be amenable to hemorrhoid band ligation. If you                            are interested in further treatment of these                            hemorrhoids with band ligation, please contact my                            clinic to set up an appointment for evaluation and                            treatment. Doristine Locks, MD 08/05/2020 3:27:38 PM

## 2020-08-05 NOTE — Progress Notes (Signed)
Report given to PACU, vss 

## 2020-08-05 NOTE — Patient Instructions (Signed)
Handouts given:  hemorrhoids, polyps, hemorrhoidal banding Resume previous diet Continue current medications Await pathology results Use fiber : citrucel, fibercon, konsyl or metamucil  YOU HAD AN ENDOSCOPIC PROCEDURE TODAY AT [facility]:   Refer to the procedure report that was given to you for any specific questions about what was found during the examination.  If the procedure report does not answer your questions, please call your gastroenterologist to clarify.  If you requested that your care partner not be given the details of your procedure findings, then the procedure report has been included in a sealed envelope for you to review at your convenience later.  YOU SHOULD EXPECT: Some feelings of bloating in the abdomen. Passage of more gas than usual.  Walking can help get rid of the air that was put into your GI tract during the procedure and reduce the bloating. If you had a lower endoscopy (such as a colonoscopy or flexible sigmoidoscopy) you may notice spotting of blood in your stool or on the toilet paper. If you underwent a bowel prep for your procedure, you may not have a normal bowel movement for a few days.  Please Note:  You might notice some irritation and congestion in your nose or some drainage.  This is from the oxygen used during your procedure.  There is no need for concern and it should clear up in a day or so.  SYMPTOMS TO REPORT IMMEDIATELY:  Following lower endoscopy (colonoscopy or flexible sigmoidoscopy):  Excessive amounts of blood in the stool  Significant tenderness or worsening of abdominal pains  Swelling of the abdomen that is new, acute  Fever of 100F or higher  For urgent or emergent issues, a gastroenterologist can be reached at any hour by calling [phone]. Do not use MyChart messaging for urgent concerns.   DIET:  We do recommend a small meal at first, but then you may proceed to your regular diet.  Drink plenty of fluids but you  should avoid alcoholic beverages for 24 hours.  ACTIVITY:  You should plan to take it easy for the rest of today and you should NOT DRIVE or use heavy machinery until tomorrow (because of the sedation medicines used during the test).    FOLLOW UP: Our staff will call the number listed on your records 48-72 hours following your procedure to check on you and address any questions or concerns that you may have regarding the information given to you following your procedure. If we do not reach you, we will leave a message.  We will attempt to reach you two times.  During this call, we will ask if you have developed any symptoms of COVID 19. If you develop any symptoms (ie: fever, flu-like symptoms, shortness of breath, cough etc.) before then, please call [phone].  If you test positive for Covid 19 in the 2 weeks post procedure, please call and report this information to [place].    If any biopsies were taken you will be contacted by phone or by letter within the next 1-3 weeks.  Please call us at [phone] if you have not heard about the biopsies in 3 weeks.   SIGNATURES/CONFIDENTIALITY: You and/or your care partner have signed paperwork which will be entered into your electronic medical record.  These signatures attest to the fact that that the information above on your After Visit Summary has been reviewed and is understood.  Full responsibility of the confidentiality of this discharge information lies with you  and/or your care-partner.

## 2020-08-05 NOTE — Progress Notes (Signed)
Vs in adm by CW 

## 2020-08-06 ENCOUNTER — Encounter: Payer: Self-pay | Admitting: Family Medicine

## 2020-08-06 DIAGNOSIS — M25561 Pain in right knee: Secondary | ICD-10-CM

## 2020-08-07 ENCOUNTER — Telehealth: Payer: Self-pay | Admitting: *Deleted

## 2020-08-07 NOTE — Telephone Encounter (Signed)
  Follow up Call-  Call back number 08/05/2020  Post procedure Call Back phone  # [phone]  Permission to leave phone message Yes  Some recent data might be hidden     Patient questions:  Do you have a fever, pain , or abdominal swelling? No. Pain Score  0 *  Have you tolerated food without any problems? Yes.    Have you been able to return to your normal activities? Yes.    Do you have any questions about your discharge instructions: Diet   No. Medications  No. Follow up visit  No.  Do you have questions or concerns about your Care? No.  Actions: * If pain score is 4 or above: No action needed, pain <4.  Have you developed a fever since your procedure? no  2.   Have you had an respiratory symptoms (SOB or cough) since your procedure? no  3.   Have you tested positive for COVID 19 since your procedure no  4.   Have you had any family members/close contacts diagnosed with the COVID 19 since your procedure?  no   If yes to any of these questions please route to Joylene John, RN and Joella Prince, RN

## 2020-08-09 ENCOUNTER — Other Ambulatory Visit: Payer: Self-pay

## 2020-08-09 ENCOUNTER — Ambulatory Visit (INDEPENDENT_AMBULATORY_CARE_PROVIDER_SITE_OTHER): Payer: BC Managed Care – PPO

## 2020-08-09 DIAGNOSIS — M25561 Pain in right knee: Secondary | ICD-10-CM | POA: Diagnosis not present

## 2020-08-09 DIAGNOSIS — S83241A Other tear of medial meniscus, current injury, right knee, initial encounter: Secondary | ICD-10-CM | POA: Diagnosis not present

## 2020-08-09 DIAGNOSIS — M25461 Effusion, right knee: Secondary | ICD-10-CM | POA: Diagnosis not present

## 2020-08-11 ENCOUNTER — Ambulatory Visit (INDEPENDENT_AMBULATORY_CARE_PROVIDER_SITE_OTHER): Payer: BC Managed Care – PPO

## 2020-08-11 ENCOUNTER — Encounter: Payer: Self-pay | Admitting: Family Medicine

## 2020-08-11 ENCOUNTER — Other Ambulatory Visit: Payer: Self-pay

## 2020-08-11 DIAGNOSIS — G8929 Other chronic pain: Secondary | ICD-10-CM | POA: Diagnosis not present

## 2020-08-11 DIAGNOSIS — R102 Pelvic and perineal pain: Secondary | ICD-10-CM

## 2020-08-11 DIAGNOSIS — M160 Bilateral primary osteoarthritis of hip: Secondary | ICD-10-CM | POA: Diagnosis not present

## 2020-08-11 DIAGNOSIS — K5730 Diverticulosis of large intestine without perforation or abscess without bleeding: Secondary | ICD-10-CM | POA: Diagnosis not present

## 2020-08-11 MED ORDER — GADOBUTROL 1 MMOL/ML IV SOLN
9.0000 mL | Freq: Once | INTRAVENOUS | Status: AC | PRN
  Administered 2020-08-11: 10 mL via INTRAVENOUS

## 2020-08-11 NOTE — Telephone Encounter (Signed)
Patient called in regards to her message below. Please advise.

## 2020-08-11 NOTE — Progress Notes (Signed)
MRI knee shows a medial meniscus tear with extrusion.  Additionally shows some arthritis in the knee.  Either return to clinic with me to go over the results of full detail or I could refer you to orthopedic surgery to have the surgical consultation for this.  Please let me know what you would like.

## 2020-08-12 ENCOUNTER — Encounter: Payer: Self-pay | Admitting: Family Medicine

## 2020-08-12 ENCOUNTER — Ambulatory Visit: Payer: BC Managed Care – PPO | Admitting: Family Medicine

## 2020-08-12 VITALS — BP 120/70 | HR 88 | Ht 61.0 in | Wt 177.0 lb

## 2020-08-12 DIAGNOSIS — M25561 Pain in right knee: Secondary | ICD-10-CM

## 2020-08-12 DIAGNOSIS — S83241A Other tear of medial meniscus, current injury, right knee, initial encounter: Secondary | ICD-10-CM

## 2020-08-12 NOTE — Telephone Encounter (Signed)
Pt will be seen in clinic this afternoon, 08/12/20.

## 2020-08-12 NOTE — Progress Notes (Signed)
I, Wendy Poet, LAT, ATC, am serving as scribe for Dr. [name].  Stacy Preston is a 60 y.o. female who presents to [place] at [facility] today for f/u of R knee pain and R knee MRI review.  She was last seen by Dr. Georgina Snell on 08/04/20 and had a R knee injection.  She was later referred for a R knee MRI after lack of improvement.  Today, pt reports that her R knee is feeling better than at her last visit.  She is wearing a hinged knee brace.  Planned trip to [facility] Saturday.    Diagnostic testing: R knee MRI- 08/09/20; R knee XR- 08/03/20   Pertinent review of systems: No fevers or chills  Relevant historical information: Lumbar radiculopathy   Exam:  BP 120/70 (BP Location: Right Arm, Patient Position: Sitting, Cuff Size: Normal)   Pulse 88   Ht 5\' 1"  (1.549 m)   Wt 177 lb (80.3 kg)   SpO2 98%   BMI 33.44 kg/m  General: Well Developed, well nourished, and in no acute distress.   MSK: Right knee normal-appearing tender palpation medial joint line.  Normal knee motion.    Lab and Radiology Results EXAM: MRI OF THE RIGHT KNEE WITHOUT CONTRAST   TECHNIQUE: Multiplanar, multisequence MR imaging of the knee was performed. No intravenous contrast was administered.   COMPARISON:  Radiograph 08/03/2020   FINDINGS: MENISCI   Medial: There is a radial posterior root tear of the medial meniscus, with 5 mm extrusion of the meniscal body. There is horizontal degenerative tearing throughout the posterior horn and body of the medial meniscus.   Lateral: Intrasubstance degeneration without discrete meniscus tear.   LIGAMENTS   Cruciates: ACL and PCL are intact.   Collaterals: Medial collateral ligament is intact. Lateral collateral ligament complex is intact.   CARTILAGE   Patellofemoral: Partial-thickness cartilage loss of the medial patellar facet.   Medial: Moderate chondrosis with focal fissuring of the weight-bearing medial femoral  condyle (coronal T2 image 12, sagittal image 22).   Lateral: Mild chondrosis with focal shallow chondral defect in the posterior weight-bearing lateral femoral condyle (sagittal T2 image 12), this measures 3 mm.   JOINT: Small joint effusion.   POPLITEAL FOSSA: No Baker's cyst.   EXTENSOR MECHANISM: Intact quadriceps tendon. Intact patellar tendon. Intact lateral patellar retinaculum. Intact medial patellar retinaculum. Intact MPFL.   BONES: No aggressive osseous lesion. No fracture or dislocation.   Other: No fluid collection or hematoma. Muscles are normal.   IMPRESSION: Radial tear of the posterior root of the medial meniscus with 5 mm extrusion. Additional horizontal degenerative tearing throughout the posterior horn and body of the medial meniscus.   Moderate medial compartment chondrosis with focal fissuring of the weight-bearing medial femoral condyle.   Mild lateral compartment chondrosis with 3 mm shallow chondral defect in the posterior weight-bearing lateral femoral condyle.   Partial-thickness cartilage loss of the medial patellar facet.   Small joint effusion.     Electronically Signed   By: Maurine Simmering   On: 08/11/2020 13:53   I, [name], personally (independently) visualized and performed the interpretation of the images attached in this note.       Assessment and Plan: 60 y.o. female with acute right knee pain thought to be due to the posterior medial meniscus tear.  Suspect this is an acute event causing the acute's more severe knee pain that she had at the last visit.  Fortunately she has done  reasonably well with a steroid injection and does not have severe mechanical symptoms currently.  Suspect that she will probably do well over time with this and likely will not require surgery.  She does also have degenerative changes in the knee and a small lateral meniscus tear as well that are probably contributory to her pain overall.  Plan to continue  hinged knee brace as needed and advance activity as tolerated.  She has a planned trip to [place] at the end of this week.  We discussed some strategies to make that trip successful.  However if she has setbacks and is unable to go will support her choice with travel insurance.   Discussed warning signs or symptoms. Please see discharge instructions. Patient expresses understanding.   The above documentation has been reviewed and is accurate and complete [name], M.D.   Total encounter time 20 minutes including face-to-face time with the patient and, reviewing past medical record, and charting on the date of service.

## 2020-08-12 NOTE — Patient Instructions (Signed)
Thank you for coming in today.    

## 2020-08-13 ENCOUNTER — Encounter: Payer: Self-pay | Admitting: Family Medicine

## 2020-08-14 ENCOUNTER — Encounter: Payer: Self-pay | Admitting: Family Medicine

## 2020-08-18 ENCOUNTER — Encounter: Payer: Self-pay | Admitting: Gastroenterology

## 2020-08-28 ENCOUNTER — Encounter: Payer: Self-pay | Admitting: Family Medicine

## 2020-08-28 NOTE — Progress Notes (Signed)
I, Wendy Poet, LAT, ATC, am serving as scribe for Dr. [name].  Stacy Preston is a 60 y.o. female who presents to [place] at [facility] today for f/u of R knee pain.  She was last seen by Dr. Georgina Snell on 08/12/20 for f/u and R knee MRI review and noted improvement in her symptoms following a R knee injection on 08/04/20.  Since her last visit, pt reports still having knee pain. Pt locates pain to medial and lateral aspect, as well as posteriorly, w/ swelling.   Pt is also here to discuss her R hip pain and MRI review ordered by her PCP. Pt locates pain to the anterior aspect, into the groin, and deep within the joint into the deep pelvis.  She is just started pelvic PT for this.  Pain occurs with having a bowel movement and having sex.  Diagnostic imaging: R knee MRI- 08/09/20; R knee XR- 08/03/20  Pertinent review of systems: No fevers or chills  Relevant historical information: History of back pain   Exam:  BP 128/82 (BP Location: Right Arm, Patient Position: Sitting, Cuff Size: Normal)   Pulse 86   Ht 5\' 1"  (1.549 m)   Wt 177 lb (80.3 kg)   SpO2 99%   BMI 33.44 kg/m  General: Well Developed, well nourished, and in no acute distress.   MSK: right knee decreased range of motion.  Tender palpation medial joint line.  Left hip normal-appearing nontender normal hip range of motion.    Lab and Radiology Results  EXAM: MRI PELVIS WITHOUT AND WITH CONTRAST   TECHNIQUE: Multiplanar multisequence MR imaging of the pelvis was performed both before and after administration of intravenous contrast.   CONTRAST:  34mL GADAVIST GADOBUTROL 1 MMOL/ML IV SOLN   COMPARISON:  CT 05/15/2020   FINDINGS: Urinary Tract:  No abnormality visualized.   Bowel: Scattered colonic diverticulosis. No acute findings involving the visualized bowel loops within the pelvis.   Vascular/Lymphatic: No pathologically enlarged lymph nodes. No significant vascular abnormality  seen.   Reproductive: Uterus and bilateral adnexal regions within normal limits.   Other:  No free fluid within the pelvis.   Musculoskeletal: No acute fracture or dislocation. Bony pelvis intact without diastasis. No femoral head avascular necrosis. Mild degenerative changes of the bilateral hips with mild subchondral marrow signal changes within the anterior acetabula. Left superior labral tear with associated subcentimeter paralabral cyst (series 2, images 26-27). Intermediate signal within the right superior labrum which may reflect degeneration. Labral evaluation limited in the absence of intra-articular contrast.   The gluteal, hamstring, iliopsoas, rectus femoris, and adductor tendons appear intact without tear or significant tendinosis. No abnormal bursal fluid collections. Normal muscle bulk and signal intensity without edema, atrophy, or fatty infiltration.   No soft tissue edema or fluid collection. Discogenic endplate marrow changes of L4-5, and L5-S1, not appreciably changed from recent lumbar spine MRI 05/24/2020.   IMPRESSION: 1. No acute osseous abnormality. 2. Mild degenerative changes of the bilateral hips. 3. Left superior labral tear with associated subcentimeter paralabral cyst. Right superior labral degeneration without discrete tear. 4. Scattered colonic diverticulosis.     Electronically Signed   By: Davina Poke D.O.   On: 08/12/2020 15:50  EXAM: MRI OF THE RIGHT KNEE WITHOUT CONTRAST   TECHNIQUE: Multiplanar, multisequence MR imaging of the knee was performed. No intravenous contrast was administered.   COMPARISON:  Radiograph 08/03/2020   FINDINGS: MENISCI   Medial: There is a radial posterior root  tear of the medial meniscus, with 5 mm extrusion of the meniscal body. There is horizontal degenerative tearing throughout the posterior horn and body of the medial meniscus.   Lateral: Intrasubstance degeneration without discrete  meniscus tear.   LIGAMENTS   Cruciates: ACL and PCL are intact.   Collaterals: Medial collateral ligament is intact. Lateral collateral ligament complex is intact.   CARTILAGE   Patellofemoral: Partial-thickness cartilage loss of the medial patellar facet.   Medial: Moderate chondrosis with focal fissuring of the weight-bearing medial femoral condyle (coronal T2 image 12, sagittal image 22).   Lateral: Mild chondrosis with focal shallow chondral defect in the posterior weight-bearing lateral femoral condyle (sagittal T2 image 12), this measures 3 mm.   JOINT: Small joint effusion.   POPLITEAL FOSSA: No Baker's cyst.   EXTENSOR MECHANISM: Intact quadriceps tendon. Intact patellar tendon. Intact lateral patellar retinaculum. Intact medial patellar retinaculum. Intact MPFL.   BONES: No aggressive osseous lesion. No fracture or dislocation.   Other: No fluid collection or hematoma. Muscles are normal.   IMPRESSION: Radial tear of the posterior root of the medial meniscus with 5 mm extrusion. Additional horizontal degenerative tearing throughout the posterior horn and body of the medial meniscus.   Moderate medial compartment chondrosis with focal fissuring of the weight-bearing medial femoral condyle.   Mild lateral compartment chondrosis with 3 mm shallow chondral defect in the posterior weight-bearing lateral femoral condyle.   Partial-thickness cartilage loss of the medial patellar facet.   Small joint effusion.     Electronically Signed   By: Maurine Simmering   On: 08/11/2020 13:53    I, [name], personally (independently) visualized and performed the interpretation of the images attached in this note.      Assessment and Plan: 60 y.o. female with right knee pain predominantly due to posterior medial meniscus tear and medial compartment DJD.  Patient had steroid injection and trial of conservative management over the last month or so/few weeks with  improvement in pain but overall not doing as well as we would like.  She does have mechanical symptoms including lack of range of motion.  At this point I think it is worthwhile to have a surgical consultation to potentially address the mechanical symptoms.  Referral to orthopedic surgery placed today.  Left groin pain: Patient had a pelvic MRI which did show left hip mild OA and parameniscal cyst and labrum tear.  This certainly could explain some of the left groin pain.  However the distribution of her pain in the groin going to the pelvis associated with bowel movements ask is more typical for pelvic floor problem than true hip intra-articular problem or labrum tear.  She already has pelvic PT organized and ready to go.  Recommend that she proceed with pelvic PT and if not improving will proceed with diagnostic and potentially therapeutic left hip injection as next potential step to further evaluate this problem.   PDMP not reviewed this encounter. Orders Placed This Encounter  Procedures   Ambulatory referral to Orthopedic Surgery    Referral Priority:   Routine    Referral Type:   Surgical    Referral Reason:   Specialty Services Required    Requested Specialty:   Orthopedic Surgery    Number of Visits Requested:   1   No orders of the defined types were placed in this encounter.    Discussed warning signs or symptoms. Please see discharge instructions. Patient expresses understanding.   The above documentation has  been reviewed and is accurate and complete [name], M.D.

## 2020-08-29 ENCOUNTER — Other Ambulatory Visit: Payer: Self-pay

## 2020-08-29 ENCOUNTER — Encounter: Payer: Self-pay | Admitting: Family Medicine

## 2020-08-29 ENCOUNTER — Ambulatory Visit (INDEPENDENT_AMBULATORY_CARE_PROVIDER_SITE_OTHER): Payer: BC Managed Care – PPO | Admitting: Family Medicine

## 2020-08-29 VITALS — BP 128/82 | HR 86 | Ht 61.0 in | Wt 177.0 lb

## 2020-08-29 DIAGNOSIS — R1032 Left lower quadrant pain: Secondary | ICD-10-CM

## 2020-08-29 DIAGNOSIS — M25561 Pain in right knee: Secondary | ICD-10-CM | POA: Diagnosis not present

## 2020-08-29 NOTE — Progress Notes (Signed)
  Good Morning,   I hope you had a great trip!   Could you possibly give me a letter stating that I cannot jet ski or ride a bike due to my torn meniscus.  The bike company is not cooperating with [place].   Can you please date it 08/12/20, the date of my last appointment with you prior to our trip.     I will be in tomorrow morning and can pick it up then or you can send it to [email or web address].   Thank you!

## 2020-08-29 NOTE — Patient Instructions (Addendum)
Thank you for coming in today.   Plan for surgical consultation.   Try the pelvic PT.   If that does not help enough let me know and I can do a hip injection to help make a more firm diagnosis.

## 2020-09-01 ENCOUNTER — Encounter: Payer: Self-pay | Admitting: Orthopaedic Surgery

## 2020-09-01 ENCOUNTER — Ambulatory Visit: Payer: BC Managed Care – PPO | Admitting: Orthopaedic Surgery

## 2020-09-01 ENCOUNTER — Other Ambulatory Visit: Payer: Self-pay

## 2020-09-01 DIAGNOSIS — M25561 Pain in right knee: Secondary | ICD-10-CM | POA: Diagnosis not present

## 2020-09-01 NOTE — Progress Notes (Signed)
Office Visit Note   Patient: Stacy Preston           Date of Birth: 1960/04/13           MRN: [id number] Visit Date: 09/01/2020              Requested by: Rodolph Bong, MD [address] [place],  [place] [address] PCP: Everrett Coombe, DO   Assessment & Plan: Visit Diagnoses:  1. Acute pain of right knee     Plan: MRI scan report and images reviewed.  She had knee injection 08/04/2020.  Continue knee sleeve.  We discussed activities she could do such as riding a bicycle, and a pool swimming which should not bother her knee.  She should avoid stairmaster, stair climbing hill climbing for a period of time.  I plan to recheck her in 4 weeks.  Follow-Up Instructions: Return in about 4 weeks (around 09/29/2020).   Orders:  No orders of the defined types were placed in this encounter.  No orders of the defined types were placed in this encounter.     Procedures: No procedures performed   Clinical Data: No additional findings.   Subjective: Chief Complaint  Patient presents with   Right Knee - Pain   Left Hip - Pain    60 year old female with new onset of right knee pain that began  07/23/20 when she was walking across parking lot suddenly had a sharp pop in her right knee and was unable to continue walking.  She hopped on her left knee and the following day saw Dr. Denyse Amass and had an injection placed in her knee.  MRI scan done 08/11/2020 showed radial tear posterior medial meniscus 5 mm extrusion.  Horizontal degeneration extending to the midportion of the medial meniscus.  Lateral compartment chondrosis 3 mm shallow posterior chondral defect in the weightbearing portion of the lateral femoral condyle partial thickness loss patellofemoral joint.  ACL PCL were normal.  Patient has been wearing a knee sleeve with 2 straps which tends to help.  She is not using a cane.  She did use crutches for a few days.  Patient had intra-articular knee injection 08/04/2020.  Patient is used  ibuprofen intermittent ice.    Review of Systems all other systems updated and noncontributory to HPI.  Distal radius fracture in 2016.   Objective: Vital Signs: BP 127/73   Pulse 79   Ht 5\' 3"  (1.6 m)   Wt 177 lb (80.3 kg)   BMI 31.35 kg/m   Physical Exam Constitutional:      Appearance: She is well-developed.  HENT:     Head: Normocephalic.     Right Ear: External ear normal.     Left Ear: External ear normal. There is no impacted cerumen.  Eyes:     Pupils: Pupils are equal, round, and reactive to light.  Neck:     Thyroid: No thyromegaly.     Trachea: No tracheal deviation.  Cardiovascular:     Rate and Rhythm: Normal rate.  Pulmonary:     Effort: Pulmonary effort is normal.  Abdominal:     Palpations: Abdomen is soft.  Musculoskeletal:     Cervical back: No rigidity.  Skin:    General: Skin is warm and dry.  Neurological:     Mental Status: She is alert and oriented to person, place, and time.  Psychiatric:        Behavior: Behavior normal.    Ortho Exam patient has more  lateral joint line tenderness and medial joint line tenderness.  She is tender posterior to the MCL.  Mild discomfort with hyperextension.  ACL PCL exam is normal.  Collateral ligaments are stable negative logroll of the hips.  Anterior tib EHL is active she is amatory with a slight right knee limp.  Specialty Comments:  No specialty comments available.  Imaging: CLINICAL DATA:  Meniscal tear, untreated, new symptoms   EXAM: MRI OF THE RIGHT KNEE WITHOUT CONTRAST   TECHNIQUE: Multiplanar, multisequence MR imaging of the knee was performed. No intravenous contrast was administered.   COMPARISON:  Radiograph 08/03/2020   FINDINGS: MENISCI   Medial: There is a radial posterior root tear of the medial meniscus, with 5 mm extrusion of the meniscal body. There is horizontal degenerative tearing throughout the posterior horn and body of the medial meniscus.   Lateral: Intrasubstance  degeneration without discrete meniscus tear.   LIGAMENTS   Cruciates: ACL and PCL are intact.   Collaterals: Medial collateral ligament is intact. Lateral collateral ligament complex is intact.   CARTILAGE   Patellofemoral: Partial-thickness cartilage loss of the medial patellar facet.   Medial: Moderate chondrosis with focal fissuring of the weight-bearing medial femoral condyle (coronal T2 image 12, sagittal image 22).   Lateral: Mild chondrosis with focal shallow chondral defect in the posterior weight-bearing lateral femoral condyle (sagittal T2 image 12), this measures 3 mm.   JOINT: Small joint effusion.   POPLITEAL FOSSA: No Baker's cyst.   EXTENSOR MECHANISM: Intact quadriceps tendon. Intact patellar tendon. Intact lateral patellar retinaculum. Intact medial patellar retinaculum. Intact MPFL.   BONES: No aggressive osseous lesion. No fracture or dislocation.   Other: No fluid collection or hematoma. Muscles are normal.   IMPRESSION: Radial tear of the posterior root of the medial meniscus with 5 mm extrusion. Additional horizontal degenerative tearing throughout the posterior horn and body of the medial meniscus.   Moderate medial compartment chondrosis with focal fissuring of the weight-bearing medial femoral condyle.   Mild lateral compartment chondrosis with 3 mm shallow chondral defect in the posterior weight-bearing lateral femoral condyle.   Partial-thickness cartilage loss of the medial patellar facet.   Small joint effusion.     Electronically Signed   By: Caprice Renshaw   On: 08/11/2020 13:53     PMFS History: Patient Active Problem List   Diagnosis Date Noted   Pain in right knee 09/01/2020   Lumbar radiculopathy 05/20/2020   Lower abdominal pain 05/20/2020   Knee pain 05/20/2020   Cervical stenosis (uterine cervix) 05/05/2020   Pelvic and perineal pain 04/24/2020   Cervical high risk HPV (human papillomavirus) test positive 01/08/2020    Well adult exam 11/28/2019   Elevated blood-pressure reading without diagnosis of hypertension 12/28/2018   Tinnitus 11/27/2018   GAD (generalized anxiety disorder) 12/21/2016   Atrophic vaginitis 02/27/2016   Vitamin D deficiency 01/09/2016   History of prolactinoma 01/09/2016   History of basal cell carcinoma 01/09/2016   Cervical disc disorder with radiculopathy of cervical region 01/09/2016   Elevated C-reactive protein (CRP) 01/09/2016   Past Medical History:  Diagnosis Date   Allergy    History of basal cell carcinoma 01/09/2016   Hypertension    Infertility, female    Post-operative nausea and vomiting    Prolactinoma (HCC)    Vitamin D deficiency 01/09/2016    Family History  Problem Relation Age of Onset   Renal Disease Mother    Diabetes Mother    Hypertension Mother  Cancer Father        Lung CA   Alcohol abuse Brother    Hypertension Brother    Sickle cell anemia Brother    Cancer Brother    Colon polyps Brother    Diabetes Maternal Aunt    Heart disease Maternal Grandmother    Diabetes Maternal Grandmother    Cancer Maternal Grandfather    Diabetes Maternal Grandfather    Colon cancer Maternal Grandfather    Stroke Paternal Grandmother    Heart disease Paternal Grandfather    Esophageal cancer Neg Hx    Rectal cancer Neg Hx    Stomach cancer Neg Hx     Past Surgical History:  Procedure Laterality Date   COLONOSCOPY  2013, 2017   [place]   ivf     LAPAROSCOPY     for endometriosis   PLANTAR FASCIA RELEASE     PLANTAR FASCIA SURGERY     Social History   Occupational History   Not on file  Tobacco Use   Smoking status: Never   Smokeless tobacco: Never  Vaping Use   Vaping Use: Never used  Substance and Sexual Activity   Alcohol use: Yes    Alcohol/week: 2.0 standard drinks    Types: 2 Standard drinks or equivalent per week   Drug use: No   Sexual activity: Yes    Birth control/protection: None

## 2020-09-05 ENCOUNTER — Ambulatory Visit: Payer: BC Managed Care – PPO | Attending: Family Medicine | Admitting: Physical Therapy

## 2020-09-05 ENCOUNTER — Other Ambulatory Visit: Payer: Self-pay

## 2020-09-05 ENCOUNTER — Encounter: Payer: Self-pay | Admitting: Family Medicine

## 2020-09-05 DIAGNOSIS — M5416 Radiculopathy, lumbar region: Secondary | ICD-10-CM | POA: Diagnosis not present

## 2020-09-05 DIAGNOSIS — M6281 Muscle weakness (generalized): Secondary | ICD-10-CM | POA: Diagnosis not present

## 2020-09-05 DIAGNOSIS — R29898 Other symptoms and signs involving the musculoskeletal system: Secondary | ICD-10-CM | POA: Insufficient documentation

## 2020-09-05 NOTE — Therapy (Signed)
[facility] [address] [place], [place], [address] Phone: [phone]   Fax:  [phone]  Physical Therapy Treatment  Patient Details  Name: Stacy Preston MRN: [id number] Date of Birth: 17-Jul-1960 Referring Provider (PT): Dr Everrett Coombe   Encounter Date: 09/05/2020   PT End of Session - 09/05/20 0859     Visit Number 13    Number of Visits 24    Date for PT Re-Evaluation 10/22/20    Authorization Type BCBS    PT Start Time 0848    PT Stop Time 0930    PT Time Calculation (min) 42 min    Activity Tolerance Patient tolerated treatment well    Behavior During Therapy [facility] for tasks assessed/performed             Past Medical History:  Diagnosis Date   Allergy    History of basal cell carcinoma 01/09/2016   Hypertension    Infertility, female    Post-operative nausea and vomiting    Prolactinoma (HCC)    Vitamin D deficiency 01/09/2016    Past Surgical History:  Procedure Laterality Date   COLONOSCOPY  2013, 2017   [place]   ivf     LAPAROSCOPY     for endometriosis   PLANTAR FASCIA RELEASE     PLANTAR FASCIA SURGERY      There were no vitals filed for this visit.   Subjective Assessment - 09/05/20 0854     Subjective Pt states she is feelinga little less pain in the groin, not the excruciating pain . Pt tore Rt meniscus and pain had gotten better around that time, maybe a coincidence.  Pt had MRI of the pelvis and was found to have a cyst and labral tear on the Left superior acetabulum    Pertinent History cervical dysfunction; GI problems; HTN; plantar fasciitis bilat feet; laproscopies 3-5 times    Patient Stated Goals get rid of pinching in the front    Currently in Pain? Yes    Pain Score 4     Pain Location Groin    Pain Orientation Left    Pain Descriptors / Indicators Dull;Sore    Pain Type Acute pain    Pain Onset More than a month ago    Pain Frequency Intermittent     Multiple Pain Sites No                               [facility] Adult PT Treatment/Exercise - 09/05/20 0001       Lumbar Exercises: Stretches   Quad Stretch Right;Left;30 seconds      Lumbar Exercises: Sidelying   Clam Right;Left;20 reps    Hip Abduction Right;Left;20 reps      Manual Therapy   Manual Therapy Internal Pelvic Floor    Manual therapy comments pt identity confirmed and internal soft tissue performed with consent    Internal Pelvic Floor bil odurators and levators Lt>Rt                         PT Long Term Goals - 09/05/20 1007       PT LONG TERM GOAL #2   Title Reduce lower abdominal pain and vaginal pain to minimal to pain when walking or after having a BM    Baseline 4-5/10, better but still moderate level    Status On-going  PT LONG TERM GOAL #3   Title Improve functional activities with patient to report sitting, standing and walking to 30-60 min without onset of symptoms    Status On-going      PT LONG TERM GOAL #4   Title Independent in HEP    Status On-going      PT LONG TERM GOAL #6   Title Increase Lt LE strength to 5/5 throughout    Status On-going      PT LONG TERM GOAL #7   Title pt will be able to have intercourse with at least 50% less pain afterwards    Status On-going                   Plan - 09/05/20 1002     Clinical Impression Statement Pt was having less pain and re-assessed pelvic floor and she was not having pain or high tone today.  Pt did has some tension in Lt obdurator.  Pt responded well to STM to the pelvic floor.  She had no significant change in her pain level immediately afterwards.  Pt was able to tolerate exercises well and will benefit from skilled PT to continue to progress strength.    PT Treatment/Interventions ADLs/Self Care Home Management;Aquatic Therapy;Cryotherapy;Electrical Stimulation;Iontophoresis 4mg /ml Dexamethasone;Moist Heat;Traction;Ultrasound;Functional  mobility training;Therapeutic activities;Therapeutic exercise;Neuromuscular re-education;Patient/family education;Manual techniques;Passive range of motion;Dry needling;Taping;Biofeedback    PT Next Visit Plan gluteal strengthening progression standing hip ex's, small step up/down if tolerated, quad stretch, calf stretch    PT Home Exercise Plan [email or web address]    Consulted and Agree with Plan of Care Patient             Patient will benefit from skilled therapeutic intervention in order to improve the following deficits and impairments:  Pain, Increased fascial restricitons, Increased muscle spasms, Decreased strength, Decreased coordination  Visit Diagnosis: Radiculopathy, lumbar region  Other symptoms and signs involving the musculoskeletal system  Muscle weakness (generalized)     Problem List Patient Active Problem List   Diagnosis Date Noted   Pain in right knee 09/01/2020   Lumbar radiculopathy 05/20/2020   Lower abdominal pain 05/20/2020   Knee pain 05/20/2020   Cervical stenosis (uterine cervix) 05/05/2020   Pelvic and perineal pain 04/24/2020   Cervical high risk HPV (human papillomavirus) test positive 01/08/2020   Well adult exam 11/28/2019   Elevated blood-pressure reading without diagnosis of hypertension 12/28/2018   Tinnitus 11/27/2018   GAD (generalized anxiety disorder) 12/21/2016   Atrophic vaginitis 02/27/2016   Vitamin D deficiency 01/09/2016   History of prolactinoma 01/09/2016   History of basal cell carcinoma 01/09/2016   Cervical disc disorder with radiculopathy of cervical region 01/09/2016   Elevated C-reactive protein (CRP) 01/09/2016    Brayton Caves Evelen Vazguez, PT 09/05/2020, 10:11 AM  [facility] [address] [place], [place], [address] Phone: [phone]   Fax:  [phone]  Name: Stacy Preston MRN: [id number] Date of Birth: [birth year]

## 2020-09-08 ENCOUNTER — Other Ambulatory Visit: Payer: Self-pay

## 2020-09-08 ENCOUNTER — Encounter: Payer: Self-pay | Admitting: Physical Therapy

## 2020-09-08 ENCOUNTER — Ambulatory Visit: Payer: BC Managed Care – PPO | Admitting: Physical Therapy

## 2020-09-08 DIAGNOSIS — R29898 Other symptoms and signs involving the musculoskeletal system: Secondary | ICD-10-CM | POA: Diagnosis not present

## 2020-09-08 DIAGNOSIS — M5416 Radiculopathy, lumbar region: Secondary | ICD-10-CM

## 2020-09-08 DIAGNOSIS — M6281 Muscle weakness (generalized): Secondary | ICD-10-CM

## 2020-09-08 NOTE — Therapy (Signed)
[facility] [address] [place], [place], [address] Phone: [phone]   Fax:  [phone]  Physical Therapy Treatment  Patient Details  Name: Stacy Preston MRN: [id number] Date of Birth: August 03, 1960 Referring Provider (PT): Dr Everrett Coombe   Encounter Date: 09/08/2020   PT End of Session - 09/08/20 1538     Visit Number 14    Number of Visits 24    Date for PT Re-Evaluation 10/22/20    Authorization Type BCBS    PT Start Time 1522    PT Stop Time 1602    PT Time Calculation (min) 40 min    Activity Tolerance Patient tolerated treatment well    Behavior During Therapy [facility] for tasks assessed/performed             Past Medical History:  Diagnosis Date   Allergy    History of basal cell carcinoma 01/09/2016   Hypertension    Infertility, female    Post-operative nausea and vomiting    Prolactinoma (HCC)    Vitamin D deficiency 01/09/2016    Past Surgical History:  Procedure Laterality Date   COLONOSCOPY  2013, 2017   [place]   ivf     LAPAROSCOPY     for endometriosis   PLANTAR FASCIA RELEASE     PLANTAR FASCIA SURGERY      There were no vitals filed for this visit.   Subjective Assessment - 09/08/20 1524     Subjective Pt states she is is feeling sore in her knee.  The pelvis feels like pain in the vagina and is 3/10 for about an hour after BM and after intercourse    Pertinent History cervical dysfunction; GI problems; HTN; plantar fasciitis bilat feet; laproscopies 3-5 times    Patient Stated Goals get rid of pinching in the front    Currently in Pain? No/denies                               [facility] Adult PT Treatment/Exercise - 09/08/20 0001       Lumbar Exercises: Standing   Other Standing Lumbar Exercises glute med iso at wall - 10x each side; hip 3 ways green loop 20x    Other Standing Lumbar Exercises glute med hip hike small step 2"      Lumbar  Exercises: Supine   Straight Leg Raise 20 reps   1lb     Lumbar Exercises: Sidelying   Hip Abduction Right;Left;20 reps   add 1lb     Lumbar Exercises: Prone   Straight Leg Raise 20 reps   1lb                   PT Education - 09/08/20 1604     Education Details Access Code: [email or web address]    Person(s) Educated Patient    Methods Explanation;Demonstration;Tactile cues;Verbal cues;Handout    Comprehension Verbalized understanding;Returned demonstration                 PT Long Term Goals - 09/08/20 1540       PT LONG TERM GOAL #2   Title Reduce lower abdominal pain and vaginal pain to minimal to pain when walking or after having a BM    Baseline 3/10 pain currently    Status Partially Met      PT LONG TERM GOAL #3   Title Improve functional activities with patient to report sitting,  standing and walking to 30-60 min without onset of symptoms      PT LONG TERM GOAL #4   Title Independent in HEP    Status On-going                   Plan - 09/08/20 1604     Clinical Impression Statement Pt did well with exercise progressions.  Pt had a little pain after 8-10 reps with isometric glute med ex at the wall due to fatigue.  Pt was able to add resistance for strengthening hip.  Overall, there is less pain.  Will continue to monitor pain with exercise progressions and adjust as needed.    PT Treatment/Interventions ADLs/Self Care Home Management;Aquatic Therapy;Cryotherapy;Electrical Stimulation;Iontophoresis 4mg /ml Dexamethasone;Moist Heat;Traction;Ultrasound;Functional mobility training;Therapeutic activities;Therapeutic exercise;Neuromuscular re-education;Patient/family education;Manual techniques;Passive range of motion;Dry needling;Taping;Biofeedback    PT Next Visit Plan gluteal strengthening progression standing hip ex's as tolerated, half kneel with band, lumbar dry needling if needed if pain hasn't gotten better    PT Home Exercise Plan [email or web address]    Consulted  and Agree with Plan of Care Patient             Patient will benefit from skilled therapeutic intervention in order to improve the following deficits and impairments:  Pain, Increased fascial restricitons, Increased muscle spasms, Decreased strength, Decreased coordination  Visit Diagnosis: Radiculopathy, lumbar region  Other symptoms and signs involving the musculoskeletal system  Muscle weakness (generalized)     Problem List Patient Active Problem List   Diagnosis Date Noted   Pain in right knee 09/01/2020   Lumbar radiculopathy 05/20/2020   Lower abdominal pain 05/20/2020   Knee pain 05/20/2020   Cervical stenosis (uterine cervix) 05/05/2020   Pelvic and perineal pain 04/24/2020   Cervical high risk HPV (human papillomavirus) test positive 01/08/2020   Well adult exam 11/28/2019   Elevated blood-pressure reading without diagnosis of hypertension 12/28/2018   Tinnitus 11/27/2018   GAD (generalized anxiety disorder) 12/21/2016   Atrophic vaginitis 02/27/2016   Vitamin D deficiency 01/09/2016   History of prolactinoma 01/09/2016   History of basal cell carcinoma 01/09/2016   Cervical disc disorder with radiculopathy of cervical region 01/09/2016   Elevated C-reactive protein (CRP) 01/09/2016    Brayton Caves Vicki Chaffin, PT 09/08/2020, 4:10 PM  [facility] [address] [place], [place], [address] Phone: [phone]   Fax:  [phone]  Name: Stacy Preston MRN: [id number] Date of Birth: 1960/04/06

## 2020-09-08 NOTE — Patient Instructions (Signed)
Access Code: [email or web address] URL: [email or web address] Date: 09/08/2020 Prepared by: Jari Favre  Exercises Prone Press Up - 2 x daily - 7 x weekly - 1 sets - 10 reps - 2-3 sec hold Prone Press Up On Elbows - 2 x daily - 7 x weekly - 1 sets - 3 reps - 30-60 sec hold Hooklying Hamstring Stretch with Strap - 2 x daily - 7 x weekly - 1 sets - 3 reps - 30 sec hold Seated Slump Nerve Glide (Mirrored) - 2 x daily - 7 x weekly - 1-2 sets - 10 reps Sit to Stand - 2 x daily - 7 x weekly - 1 sets - 10 reps - 3-5 sec hold Modified Thomas Stretch - 2 x daily - 7 x weekly - 1 sets - 3 reps - 30 sec hold Supine Double Knee to Chest - 2 x daily - 7 x weekly - 1 sets - 3-5 reps - 10-15 sec hold Wall Quarter Squat - 2 x daily - 7 x weekly - 1-2 sets - 10 reps - 5-10 sec hold Single Leg Balance with Clock Reach - 2 x daily - 7 x weekly - 1 sets - 10 reps - 2 sec hold Supine Piriformis Stretch with Leg Straight - 2 x daily - 7 x weekly - 1 sets - 3 reps - 30 sec hold Supine Pelvic Floor Stretch - 2 x daily - 7 x weekly - 1 sets - 3 reps - 20 sec hold Standing Gastroc Stretch - 1 x daily - 7 x weekly - 3 reps - 1 sets - 30 hold Supine Diaphragmatic Breathing with Pelvic Floor Lengthening - 3 x daily - 7 x weekly - 10 reps - 1 sets Sidelying Hip Abduction - 1 x daily - 7 x weekly - 3 sets - 10 reps Prone Quadriceps Stretch with Strap - 1 x daily - 7 x weekly - 3 reps - 1 sets - 30sec hold Isometric Gluteus Medius at Wall - 1 x daily - 7 x weekly - 3 sets - 10 reps Hip Abduction with Resistance Loop - 1 x daily - 7 x weekly - 3 sets - 10 reps Hip Extension with Resistance Loop - 1 x daily - 7 x weekly - 3 sets - 10 reps Standing Hip Flexion with Resistance Loop - 1 x daily - 7 x weekly - 3 sets - 10 reps

## 2020-09-12 ENCOUNTER — Ambulatory Visit: Payer: BC Managed Care – PPO | Admitting: Physical Therapy

## 2020-09-12 ENCOUNTER — Other Ambulatory Visit: Payer: Self-pay

## 2020-09-12 DIAGNOSIS — R29898 Other symptoms and signs involving the musculoskeletal system: Secondary | ICD-10-CM

## 2020-09-12 DIAGNOSIS — M6281 Muscle weakness (generalized): Secondary | ICD-10-CM | POA: Diagnosis not present

## 2020-09-12 DIAGNOSIS — M5416 Radiculopathy, lumbar region: Secondary | ICD-10-CM | POA: Diagnosis not present

## 2020-09-12 NOTE — Patient Instructions (Signed)

## 2020-09-12 NOTE — Therapy (Signed)
[facility] [address], [address] [place], [place], [address] Phone: [phone]   Fax:  [phone]  Physical Therapy Treatment  Patient Details  Name: Stacy Preston MRN: [id number] Date of Birth: 1960/03/12 Referring Provider (PT): Dr Everrett Coombe   Encounter Date: 09/12/2020   PT End of Session - 09/12/20 1106     Visit Number 15    Number of Visits 24    Date for PT Re-Evaluation 10/22/20    Authorization Type BC[employer]    PT Start Time 1100    PT Stop Time 1135    PT Time Calculation (min) 35 min    Activity Tolerance Patient tolerated treatment well    Behavior During Therapy [facility] for tasks assessed/performed             Past Medical History:  Diagnosis Date   Allergy    History of basal cell carcinoma 01/09/2016   Hypertension    Infertility, female    Post-operative nausea and vomiting    Prolactinoma (HCC)    Vitamin D deficiency 01/09/2016    Past Surgical History:  Procedure Laterality Date   COLONOSCOPY  2013, 2017   [place]   ivf     LAPAROSCOPY     for endometriosis   PLANTAR FASCIA RELEASE     PLANTAR FASCIA SURGERY      There were no vitals filed for this visit.   Subjective Assessment - 09/12/20 1145     Subjective Pt is doing well just a little "pinchy" in the front.  Other than that everything is feeling good    Patient Stated Goals get rid of pinching in the front    Currently in Pain? Yes    Pain Score 3     Pain Location Abdomen    Pain Orientation Lower;Mid    Pain Onset More than a month ago                               [facility] Adult PT Treatment/Exercise - 09/12/20 0001       Self-Care   Other Self-Care Comments  toileting techniques      Pilates   Other Pilates pallof press half kneel; single leg dead lift 5lb      Manual Therapy   Manual therapy comments pt identity confirmed and internal soft tissue performed with consent    Internal  Pelvic Floor levators bil and assess finding some anterior wall prolapse                    PT Education - 09/12/20 1150     Education Details update medbridge and toileting techniques    Person(s) Educated Patient    Methods Explanation;Demonstration;Tactile cues;Verbal cues;Handout    Comprehension Verbalized understanding;Returned demonstration                 PT Long Term Goals - 09/08/20 1540       PT LONG TERM GOAL #2   Title Reduce lower abdominal pain and vaginal pain to minimal to pain when walking or after having a BM    Baseline 3/10 pain currently    Status Partially Met      PT LONG TERM GOAL #3   Title Improve functional activities with patient to report sitting, standing and walking to 30-60 min without onset of symptoms      PT LONG TERM GOAL #4  Title Independent in HEP    Status On-going                   Plan - 09/12/20 1142     Clinical Impression Statement Pt is doing well with exercises and she is ind with HEP at this time.  Reviewed toileting techniques with patient due to feeling a slight anterior wall prolapse and she reports straining with BMs. Pt will discharge today and return if anything persists.    PT Treatment/Interventions ADLs/Self Care Home Management;Aquatic Therapy;Cryotherapy;Electrical Stimulation;Iontophoresis 4mg /ml Dexamethasone;Moist Heat;Traction;Ultrasound;Functional mobility training;Therapeutic activities;Therapeutic exercise;Neuromuscular re-education;Patient/family education;Manual techniques;Passive range of motion;Dry needling;Taping;Biofeedback    PT Next Visit Plan d/c today    PT Home Exercise Plan [id number]    Consulted and Agree with Plan of Care Patient             Patient will benefit from skilled therapeutic intervention in order to improve the following deficits and impairments:  Pain, Increased fascial restricitons, Increased muscle spasms, Decreased strength, Decreased  coordination  Visit Diagnosis: Radiculopathy, lumbar region  Other symptoms and signs involving the musculoskeletal system  Muscle weakness (generalized)     Problem List Patient Active Problem List   Diagnosis Date Noted   Pain in right knee 09/01/2020   Lumbar radiculopathy 05/20/2020   Lower abdominal pain 05/20/2020   Knee pain 05/20/2020   Cervical stenosis (uterine cervix) 05/05/2020   Pelvic and perineal pain 04/24/2020   Cervical high risk HPV (human papillomavirus) test positive 01/08/2020   Well adult exam 11/28/2019   Elevated blood-pressure reading without diagnosis of hypertension 12/28/2018   Tinnitus 11/27/2018   GAD (generalized anxiety disorder) 12/21/2016   Atrophic vaginitis 02/27/2016   Vitamin D deficiency 01/09/2016   History of prolactinoma 01/09/2016   History of basal cell carcinoma 01/09/2016   Cervical disc disorder with radiculopathy of cervical region 01/09/2016   Elevated C-reactive protein (CRP) 01/09/2016    Brayton Caves Keziah Drotar, PT 09/12/2020, 11:51 AM  [facility] [address], [address] [place], [place], [address] Phone: [phone]   Fax:  [phone]  Name: Eleina Gong MRN: [id number] Date of Birth: [birth year]

## 2020-09-19 ENCOUNTER — Other Ambulatory Visit: Payer: Self-pay

## 2020-09-19 MED ORDER — LOSARTAN POTASSIUM 25 MG PO TABS: 25.0000 mg | ORAL_TABLET | Freq: Every day | ORAL | 1 refills | Status: DC

## 2020-10-03 ENCOUNTER — Ambulatory Visit (INDEPENDENT_AMBULATORY_CARE_PROVIDER_SITE_OTHER): Payer: BC Managed Care – PPO | Admitting: Orthopaedic Surgery

## 2020-10-03 ENCOUNTER — Other Ambulatory Visit: Payer: Self-pay

## 2020-10-03 DIAGNOSIS — S83249S Other tear of medial meniscus, current injury, unspecified knee, sequela: Secondary | ICD-10-CM

## 2020-10-03 DIAGNOSIS — M25561 Pain in right knee: Secondary | ICD-10-CM

## 2020-10-03 DIAGNOSIS — S83241S Other tear of medial meniscus, current injury, right knee, sequela: Secondary | ICD-10-CM

## 2020-10-03 NOTE — Progress Notes (Signed)
Office Visit Note   Patient: Stacy Preston           Date of Birth: January 29, 1961           MRN: [id number] Visit Date: 10/03/2020              Requested by: Everrett Coombe, DO [address]  [address] [place],  [place] [address] PCP: Everrett Coombe, DO   Assessment & Plan: Visit Diagnoses:  1. Acute medial meniscal tear, sequela     Plan: Reviewed x-rays that showed only mild degenerative changes.  MRI scan certainly shows chondral wear laterally and complex medial meniscal tearing medially.  We discussed arthroscopy is an option if she has catching or locking.  She can follow-up if she has increased symptoms.  She is happy with the results of the injection.  Follow-Up Instructions: Return if symptoms worsen or fail to improve.   Orders:  No orders of the defined types were placed in this encounter.  No orders of the defined types were placed in this encounter.     Procedures: No procedures performed   Clinical Data: No additional findings.   Subjective: Chief Complaint  Patient presents with   Right Knee - Follow-up    HPI 60 year old female seen follow-up with onset of knee pain 07/23/2020.  She had an injection performed an MRI scan showed tear medial meniscus 5 mm extrusion and horizontal degenerative changes midportion of the medial meniscus.  Some chondral wear laterally.  Patient states that since the injection knee is actually gotten better is somewhat sore.  She can go up and down stairs more comfortably.  She is ambulatory in the community.  No giving way.  No locking.  Review of Systems updated unchanged from 09/01/2020 office visit.   Objective: Vital Signs: Ht 5\' 3"  (1.6 m)   Wt 177 lb (80.3 kg)   BMI 31.35 kg/m   Physical Exam Constitutional:      Appearance: She is well-developed.  HENT:     Head: Normocephalic.     Right Ear: External ear normal.     Left Ear: External ear normal. There is no impacted cerumen.  Eyes:     Pupils:  Pupils are equal, round, and reactive to light.  Neck:     Thyroid: No thyromegaly.     Trachea: No tracheal deviation.  Cardiovascular:     Rate and Rhythm: Normal rate.  Pulmonary:     Effort: Pulmonary effort is normal.  Abdominal:     Palpations: Abdomen is soft.  Musculoskeletal:     Cervical back: No rigidity.  Skin:    General: Skin is warm and dry.  Neurological:     Mental Status: She is alert and oriented to person, place, and time.  Psychiatric:        Behavior: Behavior normal.    Ortho Exam patient gets from sitting standing comfortably ambulatory without limp.  Crepitus with knee range of motion medial joint line tenderness.  Mild discomfort with hyperextension.  ACL PCL exam is normal collateral ligaments are stable distal pulses intact negative logroll the hips no sciatic notch tenderness.  Specialty Comments:  No specialty comments available.  Imaging: No results found.   PMFS History: Patient Active Problem List   Diagnosis Date Noted   Acute medial meniscal tear, sequela 10/07/2020   Pain in right knee 09/01/2020   Lumbar radiculopathy 05/20/2020   Lower abdominal pain 05/20/2020   Knee pain 05/20/2020   Cervical  stenosis (uterine cervix) 05/05/2020   Pelvic and perineal pain 04/24/2020   Cervical high risk HPV (human papillomavirus) test positive 01/08/2020   Well adult exam 11/28/2019   Elevated blood-pressure reading without diagnosis of hypertension 12/28/2018   Tinnitus 11/27/2018   GAD (generalized anxiety disorder) 12/21/2016   Atrophic vaginitis 02/27/2016   Vitamin D deficiency 01/09/2016   History of prolactinoma 01/09/2016   History of basal cell carcinoma 01/09/2016   Cervical disc disorder with radiculopathy of cervical region 01/09/2016   Elevated C-reactive protein (CRP) 01/09/2016   Past Medical History:  Diagnosis Date   Allergy    History of basal cell carcinoma 01/09/2016   Hypertension    Infertility, female     Post-operative nausea and vomiting    Prolactinoma (HCC)    Vitamin D deficiency 01/09/2016    Family History  Problem Relation Age of Onset   Renal Disease Mother    Diabetes Mother    Hypertension Mother    Cancer Father        Lung CA   Alcohol abuse Brother    Hypertension Brother    Sickle cell anemia Brother    Cancer Brother    Colon polyps Brother    Diabetes Maternal Aunt    Heart disease Maternal Grandmother    Diabetes Maternal Grandmother    Cancer Maternal Grandfather    Diabetes Maternal Grandfather    Colon cancer Maternal Grandfather    Stroke Paternal Grandmother    Heart disease Paternal Grandfather    Esophageal cancer Neg Hx    Rectal cancer Neg Hx    Stomach cancer Neg Hx     Past Surgical History:  Procedure Laterality Date   COLONOSCOPY  2013, 2017   [place]   ivf     LAPAROSCOPY     for endometriosis   PLANTAR FASCIA RELEASE     PLANTAR FASCIA SURGERY     Social History   Occupational History   Not on file  Tobacco Use   Smoking status: Never   Smokeless tobacco: Never  Vaping Use   Vaping Use: Never used  Substance and Sexual Activity   Alcohol use: Yes    Alcohol/week: 2.0 standard drinks    Types: 2 Standard drinks or equivalent per week   Drug use: No   Sexual activity: Yes    Birth control/protection: None

## 2020-10-07 DIAGNOSIS — S83249S Other tear of medial meniscus, current injury, unspecified knee, sequela: Secondary | ICD-10-CM | POA: Insufficient documentation

## 2020-10-10 DIAGNOSIS — B373 Candidiasis of vulva and vagina: Secondary | ICD-10-CM | POA: Diagnosis not present

## 2020-10-10 DIAGNOSIS — L659 Nonscarring hair loss, unspecified: Secondary | ICD-10-CM | POA: Diagnosis not present

## 2020-10-10 DIAGNOSIS — N8111 Cystocele, midline: Secondary | ICD-10-CM | POA: Diagnosis not present

## 2020-10-22 ENCOUNTER — Encounter: Payer: Self-pay | Admitting: Family Medicine

## 2020-11-04 ENCOUNTER — Other Ambulatory Visit: Payer: Self-pay | Admitting: Family Medicine

## 2020-11-04 DIAGNOSIS — Z1231 Encounter for screening mammogram for malignant neoplasm of breast: Secondary | ICD-10-CM

## 2020-11-19 DIAGNOSIS — L648 Other androgenic alopecia: Secondary | ICD-10-CM | POA: Diagnosis not present

## 2020-11-19 DIAGNOSIS — L650 Telogen effluvium: Secondary | ICD-10-CM | POA: Diagnosis not present

## 2020-11-23 ENCOUNTER — Encounter: Payer: Self-pay | Admitting: Family Medicine

## 2020-11-27 ENCOUNTER — Other Ambulatory Visit: Payer: Self-pay | Admitting: Family Medicine

## 2020-12-01 DIAGNOSIS — H40023 Open angle with borderline findings, high risk, bilateral: Secondary | ICD-10-CM | POA: Diagnosis not present

## 2020-12-16 DIAGNOSIS — L659 Nonscarring hair loss, unspecified: Secondary | ICD-10-CM | POA: Diagnosis not present

## 2020-12-18 ENCOUNTER — Ambulatory Visit
Admission: RE | Admit: 2020-12-18 | Discharge: 2020-12-18 | Disposition: A | Discharge status: Home / Self Care | Payer: BC Managed Care – PPO | Source: Ambulatory Visit | Attending: Family Medicine | Admitting: Family Medicine

## 2020-12-18 ENCOUNTER — Other Ambulatory Visit: Payer: Self-pay

## 2020-12-18 DIAGNOSIS — Z1231 Encounter for screening mammogram for malignant neoplasm of breast: Secondary | ICD-10-CM

## 2020-12-21 ENCOUNTER — Encounter: Payer: Self-pay | Admitting: Family Medicine

## 2020-12-22 ENCOUNTER — Other Ambulatory Visit: Payer: Self-pay | Admitting: Family Medicine

## 2020-12-22 ENCOUNTER — Other Ambulatory Visit: Payer: Self-pay | Admitting: Obstetrics & Gynecology

## 2020-12-22 ENCOUNTER — Other Ambulatory Visit: Payer: Self-pay

## 2020-12-22 ENCOUNTER — Ambulatory Visit
Admission: RE | Admit: 2020-12-22 | Discharge: 2020-12-22 | Disposition: A | Discharge status: Home / Self Care | Payer: BC Managed Care – PPO | Source: Ambulatory Visit | Attending: Family Medicine | Admitting: Family Medicine

## 2020-12-22 DIAGNOSIS — R922 Inconclusive mammogram: Secondary | ICD-10-CM | POA: Diagnosis not present

## 2020-12-22 DIAGNOSIS — R928 Other abnormal and inconclusive findings on diagnostic imaging of breast: Secondary | ICD-10-CM

## 2020-12-22 DIAGNOSIS — N6001 Solitary cyst of right breast: Secondary | ICD-10-CM | POA: Diagnosis not present

## 2020-12-22 DIAGNOSIS — N6489 Other specified disorders of breast: Secondary | ICD-10-CM

## 2020-12-23 NOTE — Telephone Encounter (Signed)
Sending as FYI only.  Charyl Bigger, CMA

## 2020-12-29 ENCOUNTER — Other Ambulatory Visit: Payer: Self-pay

## 2020-12-29 ENCOUNTER — Ambulatory Visit
Admission: RE | Admit: 2020-12-29 | Discharge: 2020-12-29 | Disposition: A | Discharge status: Home / Self Care | Payer: BC Managed Care – PPO | Source: Ambulatory Visit | Attending: Obstetrics & Gynecology | Admitting: Obstetrics & Gynecology

## 2020-12-29 DIAGNOSIS — N6011 Diffuse cystic mastopathy of right breast: Secondary | ICD-10-CM | POA: Diagnosis not present

## 2020-12-29 DIAGNOSIS — N6489 Other specified disorders of breast: Secondary | ICD-10-CM

## 2020-12-29 DIAGNOSIS — N6311 Unspecified lump in the right breast, upper outer quadrant: Secondary | ICD-10-CM | POA: Diagnosis not present

## 2020-12-29 HISTORY — PX: BREAST BIOPSY: SHX20

## 2020-12-30 DIAGNOSIS — L648 Other androgenic alopecia: Secondary | ICD-10-CM | POA: Diagnosis not present

## 2021-01-01 ENCOUNTER — Other Ambulatory Visit: Payer: Self-pay | Admitting: Obstetrics & Gynecology

## 2021-01-01 DIAGNOSIS — N6011 Diffuse cystic mastopathy of right breast: Secondary | ICD-10-CM

## 2021-01-06 ENCOUNTER — Encounter: Payer: Self-pay | Admitting: Family Medicine

## 2021-01-06 ENCOUNTER — Ambulatory Visit (INDEPENDENT_AMBULATORY_CARE_PROVIDER_SITE_OTHER): Payer: BC Managed Care – PPO | Admitting: Family Medicine

## 2021-01-06 ENCOUNTER — Other Ambulatory Visit: Payer: Self-pay

## 2021-01-06 VITALS — BP 144/71 | HR 91 | Temp 98.0°F | Ht 63.0 in | Wt 172.5 lb

## 2021-01-06 DIAGNOSIS — Z13220 Encounter for screening for lipoid disorders: Secondary | ICD-10-CM

## 2021-01-06 DIAGNOSIS — R102 Pelvic and perineal pain unspecified side: Secondary | ICD-10-CM

## 2021-01-06 DIAGNOSIS — Z23 Encounter for immunization: Secondary | ICD-10-CM

## 2021-01-06 DIAGNOSIS — Z0000 Encounter for general adult medical examination without abnormal findings: Secondary | ICD-10-CM | POA: Diagnosis not present

## 2021-01-06 MED ORDER — DICYCLOMINE HCL 10 MG PO CAPS: 10.0000 mg | ORAL_CAPSULE | Freq: Three times a day (TID) | ORAL | 0 refills | Status: DC | PRN

## 2021-01-06 NOTE — Progress Notes (Signed)
Stacy Preston - 60 y.o. female MRN [id number]  Date of birth: 01-Sep-1960  Subjective No chief complaint on file.   HPI Stacy Preston is a 60 year old female here today for annual exam.  She does have a few additional concerns she would like to discuss today.  She continues to have chronic lower abdominal/pelvic pain.  She has had fairly extensive work-up including GYN evaluation, colonoscopy, CT scan and MRI.  She has been doing rehab for lower back pain which is improved.  This has not really improved her pelvic pain.  Pelvic pain is worsened with bowel movements and intercourse.  She does have pain with movement at times as well.  She did also have recent breast biopsy due to abnormality on mammogram.  This was benign.  She has done well since having this completed.  She has started stay fairly active.  She feels like her diet is doing okay.  She is a non-smoker.  She consumes alcohol occasionally.  She is due for updated flu vaccine.  She does plan to get shingles vaccine as well but not today.  ROS:  A comprehensive ROS was completed and negative except as noted per HPI   Allergies  Allergen Reactions   Lactose Intolerance (Gi)     Past Medical History:  Diagnosis Date   Allergy    History of basal cell carcinoma 01/09/2016   Hypertension    Infertility, female    Post-operative nausea and vomiting    Prolactinoma (HCC)    Vitamin D deficiency 01/09/2016    Past Surgical History:  Procedure Laterality Date   BREAST BIOPSY Right 12/29/2020   COLONOSCOPY  2013, 2017   [place]   ivf     LAPAROSCOPY     for endometriosis   PLANTAR FASCIA RELEASE     PLANTAR FASCIA SURGERY      Social History   Socioeconomic History   Marital status: Married    Spouse name: Not on file   Number of children: Not on file   Years of education: Not on file   Highest education level: Not on file  Occupational History   Not on file  Tobacco Use   Smoking status: Never    Smokeless tobacco: Never  Vaping Use   Vaping Use: Never used  Substance and Sexual Activity   Alcohol use: Yes    Alcohol/week: 2.0 standard drinks    Types: 2 Standard drinks or equivalent per week   Drug use: No   Sexual activity: Yes    Birth control/protection: None  Other Topics Concern   Not on file  Social History Narrative   Not on file   Social Determinants of Health   Financial Resource Strain: Not on file  Food Insecurity: Not on file  Transportation Needs: Not on file  Physical Activity: Not on file  Stress: Not on file  Social Connections: Not on file    Family History  Problem Relation Age of Onset   Renal Disease Mother    Diabetes Mother    Hypertension Mother    Cancer Father        Lung CA   Alcohol abuse Brother    Hypertension Brother    Sickle cell anemia Brother    Cancer Brother    Colon polyps Brother    Diabetes Maternal Aunt    Heart disease Maternal Grandmother    Diabetes Maternal Grandmother    Cancer Maternal Grandfather    Diabetes Maternal Grandfather  Colon cancer Maternal Grandfather    Stroke Paternal Grandmother    Heart disease Paternal Grandfather    Esophageal cancer Neg Hx    Rectal cancer Neg Hx    Stomach cancer Neg Hx     Health Maintenance  Topic Date Due   Pneumococcal Vaccine 38-27 Years old (1 - PCV) Never done   Zoster Vaccines- Shingrix (1 of 2) Never done   COVID-19 Vaccine (4 - Booster for Pfizer series) 03/03/2020   MAMMOGRAM  12/19/2022   PAP SMEAR-Modifier  12/20/2022   COLONOSCOPY (Pts 45-25yrs Insurance coverage will need to be confirmed)  08/05/2025   TETANUS/TDAP  01/08/2026   INFLUENZA VACCINE  Completed   Hepatitis C Screening  Completed   HIV Screening  Completed   HPV VACCINES  Aged Out      ----------------------------------------------------------------------------------------------------------------------------------------------------------------------------------------------------------------- Physical Exam BP (!) 144/71 (BP Location: Left Arm, Patient Position: Sitting, Cuff Size: Large)   Pulse 91   Temp 98 F (36.7 C)   Ht 5\' 3"  (1.6 m)   Wt 172 lb 8 oz (78.2 kg)   SpO2 99%   BMI 30.56 kg/m   Physical Exam Constitutional:      General: She is not in acute distress.    Appearance: Normal appearance.  HENT:     Head: Normocephalic and atraumatic.     Right Ear: Tympanic membrane and ear canal normal.     Left Ear: Tympanic membrane and ear canal normal.     Nose: Nose normal.  Eyes:     General: No scleral icterus.    Conjunctiva/sclera: Conjunctivae normal.  Neck:     Thyroid: No thyromegaly.  Cardiovascular:     Rate and Rhythm: Normal rate and regular rhythm.     Heart sounds: Normal heart sounds.  Pulmonary:     Effort: Pulmonary effort is normal.     Breath sounds: Normal breath sounds.  Abdominal:     General: Bowel sounds are normal. There is no distension.     Palpations: Abdomen is soft.     Tenderness: There is no abdominal tenderness. There is no guarding.  Musculoskeletal:        General: Normal range of motion.     Cervical back: Normal range of motion and neck supple.  Lymphadenopathy:     Cervical: No cervical adenopathy.  Skin:    General: Skin is warm and dry.     Findings: No rash.  Neurological:     General: No focal deficit present.     Mental Status: She is alert and oriented to person, place, and time.     Cranial Nerves: No cranial nerve deficit.     Coordination: Coordination normal.  Psychiatric:        Mood and Affect: Mood normal.        Behavior: Behavior normal.     ------------------------------------------------------------------------------------------------------------------------------------------------------------------------------------------------------------------- Assessment and Plan  Pelvic and perineal pain Continues to have pelvic pain that seems to worsen with intercourse and/or bowel movements.  She may have a component of pelvic floor dysfunction we can consider pelvic floor rehab evaluation.  Unable to add dicyclomine as well to see if there may be a component of colon spasm contributing.  We also did discuss referral back to GI if symptoms persist.  Well adult exam Well adult Orders Placed This Encounter  Procedures   Flu Vaccine QUAD 6+ mos PF IM (Fluarix Quad PF)   COMPLETE METABOLIC PANEL WITH GFR   Lipid Panel w/reflex Direct LDL  CBC with Differential  Screenings: Per lab orders Immunizations: Flu vaccine given.  She plans to have shingles vaccine. Anticipatory guidance/risk factor reduction: Recommendations per AVS.   Meds ordered this encounter  Medications   dicyclomine (BENTYL) 10 MG capsule    Sig: Take 1 capsule (10 mg total) by mouth 3 (three) times daily as needed for spasms.    Dispense:  30 capsule    Refill:  0    No follow-ups on file.    This visit occurred during the SARS-CoV-2 public health emergency.  Safety protocols were in place, including screening questions prior to the visit, additional usage of staff PPE, and extensive cleaning of exam room while observing appropriate contact time as indicated for disinfecting solutions.

## 2021-01-06 NOTE — Patient Instructions (Signed)
Preventive Care 92-61 Years Old, Female Preventive care refers to lifestyle choices and visits with your health care provider that can promote health and wellness. Preventive care visits are also called wellness exams. What can I expect for my preventive care visit? Counseling Your health care provider may ask you questions about your: Medical history, including: Past medical problems. Family medical history. Pregnancy history. Current health, including: Menstrual cycle. Method of birth control. Emotional well-being. Home life and relationship well-being. Sexual activity and sexual health. Lifestyle, including: Alcohol, nicotine or tobacco, and drug use. Access to firearms. Diet, exercise, and sleep habits. Work and work Statistician. Sunscreen use. Safety issues such as seatbelt and bike helmet use. Physical exam Your health care provider will check your: Height and weight. These may be used to calculate your BMI (body mass index). BMI is a measurement that tells if you are at a healthy weight. Waist circumference. This measures the distance around your waistline. This measurement also tells if you are at a healthy weight and may help predict your risk of certain diseases, such as type 2 diabetes and high blood pressure. Heart rate and blood pressure. Body temperature. Skin for abnormal spots. What immunizations do I need? Vaccines are usually given at various ages, according to a schedule. Your health care provider will recommend vaccines for you based on your age, medical history, and lifestyle or other factors, such as travel or where you work. What tests do I need? Screening Your health care provider may recommend screening tests for certain conditions. This may include: Lipid and cholesterol levels. Diabetes screening. This is done by checking your blood sugar (glucose) after you have not eaten for a while (fasting). Pelvic exam and Pap test. Hepatitis B test. Hepatitis C  test. HIV (human immunodeficiency virus) test. STI (sexually transmitted infection) testing, if you are at risk. Lung cancer screening. Colorectal cancer screening. Mammogram. Talk with your health care provider about when you should start having regular mammograms. This may depend on whether you have a family history of breast cancer. BRCA-related cancer screening. This may be done if you have a family history of breast, ovarian, tubal, or peritoneal cancers. Bone density scan. This is done to screen for osteoporosis. Talk with your health care provider about your test results, treatment options, and if necessary, the need for more tests. Follow these instructions at home: Eating and drinking  Eat a diet that includes fresh fruits and vegetables, whole grains, lean protein, and low-fat dairy products. Take vitamin and mineral supplements as recommended by your health care provider. Do not drink alcohol if: Your health care provider tells you not to drink. You are pregnant, may be pregnant, or are planning to become pregnant. If you drink alcohol: Limit how much you have to 0-1 drink a day. Know how much alcohol is in your drink. In the [place] one drink equals one 12 oz bottle of beer (355 mL), one 5 oz glass of wine (148 mL), or one 1 oz glass of hard liquor (44 mL). Lifestyle Brush your teeth every morning and night with fluoride toothpaste. Floss one time each day. Exercise for at least 30 minutes 5 or more days each week. Do not use any products that contain nicotine or tobacco. These products include cigarettes, chewing tobacco, and vaping devices, such as e-cigarettes. If you need help quitting, ask your health care provider. Do not use drugs. If you are sexually active, practice safe sex. Use a condom or other form of protection to prevent  STIs. If you do not wish to become pregnant, use a form of birth control. If you plan to become pregnant, see your health care provider for a  prepregnancy visit. Take aspirin only as told by your health care provider. Make sure that you understand how much to take and what form to take. Work with your health care provider to find out whether it is safe and beneficial for you to take aspirin daily. Find healthy ways to manage stress, such as: Meditation, yoga, or listening to music. Journaling. Talking to a trusted person. Spending time with friends and family. Minimize exposure to UV radiation to reduce your risk of skin cancer. Safety Always wear your seat belt while driving or riding in a vehicle. Do not drive: If you have been drinking alcohol. Do not ride with someone who has been drinking. When you are tired or distracted. While texting. If you have been using any mind-altering substances or drugs. Wear a helmet and other protective equipment during sports activities. If you have firearms in your house, make sure you follow all gun safety procedures. Seek help if you have been physically or sexually abused. What's next? Visit your health care provider once a year for an annual wellness visit. Ask your health care provider how often you should have your eyes and teeth checked. Stay up to date on all vaccines. This information is not intended to replace advice given to you by your health care provider. Make sure you discuss any questions you have with your health care provider. Document Revised: 08/06/2020 Document Reviewed: 08/06/2020 Elsevier Patient Education  [employer].

## 2021-01-06 NOTE — Assessment & Plan Note (Signed)
Well adult Orders Placed This Encounter  Procedures  . Flu Vaccine QUAD 6+ mos PF IM (Fluarix Quad PF)  . COMPLETE METABOLIC PANEL WITH GFR  . Lipid Panel w/reflex Direct LDL  . CBC with Differential  Screenings: Per lab orders Immunizations: Flu vaccine given.  She plans to have shingles vaccine. Anticipatory guidance/risk factor reduction: Recommendations per AVS.

## 2021-01-06 NOTE — Assessment & Plan Note (Signed)
Continues to have pelvic pain that seems to worsen with intercourse and/or bowel movements.  She may have a component of pelvic floor dysfunction we can consider pelvic floor rehab evaluation.  Unable to add dicyclomine as well to see if there may be a component of colon spasm contributing.  We also did discuss referral back to GI if symptoms persist.

## 2021-01-07 LAB — LIPID PANEL W/REFLEX DIRECT LDL
Cholesterol: 163 mg/dL (ref <200)
HDL: 69 mg/dL (ref >=50)
LDL Cholesterol (Calc): 76 mg/dL (calc)
Non-HDL Cholesterol (Calc): 94 mg/dL (calc) (ref <130)
Total CHOL/HDL Ratio: 2.4 (calc) (ref <5.0)
Triglycerides: 92 mg/dL (ref <150)

## 2021-01-07 LAB — COMPLETE METABOLIC PANEL WITH GFR
AG Ratio: 1.4 (calc) (ref 1.0–2.5)
ALT: 17 U/L (ref 6–29)
AST: 20 U/L (ref 10–35)
Albumin: 4 g/dL (ref 3.6–5.1)
Alkaline phosphatase (APISO): 74 U/L (ref 37–153)
BUN: 16 mg/dL (ref 7–25)
CO2: 29 mmol/L (ref 20–32)
Calcium: 9 mg/dL (ref 8.6–10.4)
Chloride: 105 mmol/L (ref 98–110)
Creat: 0.66 mg/dL (ref 0.50–1.05)
Globulin: 2.8 g/dL (calc) (ref 1.9–3.7)
Glucose, Bld: 87 mg/dL (ref 65–99)
Potassium: 4.4 mmol/L (ref 3.5–5.3)
Sodium: 140 mmol/L (ref 135–146)
Total Bilirubin: 0.5 mg/dL (ref 0.2–1.2)
Total Protein: 6.8 g/dL (ref 6.1–8.1)
eGFR: 100 mL/min/{1.73_m2} (ref >=60)

## 2021-01-07 LAB — CBC WITH DIFFERENTIAL/PLATELET
Absolute Monocytes: 688 cells/uL (ref 200–950)
Basophils Absolute: 52 cells/uL (ref 0–200)
Basophils Relative: 0.6 %
Eosinophils Absolute: 267 cells/uL (ref 15–500)
Eosinophils Relative: 3.1 %
HCT: 39.5 % (ref 35.0–45.0)
Hemoglobin: 12.6 g/dL (ref 11.7–15.5)
Lymphs Abs: 2528 cells/uL (ref 850–3900)
MCH: 29 pg (ref 27.0–33.0)
MCHC: 31.9 g/dL — ABNORMAL LOW (ref 32.0–36.0)
MCV: 91 fL (ref 80.0–100.0)
MPV: 10.3 fL (ref 7.5–12.5)
Monocytes Relative: 8 %
Neutro Abs: 5065 cells/uL (ref 1500–7800)
Neutrophils Relative %: 58.9 %
Platelets: 282 10*3/uL (ref 140–400)
RBC: 4.34 10*6/uL (ref 3.80–5.10)
RDW: 12.9 % (ref 11.0–15.0)
Total Lymphocyte: 29.4 %
WBC: 8.6 10*3/uL (ref 3.8–10.8)

## 2021-01-13 DIAGNOSIS — N952 Postmenopausal atrophic vaginitis: Secondary | ICD-10-CM | POA: Diagnosis not present

## 2021-01-13 DIAGNOSIS — R102 Pelvic and perineal pain: Secondary | ICD-10-CM | POA: Diagnosis not present

## 2021-01-13 DIAGNOSIS — Z1151 Encounter for screening for human papillomavirus (HPV): Secondary | ICD-10-CM | POA: Diagnosis not present

## 2021-01-13 DIAGNOSIS — Z124 Encounter for screening for malignant neoplasm of cervix: Secondary | ICD-10-CM | POA: Diagnosis not present

## 2021-01-13 DIAGNOSIS — Z01419 Encounter for gynecological examination (general) (routine) without abnormal findings: Secondary | ICD-10-CM | POA: Diagnosis not present

## 2021-01-13 DIAGNOSIS — R87810 Cervical high risk human papillomavirus (HPV) DNA test positive: Secondary | ICD-10-CM | POA: Diagnosis not present

## 2021-01-14 DIAGNOSIS — Z1151 Encounter for screening for human papillomavirus (HPV): Secondary | ICD-10-CM | POA: Diagnosis not present

## 2021-01-14 DIAGNOSIS — R1030 Lower abdominal pain, unspecified: Secondary | ICD-10-CM | POA: Diagnosis not present

## 2021-01-14 DIAGNOSIS — R102 Pelvic and perineal pain: Secondary | ICD-10-CM | POA: Diagnosis not present

## 2021-01-14 DIAGNOSIS — M9905 Segmental and somatic dysfunction of pelvic region: Secondary | ICD-10-CM | POA: Diagnosis not present

## 2021-01-14 DIAGNOSIS — N809 Endometriosis, unspecified: Secondary | ICD-10-CM | POA: Diagnosis not present

## 2021-01-14 DIAGNOSIS — Z01419 Encounter for gynecological examination (general) (routine) without abnormal findings: Secondary | ICD-10-CM | POA: Diagnosis not present

## 2021-01-20 DIAGNOSIS — C4401 Basal cell carcinoma of skin of lip: Secondary | ICD-10-CM | POA: Diagnosis not present

## 2021-01-20 DIAGNOSIS — D485 Neoplasm of uncertain behavior of skin: Secondary | ICD-10-CM | POA: Diagnosis not present

## 2021-01-21 ENCOUNTER — Other Ambulatory Visit: Payer: Self-pay

## 2021-01-21 ENCOUNTER — Ambulatory Visit: Payer: Self-pay

## 2021-01-21 ENCOUNTER — Ambulatory Visit: Payer: BC Managed Care – PPO | Admitting: Family Medicine

## 2021-01-21 VITALS — BP 128/78 | HR 87 | Ht 63.0 in | Wt 171.4 lb

## 2021-01-21 DIAGNOSIS — G8929 Other chronic pain: Secondary | ICD-10-CM

## 2021-01-21 DIAGNOSIS — M5416 Radiculopathy, lumbar region: Secondary | ICD-10-CM | POA: Diagnosis not present

## 2021-01-21 DIAGNOSIS — M25561 Pain in right knee: Secondary | ICD-10-CM | POA: Diagnosis not present

## 2021-01-21 DIAGNOSIS — R102 Pelvic and perineal pain: Secondary | ICD-10-CM

## 2021-01-21 NOTE — Progress Notes (Signed)
I, [name], LAT, ATC acting as a scribe for [name], MD.  Stacy Preston is a 60 y.o. female who presents to [place] at [facility] today for continued R knee pain predominantly due to posterior medial meniscus tear and medial compartment DJD. Pt was last seen by Dr. Georgina Snell on 08/29/20 and her R knee MRI was reviewed and she was referred to orthopedic surgery. Pt saw Dr. Lorin Mercy at [facility] on 09/01/20. Today, pt reports R knee started hurting pretty badly over the last month, w/ swelling over the lateral aspect. Pt would like to avoid surgery if possible. Pt is also still experiencing the pelvic pain, has had testing by several providers, but no resolution.  Her OB/GYN thinks that her pelvic pain is related to pelvic floor dysfunction.  Additionally she has some pain in her left leg thought to be radicular pain from her lumbar spine.  This is a chronic ongoing issue to worsening recently.  She never did have an epidural steroid injection but is interested if it may help.  Dx imaging: 08/09/20 R knee MRI  08/03/20 R knee XR  09/26/18 R knee XR  Pertinent review of systems: No fevers or chills  Relevant historical information: Chronic pelvic pain.  Lumbar radiculopathy.   Exam:  BP 128/78   Pulse 87   Ht 5\' 3"  (1.6 m)   Wt 171 lb 6.4 oz (77.7 kg)   SpO2 100%   BMI 30.36 kg/m  General: Well Developed, well nourished, and in no acute distress.   MSK: Right knee normal-appearing mild joint effusion.  Normal motion.  Palpation medial joint line.  Stable ligamentous exam.  L-spine decreased lumbar motion.  Lower extremity strength is intact.    Lab and Radiology Results  Procedure: Real-time Ultrasound Guided Injection of right knee superior lateral patellar space Device: Philips Affiniti 50G Images permanently stored and available for review in PACS Verbal informed consent obtained.  Discussed risks and benefits of procedure. Warned about infection  bleeding damage to structures skin hypopigmentation and fat atrophy among others. Patient expresses understanding and agreement Time-out conducted.   Noted no overlying erythema, induration, or other signs of local infection.   Skin prepped in a sterile fashion.   Local anesthesia: Topical Ethyl chloride.   With sterile technique and under real time ultrasound guidance: 40 mg of Kenalog and 2 mL of Marcaine injected into knee joint. Fluid seen entering the joint capsule.   Completed without difficulty   Pain immediately resolved suggesting accurate placement of the medication.   Advised to call if fevers/chills, erythema, induration, drainage, or persistent bleeding.   Images permanently stored and available for review in the ultrasound unit.  Impression: Technically successful ultrasound guided injection.     EXAM: MRI LUMBAR SPINE WITHOUT CONTRAST   TECHNIQUE: Multiplanar, multisequence MR imaging of the lumbar spine was performed. No intravenous contrast was administered.   COMPARISON:  Lumbar spine radiographs 11/17/2013   FINDINGS: Segmentation: 5 non rib-bearing lumbar type vertebral bodies are present. The lowest fully formed vertebral body is L5.   Alignment:  No significant listhesis is present.   Vertebrae: Remote superior endplate fractures present at T12. No retropulsed bone present.   Conus medullaris and cauda equina: Conus extends to the L2 level. Conus and cauda equina appear normal.   Paraspinal and other soft tissues: Limited imaging the abdomen is unremarkable. There is no significant adenopathy. No solid organ lesions are present.   Disc levels:  L1-2: Negative.   L2-3: Mild disc bulging facet hypertrophy contribute to mild foraminal narrowing bilaterally   L3-4: A right paramedian disc extrusion extends superior to the inferior endplate of L3 by over 7 mm. There is some encroachment on the central canal. Mild subarticular narrowing present  bilaterally. Mild right-greater-than-left foraminal narrowing is present.   L4-5: A broad-based disc protrusion is present. Asymmetric left-sided facet hypertrophy noted. Mild left subarticular and moderate left foraminal narrowing is present. Right foraminal narrowing is mild.   L5-S1: Broad-based disc protrusion is asymmetric to the right. Mild facet hypertrophy is worse on the right. Moderate right and mild left foraminal narrowing is present. The central canal is patent.   IMPRESSION: 1. Multilevel spondylosis of the lumbar spine as described. 2. Mild foraminal narrowing bilaterally at L2-3 secondary to disc bulging and facet hypertrophy. 3. Right paramedian disc extrusion at L3-4 extends superior to the inferior endplate of L3 by over 7 mm. There is some encroachment on the central canal. Mild subarticular narrowing is present bilaterally. 4. Mild right-greater-than-left foraminal narrowing at L3-4. 5. Mild left subarticular and moderate left foraminal narrowing at L4-5. 6. Moderate right and mild left foraminal stenosis at L5-S1.     Electronically Signed   By: San Morelle M.D.   On: 05/25/2020 13:04  I, [name], personally (independently) visualized and performed the interpretation of the images attached in this note.   Assessment and Plan: 60 y.o. female with right knee pain, chronic pelvic pain, lumbar radiculopathy.  Right knee pain.  Exacerbation of DJD.  Plan for steroid injection.  Continue Voltaren gel.  Recheck as needed.  Chronic pelvic pain: Long ongoing issue.  Very likely pelvic floor dysfunction.  I do agree with her OB/GYN.  Her OB/GYN is looking for a pelvic floor physical therapist in [place].  I think this would be a better solution but I am not aware of 1.  Will refer back to [place] pelvic [place].  If she can find a location closer to where she lives she will let me know and I  will refer to there.  Lumbar radiculopathy left side.  Patient has some lumbar radiculopathy.  I wonder if some of her pelvic pain may be sacral radiculopathy.  Plan for epidural steroid injection.   PDMP not reviewed this encounter. Orders Placed This Encounter  Procedures   [place] LIMITED JOINT SPACE STRUCTURES LOW RIGHT(NO LINKED CHARGES)    Standing Status:   Future    Number of Occurrences:   1    Standing Expiration Date:   07/21/2021    Order Specific Question:   Reason for Exam (SYMPTOM  OR DIAGNOSIS REQUIRED)    Answer:   right knee pain    Order Specific Question:   Preferred imaging location?    Answer:   [facility]   DG INJECT DIAG/THERA/INC NEEDLE/CATH/PLC EPI/LUMB/SAC W/IMG    Standing Status:   Future    Standing Expiration Date:   01/21/2022    Order Specific Question:   Reason for Exam (SYMPTOM  OR DIAGNOSIS REQUIRED)    Answer:   left lumbar rad. Level and technique per radiology    Order Specific Question:   Preferred Imaging Location?    Answer:   [facility]    Order Specific Question:   Radiology Contrast Protocol - do NOT remove file path    Answer:   [email or web address]   Ambulatory referral to Physical  Therapy    Referral Priority:   Routine    Referral Type:   Physical Medicine    Referral Reason:   Specialty Services Required    Requested Specialty:   Physical Therapy    Number of Visits Requested:   1   No orders of the defined types were placed in this encounter.    Discussed warning signs or symptoms. Please see discharge instructions. Patient expresses understanding.   The above documentation has been reviewed and is accurate and complete [name], M.D.   CC. OBGYN

## 2021-01-21 NOTE — Patient Instructions (Signed)
Thank you for coming in today.   Please call [facility] at [phone] to schedule your spine injection.    Proceed to pelvic PT.  Let me know if you find a different location you would like to use.   Recheck with me as needed.   Call or go to the ER if you develop a large red swollen joint with extreme pain or oozing puss.

## 2021-02-02 ENCOUNTER — Ambulatory Visit
Admission: RE | Admit: 2021-02-02 | Discharge: 2021-02-02 | Disposition: A | Discharge status: Home / Self Care | Payer: BC Managed Care – PPO | Source: Ambulatory Visit | Attending: Family Medicine | Admitting: Family Medicine

## 2021-02-02 DIAGNOSIS — Z0389 Encounter for observation for other suspected diseases and conditions ruled out: Secondary | ICD-10-CM | POA: Diagnosis not present

## 2021-02-02 DIAGNOSIS — M5416 Radiculopathy, lumbar region: Secondary | ICD-10-CM

## 2021-02-02 MED ORDER — IOPAMIDOL (ISOVUE-M 300) INJECTION 61%
1.0000 mL | Freq: Once | INTRAMUSCULAR | Status: AC | PRN
  Administered 2021-02-02: 1 mL via EPIDURAL

## 2021-02-02 MED ORDER — METHYLPREDNISOLONE ACETATE 40 MG/ML INJ SUSP (RADIOLOG
80.0000 mg | Freq: Once | INTRAMUSCULAR | Status: AC
  Administered 2021-02-02: 80 mg via EPIDURAL

## 2021-02-02 NOTE — Discharge Instructions (Signed)

## 2021-02-05 DIAGNOSIS — N950 Postmenopausal bleeding: Secondary | ICD-10-CM | POA: Diagnosis not present

## 2021-02-05 DIAGNOSIS — N809 Endometriosis, unspecified: Secondary | ICD-10-CM | POA: Diagnosis not present

## 2021-02-06 ENCOUNTER — Encounter: Payer: Self-pay | Admitting: Family Medicine

## 2021-02-11 DIAGNOSIS — C4401 Basal cell carcinoma of skin of lip: Secondary | ICD-10-CM | POA: Diagnosis not present

## 2021-02-18 DIAGNOSIS — L905 Scar conditions and fibrosis of skin: Secondary | ICD-10-CM | POA: Diagnosis not present

## 2021-03-24 ENCOUNTER — Other Ambulatory Visit: Payer: Self-pay

## 2021-03-24 MED ORDER — LOSARTAN POTASSIUM 25 MG PO TABS: 25.0000 mg | ORAL_TABLET | Freq: Every day | ORAL | 3 refills | Status: DC

## 2021-04-01 ENCOUNTER — Other Ambulatory Visit: Payer: Self-pay

## 2021-04-01 ENCOUNTER — Encounter: Payer: Self-pay | Admitting: Family Medicine

## 2021-04-01 NOTE — Telephone Encounter (Signed)
[facility] on 03/24/21.

## 2021-04-10 ENCOUNTER — Telehealth: Payer: Self-pay

## 2021-04-10 NOTE — Telephone Encounter (Signed)
Patient left a vm msg stating that she received a statement from [place]. Per patient, the diagnosis code needs to be updated to cover the Lipid panel test.

## 2021-04-13 ENCOUNTER — Encounter: Payer: Self-pay | Admitting: Family Medicine

## 2021-04-13 NOTE — Telephone Encounter (Signed)
Pt called again today about DOS 11/15. [facility] is requesting a dx code for the Lipid Panel that proves medical necessity.

## 2021-04-16 NOTE — Telephone Encounter (Signed)
Submitted new codes to [facility]. Patient is aware.

## 2021-04-29 ENCOUNTER — Encounter: Payer: Self-pay | Admitting: Gastroenterology

## 2021-04-29 ENCOUNTER — Other Ambulatory Visit: Payer: Self-pay

## 2021-04-29 ENCOUNTER — Ambulatory Visit (INDEPENDENT_AMBULATORY_CARE_PROVIDER_SITE_OTHER): Payer: Managed Care, Other (non HMO) | Admitting: Gastroenterology

## 2021-04-29 VITALS — BP 122/82 | HR 82 | Ht 63.0 in | Wt 164.4 lb

## 2021-04-29 DIAGNOSIS — K5900 Constipation, unspecified: Secondary | ICD-10-CM | POA: Diagnosis not present

## 2021-04-29 DIAGNOSIS — R195 Other fecal abnormalities: Secondary | ICD-10-CM

## 2021-04-29 DIAGNOSIS — R102 Pelvic and perineal pain: Secondary | ICD-10-CM

## 2021-04-29 DIAGNOSIS — K602 Anal fissure, unspecified: Secondary | ICD-10-CM

## 2021-04-29 MED ORDER — AMBULATORY NON FORMULARY MEDICATION: 1 refills | Status: DC

## 2021-04-29 NOTE — Progress Notes (Signed)
? ?Chief Complaint:    Constipation, lower abdominal pain, pelvic pain ? ?GI History: 61 year old female with a history of BCC, HTN, vitamin D deficiency. Family history of colon cancer in her maternal grandfather. ? ?-Colonoscopy 2005 unremarkable per patient ?-Colonoscopy 09/2011 in [place] notable for polyp (pathology unknown) ?-Colonoscopy in 2017 at [facility] normal.  Recommended repeat in 5 years due to prior history of polyps ?- Colonoscopy 07/2020: 3 small polyps (path: TA x2), 8 mm sigmoid polyp (path: HP), 3 mm rectal polyp (path: HP).  Internal hemorrhoids.  Repeat in 7 years ? ?HPI:   ? ? ?Patient is a 61 y.o. female presenting to the [facility]astro[facility]ology Clinic for evaluation of constipation and pelvic pain. ? ?Was seen by Dr. Earleen Newport in the OB/GYN clinic for follow-up on 04/03/2021 for postoperative follow-up after diagnostic laparoscopy and D&C hysteroscopy on 03/13/2021 for evaluation of chronic pelvic pain and postmenopausal bleeding.  Though studies were negative and no evidence of endometriosis.  Suspected pelvic pain is due to pelvic floor muscle spasm related to chronic back pain.  Recommended dedicated pelvic physical therapy.  Was initially referred back to GI for evaluation of constipation. ? ?Pelvic pain started 02/2020 following car ride to [place] with subsequent work-up as below: ?- 03/15/2020: Diagnosed with T12 compression fracture by x-ray  ?-05/15/2020: CT A/P: Hepatic steatosis, scattered hepatic cysts the largest measuring 2.4 cm.  Diverticulosis without diverticulitis, otherwise normal GI tract. T12 compression fx ?- 05/24/2020: MRI L-spine: Multilevel spondylosis, mild foraminal narrowing bilaterally at L2-3 secondary to disc bulging and facet hypertrophy.  Disc extrusion at L3-4 with some encroachment on central canal.  Foraminal narrowing L3-4, mild and moderate left foraminal narrowing at L4-5, moderate right and mild left foraminal stenosis at L5-S1. ?- 07/2020 pelvic floor PT for  back pain. Went to a couple sessions, but stopped when she tore her meniscus. Back pain has resolved. ?- 07/2020: Colonoscopy with 2 tubular adenomas.  Repeat 7 years ?- 01/2021: Lumbar epidural injection on left side ?- 02/2021: Hysteroscopy, biopsy, D&C, laparoscopy ? ?Pelvic pain tends to be after BM, intercourse. Increased straining to have BM for many months now. Decreased frequency.  ? ?Previous baseline was 3 BM/week without straining.  Now, still has urge to have BM 3-4 days/week, but increased straining/pushing to have BM. Occasional BRBPR.  ? ?Started phentermine in 12/2019 along with diet.  She previously would have a high-fiber shake with psyllium every morning, but stopped that when she started her new diet.  Decreased BM and straining started about then. Drinks <1 glass of water/day.  ? ?Interntionally lost 30-35# through dietary mods.   ? ? ?CBC Latest Ref Rng & Units 01/06/2021 11/28/2019 07/11/2018  ?WBC 3.8 - 10.8 Thousand/uL 8.6 6.7 6.9  ?Hemoglobin 11.7 - 15.5 g/dL 12.6 13.1 13.2  ?Hematocrit 35.0 - 45.0 % 39.5 40.3 40.2  ?Platelets 140 - 400 Thousand/uL 282 265 288  ? ?CMP Latest Ref Rng & Units 01/06/2021 11/28/2019 07/11/2018  ?Glucose 65 - 99 mg/dL 87 103(H) 98  ?BUN 7 - 25 mg/dL '16 16 15  '$ ?Creatinine 0.50 - 1.05 mg/dL 0.66 0.97 0.92  ?Sodium 135 - 146 mmol/L 140 142 141  ?Potassium 3.5 - 5.3 mmol/L 4.4 4.6 4.6  ?Chloride 98 - 110 mmol/L 105 106 106  ?CO2 20 - 32 mmol/L '29 29 27  '$ ?Calcium 8.6 - 10.4 mg/dL 9.0 9.3 9.1  ?Total Protein 6.1 - 8.1 g/dL 6.8 6.9 6.8  ?Total Bilirubin 0.2 - 1.2 mg/dL 0.5 0.5 0.6  ?  Alkaline Phos 33 - 130 U/L - - -  ?AST 10 - 35 U/L '20 18 19  '$ ?ALT 6 - 29 U/L '17 15 16  '$ ? ? ? ?Review of systems:     No chest pain, no SOB, no fevers, no urinary sx  ? ?Past Medical History:  ?Diagnosis Date  ? Allergy   ? History of basal cell carcinoma 01/09/2016  ? Hypertension   ? Infertility, female   ? Post-operative nausea and vomiting   ? Prolactinoma ([place])   ? Vitamin D deficiency  01/09/2016  ? ? ?Patient's surgical history, family medical history, social history, medications and allergies were all reviewed in Epic  ? ? ?Current Outpatient Medications  ?Medication Sig Dispense Refill  ? Albuterol Sulfate (PROAIR RESPICLICK) 650 (90 Base) MCG/ACT AEPB Inhale 2 puffs into the lungs every 6 (six) hours as needed. (Patient not taking: Reported on 01/21/2021) 1 each 2  ? ANUCORT-HC 25 MG suppository INSERT 1 SUPPOSITORY RECTALLY TWICE DAILY AS NEEDED FOR HEMORRHOIDS 24 suppository 0  ? cholecalciferol (VITAMIN D) 1000 UNITS tablet Take 2,000 Units by mouth daily.    ? dicyclomine (BENTYL) 10 MG capsule Take 1 capsule (10 mg total) by mouth 3 (three) times daily as needed for spasms. (Patient not taking: Reported on 01/21/2021) 30 capsule 0  ? estradiol (ESTRACE) 0.1 MG/GM vaginal cream Per vagina three times per week (Patient taking differently: 2 (two) times a week. Per vagina three times per week) 42.5 g 12  ? hydrocortisone (ANUSOL-HC) 25 MG suppository Place rectally.    ? losartan (COZAAR) 25 MG tablet Take 1 tablet (25 mg total) by mouth daily. 90 tablet 3  ? medium chain triglycerides (MCT OIL) oil Take by mouth 3 (three) times daily.    ? MELATONIN PO Take by mouth.    ? phentermine 37.5 MG capsule Take 37.5 mg by mouth every morning.    ? vitamin C (ASCORBIC ACID) 500 MG tablet Take 500 mg by mouth daily.    ? ?No current facility-administered medications for this visit.  ? ? ?Physical Exam:   ? ? ?There were no vitals taken for this visit. ? ?GENERAL:  Pleasant female in NAD ?PSYCH: : Cooperative, normal affect ?ABDOMEN:  Nondistended, soft, nontender ?SKIN:  turgor, no lesions seen ?Musculoskeletal:  Normal muscle tone, normal strength ?NEURO: Alert and oriented x 3, no focal neurologic deficits ?Rectal exam: Sensation intact and preserved anal wink.  Small external skin tag.  No external anal fissures, external hemorrhoids. Normal sphincter tone. No palpable mass. No blood on the exam  glove.  Anoscopy with grade 1 internal hemorrhoids and a small anterior internal anal fissure.  Normal squeeze pressure.  Appropriate anal sphincter relaxation and rectal muscle descent with "push phase".  (Chaperone: Renee Rival, CMA).  ? ? ? ?IMPRESSION and PLAN:   ? ?1) Anal Fissure: ?Physical exam notable for external anal fissure in the anterior (3:00 when in left lateral) position. Will treat as below: ? ?- Start topical NTG 0.125%, apply a small, pea-sized amount to the affected area BID for 6-8 weeks.  ?- We discussed the ADR of headache, and if patient does experience, to call me and will change and make pharmacy request to compound topical CCB (topical nifedipine 0.2-0.3% applied 2-4 times daily; unfortunately, the CCB also carries an ADR of headache in 5-12%). Additionally, cautioned to avoid strenuous activity within 30 minutes of application  ?- Start fiber supplement for a goal of regular, soft, stools without straining  to have a bowel movement. ?- Start MiraLAX 1 cap/day for goal of soft stools without straining to have BM ?-Would benefit from sitz bath with warm water for 10-15 minutes 2-3 times daily; directed to [employer] available online or at [employer]; ensure to dry area afterwards ? ?2) Constipation ?Clinically, symptoms seem consistent with pelvic floor dyssynergia, but exam was somewhat inconsistent with that diagnosis. ?- Start MiraLAX 1 cap/day ?- Increase water intake to at least 64 ounces/day ?- Restart fiber supplement as above ?- If symptoms persist, refer for Anorectal Manometry and possible pelvic floor PT  ? ?3) Pelvic pain ?- Evaluate for improvement with improvement in bowel habits and treatment of anal fissure ? ?- To f/u in the clinic in 3 months or sooner prn ? ?   ?    ? ?Lavena Bullion ,DO, FACG 04/29/2021, 2:59 PM ? ?

## 2021-04-29 NOTE — Patient Instructions (Signed)
If you are age 62 or older, your body mass index should be between 23-30. Your Body mass index is 29.12 kg/m?Marland Kitchen If this is out of the aforementioned range listed, please consider follow up with your Primary Care Provider. ? ?If you are age 35 or younger, your body mass index should be between 19-25. Your Body mass index is 29.12 kg/m?Marland Kitchen If this is out of the aformentioned range listed, please consider follow up with your Primary Care Provider.  ? ?________________________________________________________ ? ?The [facility] providers would like to encourage you to use [facility] to communicate with providers for non-urgent requests or questions.  Due to long hold times on the telephone, sending your provider a message by [facility] may be a faster and more efficient way to get a response.  Please allow 48 business hours for a response.  Please remember that this is for non-urgent requests.  ?_______________________________________________________ ? ?We have sent the following medications to your pharmacy for you to pick up at your convenience: ?Nitroglycerin  ?[facility]'s information is below: ?Address: [address], [place], [place] [address]  ?[phone] ? ? ?Please purchase the following medications over the counter and take as directed: ?Miralax 17g daily ? ?Please call in 3-6 months to schedule an office visit. ? ?It was a pleasure to see you today! ? ?Gerrit Heck, D.O. ? ?

## 2021-05-14 ENCOUNTER — Encounter: Payer: Self-pay | Admitting: Family Medicine

## 2021-05-15 NOTE — Progress Notes (Signed)
? ?I, Wendy Poet, LAT, ATC, am serving as scribe for Dr. [name]. ? ?Stacy Preston is a 61 y.o. female who presents to [place] at [facility] today for f/u of chronic R knee pain due to posterior medial meniscus tear and medial compartment DJD.  She was last seen by Dr. Georgina Snell on 01/21/21 and had a R knee steroid injection.  She was previously referred to Ortho but wanted to avoid surgery if possible.  She also noted L leg pain thought to be radicular in nature and was referred for a lumbar ESI that she had on 02/02/21.  Today, pt reports R knee has been painful and really started to hurt again towards the end of Jan. Pt notes swelling with increased activity. Pt is wondering at what point to decide to have surgery. Pt has continued to have the L-sided sciatica and pelvic pain.  She had a laparoscopic pelvic surgery for endometriosis in January which helped some although she continues to experience pelvic pain.  She did have pelvic physical therapy last year but has not had any since her surgery. ? ?Dx imaging: Pelvis MRI- 08/11/20 ?08/09/20 R knee MRI ?            08/03/20 R knee XR ? 05/24/20: L-spine MRI ?            09/26/18 R knee XR ?Pertinent review of systems: No fevers or chills ? ?Relevant historical information: Chronic pelvic pain ? ? ?Exam:  ?BP 138/80   Pulse 96   Ht '5\' 3"'$  (1.6 m)   Wt 163 lb 12.8 oz (74.3 kg)   SpO2 99%   BMI 29.02 kg/m?  ?General: Well Developed, well nourished, and in no acute distress.  ? ?MSK: Right knee: Normal-appearing ?Normal motion. ?Intact strength. ? ? ? ?Lab and Radiology Results ? ?Procedure: Real-time Ultrasound Guided Injection of right knee superior lateral patellar space ?Device: Philips Affiniti 50G ?Images permanently stored and available for review in PACS ?Verbal informed consent obtained.  Discussed risks and benefits of procedure. Warned about infection bleeding damage to structures skin hypopigmentation and fat atrophy among  others. ?Patient expresses understanding and agreement ?Time-out conducted.   ?Noted no overlying erythema, induration, or other signs of local infection.   ?Skin prepped in a sterile fashion.   ?Local anesthesia: Topical Ethyl chloride.   ?With sterile technique and under real time ultrasound guidance: 40 mg of Kenalog and 2 mL of Marcaine injected into right knee. Fluid seen entering the knee joint.   ?Completed without difficulty   ?Pain immediately resolved suggesting accurate placement of the medication.   ?Advised to call if fevers/chills, erythema, induration, drainage, or persistent bleeding.   ?Images permanently stored and available for review in the ultrasound unit.  ?Impression: Technically successful ultrasound guided injection. ? ? ? ? ? ? ? ?Assessment and Plan: ?61 y.o. female with right knee pain due to DJD.  She is in an awkward position where she has DJD that is bothersome but not quite bad enough to have a knee replacement.  Plan for repeat steroid injections moving towards hyaluronic acid injections eventually.  I think at some point in the not too distant future she will probably have bad enough pain that a knee replacement starts making sense. ? ?Additionally we talked about her chronic pelvic pain.  I think it is multifactorial from her endometriosis and likely from her sciatica related pain and possibly from piriformis or other pelvic nerve or muscle imbalance  related issues. ?I think a good step at this point would be a retrial of pelvic physical therapy.  We discussed options and will refer back to [facility] physical therapy.  We did have a look and there are no physical therapy locations that offer pelvic physical therapy in the [place] area that I am aware of or her primary care provider is aware of. ? ?Recheck back with me as needed. ? ?PDMP not reviewed this encounter. ?Orders Placed This Encounter  ?Procedures  ? [place] LIMITED JOINT SPACE STRUCTURES LOW RIGHT(NO LINKED CHARGES)  ?   Order Specific Question:   Reason for Exam (SYMPTOM  OR DIAGNOSIS REQUIRED)  ?  Answer:   R knee pain  ?  Order Specific Question:   Preferred imaging location?  ?  Answer:   [facility]  ? Ambulatory referral to Physical Therapy  ?  Referral Priority:   Routine  ?  Referral Type:   Physical Medicine  ?  Referral Reason:   Specialty Services Required  ?  Requested Specialty:   Physical Therapy  ?  Number of Visits Requested:   1  ? ?No orders of the defined types were placed in this encounter. ? ? ? ?Discussed warning signs or symptoms. Please see discharge instructions. Patient expresses understanding. ? ? ?The above documentation has been reviewed and is accurate and complete [name], M.D. ? ? ?

## 2021-05-18 ENCOUNTER — Encounter: Payer: Self-pay | Admitting: Family Medicine

## 2021-05-18 ENCOUNTER — Ambulatory Visit: Payer: Managed Care, Other (non HMO) | Admitting: Family Medicine

## 2021-05-18 ENCOUNTER — Other Ambulatory Visit: Payer: Self-pay

## 2021-05-18 ENCOUNTER — Ambulatory Visit: Payer: Self-pay

## 2021-05-18 VITALS — BP 138/80 | HR 96 | Ht 63.0 in | Wt 163.8 lb

## 2021-05-18 DIAGNOSIS — R102 Pelvic and perineal pain: Secondary | ICD-10-CM | POA: Diagnosis not present

## 2021-05-18 DIAGNOSIS — G8929 Other chronic pain: Secondary | ICD-10-CM | POA: Diagnosis not present

## 2021-05-18 DIAGNOSIS — M25561 Pain in right knee: Secondary | ICD-10-CM | POA: Diagnosis not present

## 2021-05-18 NOTE — Patient Instructions (Addendum)
Thank you for coming in today.  ? ?You received a steroid injection in your right knee today. Seek immediate medical attention if the joint becomes red, extremely painful, or is oozing fluid.  ? ?We are going to work to figure out the location for pelvic physical therapy. ? ?Recheck back with me as needed ?

## 2021-05-18 NOTE — Therapy (Signed)
?OUTPATIENT PHYSICAL THERAPY FEMALE PELVIC EVALUATION ? ? ?Patient Name: Stacy Preston ?MRN: [id number] ?DOB:1960-11-25, 61 y.o., female ?Today's Date: 05/19/2021 ? ? PT End of Session - 05/19/21 1226   ? ? Visit Number 1   ? Date for PT Re-Evaluation 08/11/21   ? Authorization Type cigna   ? PT Start Time 0840   ? PT Stop Time [phone]   ? PT Time Calculation (min) 43 min   ? Activity Tolerance Patient tolerated treatment well   ? Behavior During Therapy [facility] for tasks assessed/performed   ? ?  ?  ? ?  ? ? ?Past Medical History:  ?Diagnosis Date  ? Allergy   ? History of basal cell carcinoma 01/09/2016  ? Hypertension   ? Infertility, female   ? Post-operative nausea and vomiting   ? Prolactinoma (HCC)   ? Vitamin D deficiency 01/09/2016  ? ?Past Surgical History:  ?Procedure Laterality Date  ? BREAST BIOPSY Right 12/29/2020  ? COLONOSCOPY  2013, 2017  ? [place]  ? ivf    ? LAPAROSCOPY    ? for endometriosis  ? PLANTAR FASCIA RELEASE    ? PLANTAR FASCIA SURGERY    ? ?Patient Active Problem List  ? Diagnosis Date Noted  ? Acute medial meniscal tear, sequela 10/07/2020  ? Pain in right knee 09/01/2020  ? Lumbar radiculopathy 05/20/2020  ? Lower abdominal pain 05/20/2020  ? Knee pain 05/20/2020  ? Cervical stenosis (uterine cervix) 05/05/2020  ? Pelvic and perineal pain 04/24/2020  ? Cervical high risk HPV (human papillomavirus) test positive 01/08/2020  ? Well adult exam 11/28/2019  ? Elevated blood-pressure reading without diagnosis of hypertension 12/28/2018  ? Tinnitus 11/27/2018  ? GAD (generalized anxiety disorder) 12/21/2016  ? Atrophic vaginitis 02/27/2016  ? Vitamin D deficiency 01/09/2016  ? History of prolactinoma 01/09/2016  ? History of basal cell carcinoma 01/09/2016  ? Cervical disc disorder with radiculopathy of cervical region 01/09/2016  ? Elevated C-reactive protein (CRP) 01/09/2016  ? ? ?PCP: Everrett Coombe, DO ? ?REFERRING PROVIDER: Rodolph Bong, MD ? ?REFERRING DIAG: R10.2,G89.29 (ICD-10-CM) -  Chronic pelvic pain in female ? ?THERAPY DIAG:  ?Muscle weakness (generalized) - Plan: PT plan of care cert/re-cert ? ?Other muscle spasm - Plan: PT plan of care cert/re-cert ? ?Unspecified lack of coordination - Plan: PT plan of care cert/re-cert ? ?ONSET DATE: over one year ? ?SUBJECTIVE:                                                                                                                                                                                          ? ?SUBJECTIVE STATEMENT: ?  December had a lumbar epidural injection, then ruled out OB/GYN issues with laproscopy, I am on mirilax to help with constipation.  Has started having sciatic nerve pain.  BM and intercourse cause the pubic pain and it radiates.  Just inserting estrace cream causes some pain ? ?Fluid intake: coffee and 1 water, gets lot of fruit/veg ? ?Patient confirms identification and approves PT to assess pelvic floor and treatment Yes ? ? ?PAIN:  ?Are you having pain? Yes ?NPRS scale: 8/10 ?Pain location: Anterior and pubic symphasis ? ?Pain type: tight  ?Pain description:  muscular   ? ?Aggravating factors: standing and slightly leaning forward, BM, intercourse, up the steps feels a pulling ?Relieving factors: goes away on its own depends how long it takes ? ?PRECAUTIONS: None ? ?WEIGHT BEARING RESTRICTIONS No ? ?FALLS:  ?Has patient fallen in last 6 months? No ? ?LIVING ENVIRONMENT: ?Lives with: lives with their spouse ?Lives in: House/apartment ? ? ?OCCUPATION: full time; desk work in [employer] ? ?PLOF: Independent ? ?PATIENT GOALS get rid of the pain ? ?PERTINENT HISTORY:  ?labral tear in left hip; constipation,  ?Sexual abuse: No ? ?BOWEL MOVEMENT ?Pain with bowel movement: Yes ?Type of bowel movement:Type (Bristol Stool Scale) normal, Frequency every 2 days, and Strain No ?Fully empty rectum: Yes:   ?Leakage: No ?Pads: No ?Fiber supplement: Yes: mirilax ? ?URINATION ?Pain with urination: No ?Fully empty bladder: Yes:   ?Stream:  Strong ?Urgency: No ?Frequency: normal ?Leakage:  no ?Pads: No ? ?INTERCOURSE ?Pain with intercourse: Initial Penetration and After Intercourse ?Ability to have vaginal penetration:  Yes:   ?Climax: yes ?Marinoff Scale: 2/3 pain afterwards can be all weekend or just  half the day ? ?PREGNANCY ?N/A ? ?PROLAPSE ?None ? ? ? ?OBJECTIVE:  ? ?COGNITION: ? Overall cognitive status: Within functional limits for tasks assessed   ? ? ?MUSCLE LENGTH: ?Hamstrings: Right 80 deg; Left 70 deg ? ? ?LUMBAR SPECIAL TESTS:  ?ASLR compression helps ? ?FUNCTIONAL TESTS:  ?SLS normal ? ?GAIT: ? ?Comments: WFL ? ?POSTURE:  ?Trunk flexed, anterior pelvic tilt ? ?LUMBARAROM/PROM ? ?A/PROM A/PROM  ?05/19/2021  ?Flexion 50%  ?Extension   ?Right lateral flexion   ?Left lateral flexion   ?Right rotation   ?Left rotation   ? (Blank rows = not tested) ? ?LE ROM: ? ?Passive ROM Right ?05/19/2021 Left ?05/19/2021  ?Hip flexion    ?Hip extension    ?Hip abduction    ?Hip adduction    ?Hip internal rotation    ?Hip external rotation    ?Knee flexion    ?Knee extension    ?Ankle dorsiflexion    ?Ankle plantarflexion    ?Ankle inversion    ?Ankle eversion    ? (Blank rows = not tested) ? ?LE MMT: ? ?MMT Right ?05/19/2021 Left ?05/19/2021  ?Hip flexion    ?Hip extension    ?Hip abduction    ?Hip adduction    ?Hip internal rotation    ?Hip external rotation    ?Knee flexion    ?Knee extension    ?Ankle dorsiflexion    ?Ankle plantarflexion    ?Ankle inversion    ?Ankle eversion    ? ?PELVIC MMT: ?  ?MMT  ?05/19/2021  ?Vaginal 3/5 3 reps hold 4 sec  ?Internal Anal Sphincter 3/5  ?External Anal Sphincter 3/5  ?Puborectalis 3/5  ?Diastasis Recti   ?(Blank rows = not tested) ? ?      PALPATION: ?  General  tight along spine; Rt adductors  tight, pubic symphasis TTP ? ?              External Perineal Exam  ?              ?              Internal Pelvic Floor - Holding breath and slightly bulges when tightening; weaker on Lt side and more TTP; puborectalis and  pubococcygeus tight on Lt; higher tone and difficulty relaxing on the Rt ? ?TONE: ?Higher on Rt side ? ?PROLAPSE: ?no ? ?TODAY'S TREATMENT  ?EVAL gentle STM to puborectalis with education on deep diaphragmatic breathing ? ? ?PATIENT EDUCATION:  ?Education details: Access Code: [email or web address] and toileting techniques for BM ?Person educated: Patient ?Education method: Explanation, Demonstration, and Handouts ?Education comprehension: verbalized understanding and returned demonstration ? ? ?HOME EXERCISE PROGRAM: ?Access Code: [email or web address] ?URL: [email or web address] ?Date: 05/19/2021 ?Prepared by: Dwana Curd ? ?Exercises ?- Seated Diaphragmatic Breathing  - 3 x daily - 7 x weekly - 1 sets - 10 reps ? ?ASSESSMENT: ? ?CLINICAL IMPRESSION: ?Patient is a 61 y.o. female who was seen today for physical therapy evaluation and treatment for pelvic pain. Pt has weakness Lt>Rt in lower body. TTP of pelvic muscles mentioned.  Pt has hamstrings tight.  Decreased coordination of core and pelvic floor. Pt will benefit from skilled PT to address all above mentioned impairments for pain management and improved functional activities. ? ? ? ?OBJECTIVE IMPAIRMENTS decreased coordination, decreased endurance, decreased ROM, decreased strength, increased muscle spasms, impaired flexibility, impaired tone, postural dysfunction, and pain.  ? ?ACTIVITY LIMITATIONS community activity and interpersonal relationships .  ? ?PERSONAL FACTORS Time since onset of injury/illness/exacerbation and 1-2 comorbidities: constipation; chronic back pain  are also affecting patient's functional outcome.  ? ? ?REHAB POTENTIAL: Good ? ?CLINICAL DECISION MAKING: Evolving/moderate complexity ? ?EVALUATION COMPLEXITY: Moderate ? ? ?GOALS: ?Goals reviewed with patient? Yes ? ?SHORT TERM GOALS: Target date: 06/16/2021 ? ?20% reduction of pain ?Baseline:  ?Goal status: INITIAL ? ?2.  Pt will be ind with initial HEP ?Baseline:  ?Goal status:  INITIAL ? ? ?LONG TERM GOALS: Target date: 08/11/2021 ? ?Pt will be independent with advanced HEP to maintain improvements made throughout therapy ? ?Baseline:  ?Goal status: INITIAL ? ?2.  Pt will report 75% reduction of pain

## 2021-05-19 ENCOUNTER — Encounter: Payer: Self-pay | Admitting: Physical Therapy

## 2021-05-19 ENCOUNTER — Ambulatory Visit: Payer: Managed Care, Other (non HMO) | Attending: Family Medicine | Admitting: Physical Therapy

## 2021-05-19 DIAGNOSIS — M62838 Other muscle spasm: Secondary | ICD-10-CM | POA: Diagnosis present

## 2021-05-19 DIAGNOSIS — G8929 Other chronic pain: Secondary | ICD-10-CM | POA: Diagnosis not present

## 2021-05-19 DIAGNOSIS — R102 Pelvic and perineal pain: Secondary | ICD-10-CM | POA: Diagnosis not present

## 2021-05-19 DIAGNOSIS — M6281 Muscle weakness (generalized): Secondary | ICD-10-CM | POA: Insufficient documentation

## 2021-05-19 DIAGNOSIS — R279 Unspecified lack of coordination: Secondary | ICD-10-CM | POA: Diagnosis present

## 2021-05-25 ENCOUNTER — Ambulatory Visit: Payer: BC Managed Care – PPO | Admitting: Medical-Surgical

## 2021-06-18 ENCOUNTER — Encounter: Payer: Managed Care, Other (non HMO) | Admitting: Physical Therapy

## 2021-06-21 NOTE — Therapy (Addendum)
OUTPATIENT PHYSICAL THERAPY TREATMENT NOTE   Patient Name: Stacy Preston MRN: [id number] DOB:1960-04-10, 61 y.o., female Today's Date: 06/22/2021  PCP: Everrett Coombe, DO REFERRING PROVIDER: Rodolph Bong, MD  END OF SESSION:   PT End of Session - 06/22/21 1539     Visit Number 2    Date for PT Re-Evaluation 08/11/21    Authorization Type cigna    PT Start Time 1533    PT Stop Time 1614    PT Time Calculation (min) 41 min    Activity Tolerance Patient tolerated treatment well    Behavior During Therapy [facility] for tasks assessed/performed;Restless             Past Medical History:  Diagnosis Date   Allergy    History of basal cell carcinoma 01/09/2016   Hypertension    Infertility, female    Post-operative nausea and vomiting    Prolactinoma (HCC)    Vitamin D deficiency 01/09/2016   Past Surgical History:  Procedure Laterality Date   BREAST BIOPSY Right 12/29/2020   COLONOSCOPY  2013, 2017   [place]   ivf     LAPAROSCOPY     for endometriosis   PLANTAR FASCIA RELEASE     PLANTAR FASCIA SURGERY     Patient Active Problem List   Diagnosis Date Noted   Acute medial meniscal tear, sequela 10/07/2020   Pain in right knee 09/01/2020   Lumbar radiculopathy 05/20/2020   Lower abdominal pain 05/20/2020   Knee pain 05/20/2020   Cervical stenosis (uterine cervix) 05/05/2020   Pelvic and perineal pain 04/24/2020   Cervical high risk HPV (human papillomavirus) test positive 01/08/2020   Well adult exam 11/28/2019   Elevated blood-pressure reading without diagnosis of hypertension 12/28/2018   Tinnitus 11/27/2018   GAD (generalized anxiety disorder) 12/21/2016   Atrophic vaginitis 02/27/2016   Vitamin D deficiency 01/09/2016   History of prolactinoma 01/09/2016   History of basal cell carcinoma 01/09/2016   Cervical disc disorder with radiculopathy of cervical region 01/09/2016   Elevated C-reactive protein (CRP) 01/09/2016    REFERRING DIAG: R10.2,G89.29  (ICD-10-CM) - Chronic pelvic pain in female  THERAPY DIAG:  Muscle weakness (generalized)  Other muscle spasm  Unspecified lack of coordination  Radiculopathy, lumbar region  PERTINENT HISTORY: labral tear in left hip; constipation  PRECAUTIONS: none  SUBJECTIVE: BM are better but the pain is still there in the pubic symphysis.  If I am standing a long time the tingling down my Rt leg.  I have pain mostly after BM that is the worst and it depends how long it takes to go away.  PAIN:  Are you having pain? Yes: NPRS scale: 8/10 Pain location: pubic symphysis Pain description: intense sore Aggravating factors: BM Relieving factors: comes and goes   OBJECTIVE: (objective measures completed at initial evaluation unless otherwise dated)  COGNITION:            Overall cognitive status: Within functional limits for tasks assessed                  MUSCLE LENGTH: Hamstrings: Right 80 deg; Left 70 deg     LUMBAR SPECIAL TESTS:  ASLR compression helps   FUNCTIONAL TESTS:  SLS normal   GAIT:   Comments: WFL   POSTURE:  Trunk flexed, anterior pelvic tilt   LUMBARAROM/PROM   A/PROM A/PROM  05/19/2021  Flexion 50%  Extension    Right lateral flexion    Left lateral flexion  Right rotation    Left rotation     (Blank rows = not tested)   LE ROM:   Passive ROM Right 05/19/2021 Left 05/19/2021  Hip flexion      Hip extension      Hip abduction      Hip adduction      Hip internal rotation      Hip external rotation      Knee flexion      Knee extension      Ankle dorsiflexion      Ankle plantarflexion      Ankle inversion      Ankle eversion       (Blank rows = not tested)   LE MMT:   MMT Right 05/19/2021 Left 05/19/2021  Hip flexion      Hip extension      Hip abduction      Hip adduction      Hip internal rotation      Hip external rotation      Knee flexion      Knee extension      Ankle dorsiflexion      Ankle plantarflexion      Ankle  inversion      Ankle eversion        PELVIC MMT:   MMT   05/19/2021  Vaginal 3/5 3 reps hold 4 sec  Internal Anal Sphincter 3/5  External Anal Sphincter 3/5  Puborectalis 3/5  Diastasis Recti    (Blank rows = not tested)         PALPATION:   General  tight along spine; Rt adductors tight, pubic symphasis TTP                 External Perineal Exam                              Internal Pelvic Floor - Holding breath and slightly bulges when tightening; weaker on Lt side and more TTP; puborectalis and pubococcygeus tight on Lt; higher tone and difficulty relaxing on the Rt   TONE: Higher on Rt side   PROLAPSE: no   TODAY'S TREATMENT  EVAL gentle STM to puborectalis with education on deep diaphragmatic breathing     PATIENT EDUCATION:  Education details: Access Code: [email or web address] and toileting techniques for BM Person educated: Patient Education method: Explanation, Demonstration, and Handouts Education comprehension: verbalized understanding and returned demonstration     HOME EXERCISE PROGRAM: Access Code: [email or web address] URL: [email or web address] Date: 06/22/2021 Prepared by: Dwana Curd  Exercises - Seated Diaphragmatic Breathing  - 3 x daily - 7 x weekly - 1 sets - 10 reps - Sidelying Hip Adduction  - 1 x daily - 7 x weekly - 2 sets - 10 reps - Prone Hip Extension with Plantarflexion  - 1 x daily - 7 x weekly - 3 sets - 10 reps - Sidelying Hip Abduction  - 1 x daily - 7 x weekly - 3 sets - 10 reps - Pelvic Muscle Energy Technique With Flexion and Extension  - 1 x daily - 7 x weekly - 3 sets - 10 reps  ASSESSMENT:   CLINICAL IMPRESSION: Patient had initial f/u since eval today. Mostly everything has been okay other than pain after bowel movement.  Pt has some asymmetry in pelvis that was corrected with MET.  Pt was given hip and pelvic strengthening to address muscle asymmetries.  Pt will benefit from skilled  PT to continue to address impairments and  restore function without pain.       OBJECTIVE IMPAIRMENTS decreased coordination, decreased endurance, decreased ROM, decreased strength, increased muscle spasms, impaired flexibility, impaired tone, postural dysfunction, and pain.    ACTIVITY LIMITATIONS community activity and interpersonal relationships .    PERSONAL FACTORS Time since onset of injury/illness/exacerbation and 1-2 comorbidities: constipation; chronic back pain  are also affecting patient's functional outcome.      REHAB POTENTIAL: Good   CLINICAL DECISION MAKING: Evolving/moderate complexity   EVALUATION COMPLEXITY: Moderate     GOALS: Goals reviewed with patient? Yes   SHORT TERM GOALS: Target date: 06/16/2021   20% reduction of pain Baseline:  Goal status: INITIAL   2.  Pt will be ind with initial HEP Baseline:  Goal status: INITIAL     LONG TERM GOALS: Target date: 08/11/2021   Pt will be independent with advanced HEP to maintain improvements made throughout therapy   Baseline:  Goal status: INITIAL   2.  Pt will report 75% reduction of pain due to improvements in posture, strength, and muscle length   Baseline:  Goal status: INITIAL   3.  Pt will be able to lift at least 10 lb correctly for 10 reps without pain for improved functional activities  Baseline:  Goal status: INITIAL   4.  Pt will have 1/3 or less score of Marinoff scale   Baseline: 2/3 Goal status: INITIAL     PLAN: PT FREQUENCY: 1x/week   PT DURATION: 12 weeks   PLANNED INTERVENTIONS: Therapeutic exercises, Therapeutic activity, Neuromuscular re-education, Balance training, Gait training, Patient/Family education, Joint mobilization, Dry Needling, Electrical stimulation, Cryotherapy, Moist heat, Taping, Biofeedback, and Manual therapy   PLAN FOR NEXT SESSION: f/u on HEP, MET and pelvic obliquity, hip and glute strength with single leg, internal pelvic floor.     Brayton Caves Zahira Brummond, PT 06/22/2021, 3:40 PM   PHYSICAL  THERAPY DISCHARGE SUMMARY  Visits from Start of Care: 2  Current functional level related to goals / functional outcomes:   See above Remaining deficits: See above details   Education / Equipment: HEP   Patient agrees to discharge. Patient goals were not met. Patient is being discharged due to not returning since the last visit.  Russella Dar, PT 09/14/21 11:50 AM

## 2021-06-22 ENCOUNTER — Encounter: Payer: Self-pay | Admitting: Physical Therapy

## 2021-06-22 ENCOUNTER — Ambulatory Visit: Payer: Managed Care, Other (non HMO) | Attending: Family Medicine | Admitting: Physical Therapy

## 2021-06-22 DIAGNOSIS — M6281 Muscle weakness (generalized): Secondary | ICD-10-CM | POA: Diagnosis present

## 2021-06-22 DIAGNOSIS — R279 Unspecified lack of coordination: Secondary | ICD-10-CM | POA: Insufficient documentation

## 2021-06-22 DIAGNOSIS — M62838 Other muscle spasm: Secondary | ICD-10-CM | POA: Insufficient documentation

## 2021-06-22 DIAGNOSIS — M5416 Radiculopathy, lumbar region: Secondary | ICD-10-CM | POA: Diagnosis present

## 2021-06-29 ENCOUNTER — Ambulatory Visit: Payer: BC Managed Care – PPO

## 2021-06-29 ENCOUNTER — Ambulatory Visit
Admission: RE | Admit: 2021-06-29 | Discharge: 2021-06-29 | Disposition: A | Discharge status: Home / Self Care | Payer: Managed Care, Other (non HMO) | Source: Ambulatory Visit | Attending: Obstetrics & Gynecology | Admitting: Obstetrics & Gynecology

## 2021-06-29 DIAGNOSIS — N6011 Diffuse cystic mastopathy of right breast: Secondary | ICD-10-CM

## 2021-06-30 ENCOUNTER — Encounter: Payer: Managed Care, Other (non HMO) | Admitting: Physical Therapy

## 2021-08-10 ENCOUNTER — Ambulatory Visit: Payer: Managed Care, Other (non HMO) | Admitting: Family Medicine

## 2021-09-08 ENCOUNTER — Ambulatory Visit: Payer: Managed Care, Other (non HMO) | Admitting: Sports Medicine

## 2021-09-08 ENCOUNTER — Encounter: Payer: Self-pay | Admitting: Sports Medicine

## 2021-09-08 DIAGNOSIS — K581 Irritable bowel syndrome with constipation: Secondary | ICD-10-CM

## 2021-09-08 MED ORDER — LINACLOTIDE 72 MCG PO CAPS: 72.0000 ug | ORAL_CAPSULE | Freq: Every day | ORAL | 3 refills | Status: DC

## 2021-09-08 NOTE — Assessment & Plan Note (Signed)
This is a very pleasant 61 year old female, she has a long history of intermittent chronic abdominal pain, initially lower pelvic, left lower quadrant, worse with stooling typically presenting with pain on Valsalva. She has been seen by OB/GYN, pelvic floor specialist, as well as gastroenterology. Colonoscopy revealed rectal fissure, she was treated with nitroglycerin with minimal improvement. She did have some pelvic procedures, however which seemed to help. She also had a CT which showed diverticulosis without obvious diverticulitis. More recently she has developed pain left upper quadrant, intermittent. She does endorse constipation chronically, she has been placed on MiraLAX which helped to some degree. Exam is completely benign. I think we are dealing more with an IBS constipation and potentially a splenic flexure syndrome for her left upper quadrant discomfort. We will start Linzess low-dose for a month with dose titrations monthly up to the max if needed. She knows to stop MiraLAX for now. If she continues to have any lower pelvic pain that is sharp or near the anus we will consider rectal diltiazem rather than nitroglycerin.

## 2021-09-08 NOTE — Progress Notes (Signed)
    Procedures performed today:    None.  Independent interpretation of notes and tests performed by another provider:   None.  Brief History, Exam, Impression, and Recommendations:    Irritable bowel syndrome with constipation This is a very pleasant 61 year old female, she has a long history of intermittent chronic abdominal pain, initially lower pelvic, left lower quadrant, worse with stooling typically presenting with pain on Valsalva. She has been seen by OB/GYN, pelvic floor specialist, as well as gastroenterology. Colonoscopy revealed rectal fissure, she was treated with nitroglycerin with minimal improvement. She did have some pelvic procedures, however which seemed to help. She also had a CT which showed diverticulosis without obvious diverticulitis. More recently she has developed pain left upper quadrant, intermittent. She does endorse constipation chronically, she has been placed on MiraLAX which helped to some degree. Exam is completely benign. I think we are dealing more with an IBS constipation and potentially a splenic flexure syndrome for her left upper quadrant discomfort. We will start Linzess low-dose for a month with dose titrations monthly up to the max if needed. She knows to stop MiraLAX for now. If she continues to have any lower pelvic pain that is sharp or near the anus we will consider rectal diltiazem rather than nitroglycerin.    ____________________________________________ Gwen Her. Dianah Field, M.D., ABFM., CAQSM., AME. Primary Care and Sports Medicine [facility]  Adjunct Professor of [place] [employer] [facility] of Medicine  Risk manager

## 2021-10-07 ENCOUNTER — Other Ambulatory Visit: Payer: Self-pay | Admitting: Family Medicine

## 2021-10-07 ENCOUNTER — Ambulatory Visit (INDEPENDENT_AMBULATORY_CARE_PROVIDER_SITE_OTHER): Payer: Managed Care, Other (non HMO) | Admitting: Sports Medicine

## 2021-10-07 ENCOUNTER — Telehealth: Payer: Self-pay

## 2021-10-07 ENCOUNTER — Encounter: Payer: Self-pay | Admitting: Sports Medicine

## 2021-10-07 DIAGNOSIS — R1030 Lower abdominal pain, unspecified: Secondary | ICD-10-CM | POA: Diagnosis not present

## 2021-10-07 DIAGNOSIS — Z1231 Encounter for screening mammogram for malignant neoplasm of breast: Secondary | ICD-10-CM

## 2021-10-07 DIAGNOSIS — K581 Irritable bowel syndrome with constipation: Secondary | ICD-10-CM | POA: Diagnosis not present

## 2021-10-07 MED ORDER — LINACLOTIDE 145 MCG PO CAPS: 145.0000 ug | ORAL_CAPSULE | Freq: Every day | ORAL | 0 refills | Status: DC

## 2021-10-07 NOTE — Addendum Note (Signed)
Addended by: Silverio Decamp on: 10/07/2021 04:03 PM   Modules accepted: Orders

## 2021-10-07 NOTE — Telephone Encounter (Signed)
Patient called back after her appt today. She stated that you discussed taking 2 pills for a week to see if that helps her abdominal discomfort. She would like to do that before trying PT. Please advise.

## 2021-10-07 NOTE — Assessment & Plan Note (Signed)
Pleasant 61 year old female, long history of intermittent chronic abdominal pain, lower abdominal. Typically was worse with stooling and worse on Valsalva. She had been seen by OB/GYN, pelvic floor specialist, gastroenterology, had a diagnostic laparoscopy that was for the most part unrevealing, she had a rectal fissure treated with nitroglycerin, this has however improved and she has no pain with stooling on Linzess.  CT showed some diverticulosis without diverticulitis, she has also had a lumbar spine and pelvic MRI, there were expected lumbar degenerative processes but nothing that would cause anterior/lower pelvic pain just above the pubic symphysis. She does endorse that her pain returns with trying to get up and walk. I have examined her again today, no pain is referable to the hip joints, she has no tenderness over her pubic symphysis or anterior pubic bones, she does have some reproduction of pain with resisted flexion of both hips/doing a crunch. I would like her to do some formal physical therapy with regards to her lower abdominal muscles. If the above does not help then I would like her to return to her PCP as this is unlikely an orthopedic/sports medicine process.

## 2021-10-07 NOTE — Progress Notes (Addendum)
    Procedures performed today:    None.  Independent interpretation of notes and tests performed by another provider:   None.  Brief History, Exam, Impression, and Recommendations:    Irritable bowel syndrome with constipation Pleasant 61 year old female, long history of intermittent chronic abdominal pain, lower abdominal. Typically was worse with stooling and worse on Valsalva. She had been seen by OB/GYN, pelvic floor specialist, gastroenterology, had a diagnostic laparoscopy that was for the most part unrevealing, she had a rectal fissure treated with nitroglycerin, this has however improved and she has no pain with stooling on Linzess. CT showed some diverticulosis without diverticulitis, she has also had a lumbar spine and pelvic MRI, there were expected lumbar degenerative processes but nothing that would cause anterior/lower pelvic pain just above the pubic symphysis. I have examined her again today, no pain is referable to the hip joints, she has no tenderness over her pubic symphysis or anterior pubic bones, she does have some reproduction of pain with resisted flexion of both hips/doing a crunch. I would like her to do some formal physical therapy with regards to her lower abdominal muscles.  Update: Patient calls back and would like to try the higher dose of Linzess.   Lower abdominal pain Pleasant 61 year old female, long history of intermittent chronic abdominal pain, lower abdominal. Typically was worse with stooling and worse on Valsalva. She had been seen by OB/GYN, pelvic floor specialist, gastroenterology, had a diagnostic laparoscopy that was for the most part unrevealing, she had a rectal fissure treated with nitroglycerin, this has however improved and she has no pain with stooling on Linzess.  CT showed some diverticulosis without diverticulitis, she has also had a lumbar spine and pelvic MRI, there were expected lumbar degenerative processes but nothing that would  cause anterior/lower pelvic pain just above the pubic symphysis. She does endorse that her pain returns with trying to get up and walk. I have examined her again today, no pain is referable to the hip joints, she has no tenderness over her pubic symphysis or anterior pubic bones, she does have some reproduction of pain with resisted flexion of both hips/doing a crunch. I would like her to do some formal physical therapy with regards to her lower abdominal muscles. If the above does not help then I would like her to return to her PCP as this is unlikely an orthopedic/sports medicine process.    ____________________________________________ Gwen Her. Dianah Field, M.D., ABFM., CAQSM., AME. Primary Care and Sports Medicine [facility]  Adjunct Professor of [place] [employer] of Medicine  Risk manager

## 2021-10-07 NOTE — Assessment & Plan Note (Addendum)
Pleasant 61 year old female, long history of intermittent chronic abdominal pain, lower abdominal. Typically was worse with stooling and worse on Valsalva. She had been seen by OB/GYN, pelvic floor specialist, gastroenterology, had a diagnostic laparoscopy that was for the most part unrevealing, she had a rectal fissure treated with nitroglycerin, this has however improved and she has no pain with stooling on Linzess. CT showed some diverticulosis without diverticulitis, she has also had a lumbar spine and pelvic MRI, there were expected lumbar degenerative processes but nothing that would cause anterior/lower pelvic pain just above the pubic symphysis. I have examined her again today, no pain is referable to the hip joints, she has no tenderness over her pubic symphysis or anterior pubic bones, she does have some reproduction of pain with resisted flexion of both hips/doing a crunch. I would like her to do some formal physical therapy with regards to her lower abdominal muscles.  Update: Patient calls back and would like to try the higher dose of Linzess.

## 2021-10-07 NOTE — Telephone Encounter (Signed)
Okay I will go ahead and change her Linzess to the higher dose.

## 2021-10-08 NOTE — Telephone Encounter (Signed)
Patient aware higher dose was sent to the pharmacy.

## 2021-10-15 ENCOUNTER — Other Ambulatory Visit: Payer: Self-pay

## 2021-10-15 ENCOUNTER — Encounter: Payer: Self-pay | Admitting: Rehabilitative and Restorative Service Providers"

## 2021-10-15 ENCOUNTER — Ambulatory Visit
Payer: Managed Care, Other (non HMO) | Attending: Sports Medicine | Admitting: Rehabilitative and Restorative Service Providers"

## 2021-10-15 DIAGNOSIS — R1030 Lower abdominal pain, unspecified: Secondary | ICD-10-CM | POA: Diagnosis present

## 2021-10-15 DIAGNOSIS — M62838 Other muscle spasm: Secondary | ICD-10-CM

## 2021-10-15 DIAGNOSIS — M6281 Muscle weakness (generalized): Secondary | ICD-10-CM | POA: Diagnosis present

## 2021-10-15 DIAGNOSIS — R29898 Other symptoms and signs involving the musculoskeletal system: Secondary | ICD-10-CM | POA: Diagnosis present

## 2021-10-15 DIAGNOSIS — R279 Unspecified lack of coordination: Secondary | ICD-10-CM | POA: Diagnosis present

## 2021-10-15 NOTE — Therapy (Signed)
OUTPATIENT PHYSICAL THERAPY THORACOLUMBAR EVALUATION   Patient Name: Stacy Preston MRN: [id number] DOB:August 05, 1960, 61 y.o., female Today's Date: 10/15/2021   PT End of Session - 10/15/21 1617     Visit Number 1    Number of Visits 12    Date for PT Re-Evaluation 12/10/21    PT Start Time 1615    PT Stop Time 1704    PT Time Calculation (min) 49 min    Activity Tolerance Patient tolerated treatment well             Past Medical History:  Diagnosis Date   Allergy    History of basal cell carcinoma 01/09/2016   Hypertension    Infertility, female    Post-operative nausea and vomiting    Prolactinoma (HCC)    Vitamin D deficiency 01/09/2016   Past Surgical History:  Procedure Laterality Date   BREAST BIOPSY Right 12/29/2020   COLONOSCOPY  2013, 2017   [place]   iv[facility]     LAPAROSCOPY     for endometriosis   PLANTAR FASCIA RELEASE     PLANTAR FASCIA SURGERY     Patient Active Problem List   Diagnosis Date Noted   Irritable bowel syndrome with constipation 09/08/2021   Acute medial meniscal tear, sequela 10/07/2020   Pain in right knee 09/01/2020   Lumbar radiculopathy 05/20/2020   Lower abdominal pain 05/20/2020   Knee pain 05/20/2020   Cervical stenosis (uterine cervix) 05/05/2020   Pelvic and perineal pain 04/24/2020   Cervical high risk HPV (human papillomavirus) test positive 01/08/2020   Well adult exam 11/28/2019   Elevated blood-pressure reading without diagnosis of hypertension 12/28/2018   Tinnitus 11/27/2018   GAD (generalized anxiety disorder) 12/21/2016   Atrophic vaginitis 02/27/2016   Vitamin D deficiency 01/09/2016   History of prolactinoma 01/09/2016   History of basal cell carcinoma 01/09/2016   Cervical disc disorder with radiculopathy of cervical region 01/09/2016   Elevated C-reactive protein (CRP) 01/09/2016    PCP: Dr Everrett Coombe  REFERRING PROVIDER: Dr Verdell Carmine DIAG: Lower abdominal pain   Rationale for  Evaluation and Treatment Rehabilitation  THERAPY DIAG:  Lower abdominal pain  ONSET DATE: 02/23/20  SUBJECTIVE:                                                                                                                                                                                           SUBJECTIVE STATEMENT: Patient reports onset of pelvic pain January 2022 with no known injury. She did ride several hours in a bumpy jeep and noticed Lt knee pain, pelvic pain and LBP. She was diagnosed  with compression fx T12 at that time.  She has been evaluated and treated by her gynecologist; orthopedist; and [facility] with treatment including diagnostic tests; ESI 12/22; hysteroscopy, biopsy, D&C, laparoscopy 03/13/21.  Kriste Basque has pain after bowel movements. She does not have pain following intercourse but has experienced this in the past. She has some urinary incontinence when she "really needs to go".    PERTINENT HISTORY:  Miscarriage 6/99; pelvic pain 4/08; hip contusion from fall 1/14; back strain 9/15; fx Lt wrist/sprained Lt ankle 1/16; neck and back pain 6/17; fx Lt 5th toe 8/20; fx with bulging disc/nerve pain 4/22; PT for LBP/knee and LE pain 4/22; pelvic PT 6/22 x 2 visits; torn meniscus Rt knee 6/22 w/ injection; MRI Lt hip labral tear with cyst 6/22; lumbar ESI 12/12; hysteroscopy, biopsy, D&C, laparoscopy 03/13/21  PAIN:  Are you having pain? Yes: NPRS scale: 0/10 at rest; 5/10 with ascending > descending steps; 8-9/following bowel movement/10 Pain location: anterior pelvis - deep Pain description: pinching; pulling Aggravating factors: bowel movements Relieving factors: meds to soften stools helps some   PRECAUTIONS: None  WEIGHT BEARING RESTRICTIONS No  FALLS:  Has patient fallen in last 6 months? 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 Household chores; sedentary   PLOF: Independent  PATIENT GOALS et rid of pain    OBJECTIVE:   PATIENT SURVEYS:  FOTO 45  SCREENING FOR RED FLAGS: Bowel or bladder incontinence: No bowel incontinence; deep pelvic pain after bowel movements; urinary urgency    COGNITION:  Overall cognitive status: Within functional limits for tasks assessed     SENSATION: WFL  MUSCLE LENGTH: Hamstrings: Right 70 deg; Left 75 deg   POSTURE: some forward posture; symmetrical through LB/hips   PALPATION: Tightness and tenderness psoas Rt > Lt; proximal to synthesis pubis; Rt/Lt transverse abdominals; Rt piriformis/glut med  LUMBAR ROM:   Active  A/PROM  eval  Flexion 80%  Extension 15% pinching/pulling in pelvic area   Right lateral flexion 80%  Left lateral flexion 80%  Right rotation 65%  Left rotation 65%   (Blank rows = not tested)  LOWER EXTREMITY ROM:     ROM bilat LE's WFL's   LOWER EXTREMITY MMT:    MMT Right eval Left eval  Hip flexion 5/5 4+/5  Hip extension 4+/5 4+/5  Hip abduction 5/5 5/5  Hip adduction    Hip internal rotation    Hip external rotation    Knee flexion 5/5 5/5  Knee extension 5/5 5/5  Ankle dorsiflexion    Ankle plantarflexion    Ankle inversion    Ankle eversion     (Blank rows = not tested)  LUMBAR SPECIAL TESTS:  Straight leg raise test: Negative and Slump test: Negative  FUNCTIONAL TESTS:  SLS - unable to balance > 2-3 seconds on either LE  GAIT: Distance walked: 40 Assistive device utilized: None Level of assistance: Complete Independence Comments: WNL's    TODAY'S TREATMENT  Prone pressup x 10 2-3 sec hold exhale and sag last 3 reps Glut set prone 10 sec x 10 reps - produced pinching in area of familiar pain which increased in intensity with increased reps Kegel 5-10 sec x 5 - difficulty isolating pelvic floor  muscles    PATIENT  EDUCATION:  Education details: POC; HEP; posture and alignment Person educated: Patient Education method: Explanation, Demonstration, Tactile cues, Verbal cues, and Handouts Education comprehension: verbalized understanding, returned demonstration, verbal cues required, tactile cues required, and needs further education   HOME EXERCISE PROGRAM: Access Code: [email or web address] URL: [email or web address] Date: 10/15/2021 Prepared by: Corlis Leak  Exercises - Prone Press Up  - 2 x daily - 7 x weekly - 1 sets - 10 reps - 2-3 sec  hold - Prone Gluteal Sets  - 2 x daily - 7 x weekly - 1 sets - 10 reps - 10 sec  hold - Supine Transversus Abdominis Bracing with Pelvic Floor Contraction  - 2 x daily - 7 x weekly - 1 sets - 10 reps - 10sec  hold  ASSESSMENT:  CLINICAL IMPRESSION: Patient is a 61 y.o. female who was seen today for physical therapy evaluation and treatment for persistent pain in the pelvis area. She has had pelvic pain since 1/22 with treatments ineffective a eliminating pain. She has noted some improvement in symptoms but has continued deep pelvic pain following bowel movements, ascending > descending steps. She has some limitation in trunk and LE mobility; pain with standing trunk extension; muscular tightness in trunk and hips; pain with prone glut sets. Patient will benefit from PT to address problems identified. She would benefit from pelvic floor specialist evaluation and treatment.    OBJECTIVE IMPAIRMENTS decreased activity tolerance, decreased mobility, difficulty walking, decreased ROM, decreased strength, increased fascial restrictions, increased muscle spasms, impaired flexibility, improper body mechanics, postural dysfunction, and pain.   ACTIVITY LIMITATIONS standing and stairs  PARTICIPATION LIMITATIONS: meal prep, shopping, community activity, and activities that require prolonged standing  PERSONAL FACTORS Time since onset of  injury/illness/exacerbation are also affecting patient's functional outcome.   REHAB POTENTIAL: Good  CLINICAL DECISION MAKING: Stable/uncomplicated  EVALUATION COMPLEXITY: Low   GOALS: Goals reviewed with patient? Yes   LONG TERM GOALS: Target date: 12/10/21  Decrease pelvic pain by 50% with patient to report less pain following bowel movements and ascending/descending stairs  Baseline:  Goal status: INITIAL  2.  Increase core strength and stability allowing patient to stand and walk for functional, community times/distances Baseline:  Goal status: INITIAL  3.  Decreased myofacial pain with palpation through the anterior hips/pelvis and posterior hips - piriformis and gluts  Baseline:  Goal status: INITIAL  4.  Independent in HEP including aquatic program indicated  Baseline:  Goal status: INITIAL  5.  Improve functional limitation score to 63 Baseline:  Goal status: INITIAL     PLAN: PT FREQUENCY: 2x/week  PT DURATION: 8 weeks  PLANNED INTERVENTIONS: Therapeutic exercises, Therapeutic activity, Neuromuscular re-education, Patient/Family education, Self Care, Joint mobilization, Aquatic Therapy, Dry Needling, Electrical stimulation, Cryotherapy, Moist heat, Taping, Ultrasound, Manual therapy, and Re-evaluation.  PLAN FOR NEXT SESSION: Review and progress HEP; modalities and manual work as indicated    [employer], PT, MPH  10/15/2021, 6:09 PM

## 2021-10-20 ENCOUNTER — Ambulatory Visit: Payer: Managed Care, Other (non HMO) | Admitting: Rehabilitative and Restorative Service Providers"

## 2021-10-20 ENCOUNTER — Encounter: Payer: Self-pay | Admitting: Rehabilitative and Restorative Service Providers"

## 2021-10-20 DIAGNOSIS — R1030 Lower abdominal pain, unspecified: Secondary | ICD-10-CM

## 2021-10-20 DIAGNOSIS — M6281 Muscle weakness (generalized): Secondary | ICD-10-CM

## 2021-10-20 DIAGNOSIS — R279 Unspecified lack of coordination: Secondary | ICD-10-CM

## 2021-10-20 DIAGNOSIS — R29898 Other symptoms and signs involving the musculoskeletal system: Secondary | ICD-10-CM

## 2021-10-20 DIAGNOSIS — M62838 Other muscle spasm: Secondary | ICD-10-CM

## 2021-10-20 NOTE — Therapy (Signed)
OUTPATIENT PHYSICAL THERAPY TREATMENT   Patient Name: MAEGEN SENFT MRN: [id number] DOB:Oct 29, 1960, 61 y.o., female Today's Date: 10/20/2021   PT End of Session - 10/20/21 1532     Visit Number 2    Number of Visits 12    Date for PT Re-Evaluation 12/10/21    PT Start Time 1530    PT Stop Time 1619    PT Time Calculation (min) 49 min    Activity Tolerance Patient tolerated treatment well             Past Medical History:  Diagnosis Date   Allergy    History of basal cell carcinoma 01/09/2016   Hypertension    Infertility, female    Post-operative nausea and vomiting    Prolactinoma (HCC)    Vitamin D deficiency 01/09/2016   Past Surgical History:  Procedure Laterality Date   BREAST BIOPSY Right 12/29/2020   COLONOSCOPY  2013, 2017   [place]   ivf     LAPAROSCOPY     for endometriosis   PLANTAR FASCIA RELEASE     PLANTAR FASCIA SURGERY     Patient Active Problem List   Diagnosis Date Noted   Irritable bowel syndrome with constipation 09/08/2021   Acute medial meniscal tear, sequela 10/07/2020   Pain in right knee 09/01/2020   Lumbar radiculopathy 05/20/2020   Lower abdominal pain 05/20/2020   Knee pain 05/20/2020   Cervical stenosis (uterine cervix) 05/05/2020   Pelvic and perineal pain 04/24/2020   Cervical high risk HPV (human papillomavirus) test positive 01/08/2020   Well adult exam 11/28/2019   Elevated blood-pressure reading without diagnosis of hypertension 12/28/2018   Tinnitus 11/27/2018   GAD (generalized anxiety disorder) 12/21/2016   Atrophic vaginitis 02/27/2016   Vitamin D deficiency 01/09/2016   History of prolactinoma 01/09/2016   History of basal cell carcinoma 01/09/2016   Cervical disc disorder with radiculopathy of cervical region 01/09/2016   Elevated C-reactive protein (CRP) 01/09/2016    PCP: Dr Everrett Coombe  REFERRING PROVIDER: Dr Verdell Carmine DIAG: Lower abdominal pain   Rationale for Evaluation and  Treatment Rehabilitation  THERAPY DIAG:  Lower abdominal pain  Muscle weakness (generalized)  Other muscle spasm  Unspecified lack of coordination  Other symptoms and signs involving the musculoskeletal system  ONSET DATE: 02/23/20  SUBJECTIVE:                                                                                                                                                                                           SUBJECTIVE STATEMENT: 10/20/21; Patient reports that she has been working on the exercises and  can tell the muscles are sore and she has some increased pain in buttocks with exercises and with steps. Has been working on exercises    10/15/21: Patient reports onset of pelvic pain January 2022 with no known injury. She did ride several hours in a bumpy jeep and noticed Lt knee pain, pelvic pain and LBP. She was diagnosed with compression fx T12 at that time.  She has been evaluated and treated by her gynecologist; orthopedist; and PPC with treatment including diagnostic tests; ESI 12/22; hysteroscopy, biopsy, D&C, laparoscopy 03/13/21.  Kriste Basque has pain after bowel movements. She does not have pain following intercourse but has experienced this in the past. She has some urinary incontinence when she "really needs to go".    PERTINENT HISTORY:  Miscarriage 6/99; pelvic pain 4/08; hip contusion from fall 1/14; back strain 9/15; fx Lt wrist/sprained Lt ankle 1/16; neck and back pain 6/17; fx Lt 5th toe 8/20; fx with bulging disc/nerve pain 4/22; PT for LBP/knee and LE pain 4/22; pelvic PT 6/22 x 2 visits; torn meniscus Rt knee 6/22 w/ injection; MRI Lt hip labral tear with cyst 6/22; lumbar ESI 12/12; hysteroscopy, biopsy, D&C, laparoscopy 03/13/21  PAIN:  Are you having pain? Yes: NPRS scale: 0/10 at rest; 8/10 with ascending > descending steps; 8-9/following bowel movement/10 Pain location: anterior pelvis - deep Pain description: pinching; pulling Aggravating factors: bowel  movements Relieving factors: meds to soften stools helps some   PRECAUTIONS: None  WEIGHT BEARING RESTRICTIONS No  FALLS:  Has patient fallen in last 6 months? 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 Household chores; sedentary   PLOF: Independent  PATIENT GOALS et rid of pain    OBJECTIVE:   PATIENT SURVEYS:  FOTO 45  SCREENING FOR RED FLAGS: Bowel or bladder incontinence: No bowel incontinence; deep pelvic pain after bowel movements; urinary urgency    COGNITION:  Overall cognitive status: Within functional limits for tasks assessed     SENSATION: WFL  MUSCLE LENGTH: Hamstrings: Right 70 deg; Left 75 deg   POSTURE: some forward posture; symmetrical through LB/hips   PALPATION: Tightness and tenderness psoas Rt > Lt; proximal to synthesis pubis; Rt/Lt transverse abdominals; Rt piriformis/glut med  LUMBAR ROM:   Active  A/PROM  eval  Flexion 80%  Extension 15% pinching/pulling in pelvic area   Right lateral flexion 80%  Left lateral flexion 80%  Right rotation 65%  Left rotation 65%   (Blank rows = not tested)  LOWER EXTREMITY ROM:     ROM bilat LE's WFL's   LOWER EXTREMITY MMT:    MMT Right eval Left eval  Hip flexion 5/5 4+/5  Hip extension 4+/5 4+/5  Hip abduction 5/5 5/5  Hip adduction    Hip internal rotation    Hip external rotation    Knee flexion 5/5 5/5  Knee extension 5/5 5/5  Ankle dorsiflexion    Ankle plantarflexion    Ankle inversion    Ankle eversion     (Blank rows = not tested)  LUMBAR SPECIAL TESTS:  Straight leg raise test: Negative and Slump test: Negative  FUNCTIONAL TESTS:  SLS - unable to balance > 2-3 seconds on either LE  GAIT: Distance walked:  40 Assistive device utilized: None Level of assistance:  Complete Independence Comments: WNL's    TODAY'S TREATMENT  10/20/21:  Nustep L5 x 5 min  Glut sets prone 5 sec x 10 Glut set with over pressure 5 sec x 10 Prone press up Wall squat mini - 10 sec x 10 reps (avoiding pelvic pain)  Deep squat x 3 reps (increased pelvic pain) For home:  Happy baby x 1-2 min Deep squat x 3-5 reps  Supine LE's resting on wall 3-5 min with deep breathing   Manual:  Trigger Point Dry-Needling  Treatment instructions: Expect mild to moderate muscle soreness. S/S of pneumothorax if dry needled over a lung field, and to seek immediate medical attention should they occur. Patient verbalized understanding of these instructions and education.  Patient Consent Given: Yes Education handout provided: Previously provided Muscles treated: piriformis; glut med; glut max Electrical stimulation performed: No Parameters: N/A Treatment response/outcome: decreased palpable tightness     10/20/21: Prone pressup x 10 2-3 sec hold exhale and sag last 3 reps Glut set prone 10 sec x 10 reps - produced pinching in area of familiar pain which increased in intensity with increased reps Kegel 5-10 sec x 5 - difficulty isolating pelvic floor muscles    PATIENT EDUCATION:  Education details: POC; HEP; posture and alignment Person educated: Patient Education method: Explanation, Demonstration, Tactile cues, Verbal cues, and Handouts Education comprehension: verbalized understanding, returned demonstration, verbal cues required, tactile cues required, and needs further education   HOME EXERCISE PROGRAM: Access Code: [email or web address] URL: [email or web address] Date: 10/15/2021 Prepared by: Corlis Leak  Exercises - Prone Press Up  - 2 x daily - 7 x weekly - 1 sets - 10 reps - 2-3 sec  hold - Prone Gluteal Sets  - 2 x daily - 7 x weekly - 1 sets - 10 reps - 10 sec  hold - Supine Transversus Abdominis Bracing  with Pelvic Floor Contraction  - 2 x daily - 7 x weekly - 1 sets - 10 reps - 10sec  hold  ASSESSMENT:  CLINICAL IMPRESSION: 10/20/21:Increased pain response with glut sets in prone and with kegel exercises as well as ascending > descending stairs. Reviewed exercises and progressed with core stabilization. Added wall squat, piriformis stretch. For pelvic floor pain added deep squat (tolerated 3), happy baby and LE resting on wall 2-3 min for home. Trial of DN and manual work Lt piriformis, glut med, glut max. Note less contraction of Lt gluts compared to Rt with contract relax/glut sets Continued discussion of symptoms and plan for treatment. (Research re- symptoms suggest some involvement of levator ani syndrome)    EVAL: Patient is a 61 y.o. female who was seen today for physical therapy evaluation and treatment for persistent pain in the pelvis area. She has had pelvic pain since 1/22 with treatments ineffective a eliminating pain. She has noted some improvement in symptoms but has continued deep pelvic pain following bowel movements, ascending > descending steps. She has some limitation in trunk and LE mobility; pain with standing trunk extension; muscular tightness in trunk and hips; pain with prone glut sets. Patient will benefit from PT to address problems identified. She would benefit from pelvic floor specialist evaluation and treatment.    OBJECTIVE IMPAIRMENTS decreased activity tolerance, decreased mobility, difficulty walking, decreased ROM, decreased strength, increased fascial restrictions, increased muscle spasms, impaired flexibility, improper body mechanics, postural dysfunction, and pain.   ACTIVITY LIMITATIONS standing and stairs  PARTICIPATION LIMITATIONS: meal prep, shopping, community activity, and activities that require prolonged standing  PERSONAL FACTORS  Time since onset of injury/illness/exacerbation are also affecting patient's functional outcome.   REHAB POTENTIAL:  Good  CLINICAL DECISION MAKING: Stable/uncomplicated  EVALUATION COMPLEXITY: Low   GOALS: Goals reviewed with patient? Yes   LONG TERM GOALS: Target date: 12/10/21  Decrease pelvic pain by 50% with patient to report less pain following bowel movements and ascending/descending stairs  Baseline:  Goal status: INITIAL  2.  Increase core strength and stability allowing patient to stand and walk for functional, community times/distances Baseline:  Goal status: INITIAL  3.  Decreased myofacial pain with palpation through the anterior hips/pelvis and posterior hips - piriformis and gluts  Baseline:  Goal status: INITIAL  4.  Independent in HEP including aquatic program indicated  Baseline:  Goal status: INITIAL  5.  Improve functional limitation score to 63 Baseline:  Goal status: INITIAL     PLAN: PT FREQUENCY: 2x/week  PT DURATION: 8 weeks  PLANNED INTERVENTIONS: Therapeutic exercises, Therapeutic activity, Neuromuscular re-education, Patient/Family education, Self Care, Joint mobilization, Aquatic Therapy, Dry Needling, Electrical stimulation, Cryotherapy, Moist heat, Taping, Ultrasound, Manual therapy, and Re-evaluation.  PLAN FOR NEXT SESSION: Review and progress HEP; modalities and manual work as indicated. Assess response to DN and manual work.    Val Riles, PT, MPH  10/20/2021, 5:34 PM

## 2021-10-27 ENCOUNTER — Encounter: Payer: Managed Care, Other (non HMO) | Admitting: Rehabilitative and Restorative Service Providers"

## 2021-10-28 ENCOUNTER — Encounter: Payer: Managed Care, Other (non HMO) | Admitting: Rehabilitative and Restorative Service Providers"

## 2021-10-29 ENCOUNTER — Encounter: Payer: Managed Care, Other (non HMO) | Admitting: Rehabilitative and Restorative Service Providers"

## 2021-11-02 ENCOUNTER — Encounter: Payer: Self-pay | Admitting: Family Medicine

## 2021-11-02 ENCOUNTER — Ambulatory Visit (INDEPENDENT_AMBULATORY_CARE_PROVIDER_SITE_OTHER): Payer: Managed Care, Other (non HMO) | Admitting: Family Medicine

## 2021-11-02 VITALS — BP 120/70 | HR 82 | Ht 63.0 in | Wt 176.0 lb

## 2021-11-02 DIAGNOSIS — Z8249 Family history of ischemic heart disease and other diseases of the circulatory system: Secondary | ICD-10-CM | POA: Diagnosis not present

## 2021-11-02 DIAGNOSIS — M79672 Pain in left foot: Secondary | ICD-10-CM | POA: Diagnosis not present

## 2021-11-02 DIAGNOSIS — I10 Essential (primary) hypertension: Secondary | ICD-10-CM

## 2021-11-02 NOTE — Assessment & Plan Note (Signed)
Tenderness between the first and second digit.  No palpable mass.  Question possible neuroma.  She will avoid flip-flops for a while see if this improves.

## 2021-11-02 NOTE — Assessment & Plan Note (Signed)
She is interested in having coronary calcium score.  This is reasonable given her risk factors as well as her family history of coronary artery disease.  Testing ordered.

## 2021-11-02 NOTE — Progress Notes (Signed)
Stacy Preston - 61 y.o. female MRN [id number]  Date of birth: 1960/11/25  Subjective Chief Complaint  Patient presents with   Labs Only   Foot Pain    HPI Stacy Preston is a 61 y.o. female here today to discuss the following:  Interested in having coronary calcium score chedked.  She has history of HTN that has been well controlled.  There is family history of CAD in several relatives.  She denies anginal symptoms, dyspnea or palpitations.   She is also having some L foot pain.  She has had this for a few weeks. Denies numbness, tingling sensation.    ROS:  A comprehensive ROS was completed and negative except as noted per HPI  Allergies  Allergen Reactions   Lactose Intolerance (Gi)     Past Medical History:  Diagnosis Date   Allergy    History of basal cell carcinoma 01/09/2016   Hypertension    Infertility, female    Post-operative nausea and vomiting    Prolactinoma (HCC)    Vitamin D deficiency 01/09/2016    Past Surgical History:  Procedure Laterality Date   BREAST BIOPSY Right 12/29/2020   COLONOSCOPY  2013, 2017   [place]   iv[place]     LAPAROSCOPY     for endometriosis   PLANTAR FASCIA RELEASE     PLANTAR FASCIA SURGERY      Social History   Socioeconomic History   Marital status: Married    Spouse name: Not on file   Number of children: Not on file   Years of education: Not on file   Highest education level: Not on file  Occupational History   Not on file  Tobacco Use   Smoking status: Never   Smokeless tobacco: Never  Vaping Use   Vaping Use: Never used  Substance and Sexual Activity   Alcohol use: Yes    Alcohol/week: 2.0 standard drinks of alcohol    Types: 2 Standard drinks or equivalent per week   Drug use: No   Sexual activity: Yes    Birth control/protection: None  Other Topics Concern   Not on file  Social History Narrative   Not on file   Social Determinants of Health   Financial Resource Strain: Not on file  Food  Insecurity: Not on file  Transportation Needs: Not on file  Physical Activity: Insufficiently Active (11/23/2017)   Exercise Vital Sign    Days of Exercise per Week: 1 day    Minutes of Exercise per Session: 40 min  Stress: Not on file  Social Connections: Not on file    Family History  Problem Relation Age of Onset   Renal Disease Mother    Diabetes Mother    Hypertension Mother    Cancer Father        Lung CA   Alcohol abuse Brother    Hypertension Brother    Sickle cell anemia Brother    Cancer Brother    Colon polyps Brother    Diabetes Maternal Aunt    Heart disease Maternal Grandmother    Diabetes Maternal Grandmother    Cancer Maternal Grandfather    Diabetes Maternal Grandfather    Colon cancer Maternal Grandfather    Stroke Paternal Grandmother    Heart disease Paternal Grandfather    Esophageal cancer Neg Hx    Rectal cancer Neg Hx    Stomach cancer Neg Hx     Health Maintenance  Topic Date Due   COVID-19  Vaccine (4 - Pfizer risk series) 03/25/2022 (Originally 03/03/2020)   Zoster Vaccines- Shingrix (1 of 2) 03/25/2022 (Originally 09/07/1978)   INFLUENZA VACCINE  05/23/2022 (Originally 09/22/2021)   MAMMOGRAM  12/19/2022   PAP SMEAR-Modifier  12/20/2022   COLONOSCOPY (Pts 45-66yrs Insurance coverage will need to be confirmed)  08/05/2025   TETANUS/TDAP  01/08/2026   Hepatitis C Screening  Completed   HIV Screening  Completed   HPV VACCINES  Aged Out     ----------------------------------------------------------------------------------------------------------------------------------------------------------------------------------------------------------------- Physical Exam BP 120/70 (BP Location: Left Arm, Patient Position: Sitting, Cuff Size: Large)   Pulse 82   Ht 5\' 3"  (1.6 m)   Wt 176 lb (79.8 kg)   SpO2 100%   BMI 31.18 kg/m   Physical  Exam  ------------------------------------------------------------------------------------------------------------------------------------------------------------------------------------------------------------------- Assessment and Plan  Left foot pain Tenderness between the first and second digit.  No palpable mass.  Question possible neuroma.  She will avoid flip-flops for a while see if this improves.  Family history of early CAD She is interested in having coronary calcium score.  This is reasonable given her risk factors as well as her family history of coronary artery disease.  Testing ordered.   No orders of the defined types were placed in this encounter.   No follow-ups on file.    This visit occurred during the SARS-CoV-2 public health emergency.  Safety protocols were in place, including screening questions prior to the visit, additional usage of staff PPE, and extensive cleaning of exam room while observing appropriate contact time as indicated for disinfecting solutions.

## 2021-11-03 ENCOUNTER — Ambulatory Visit (INDEPENDENT_AMBULATORY_CARE_PROVIDER_SITE_OTHER): Payer: Self-pay

## 2021-11-03 DIAGNOSIS — Z8249 Family history of ischemic heart disease and other diseases of the circulatory system: Secondary | ICD-10-CM

## 2021-11-03 DIAGNOSIS — I10 Essential (primary) hypertension: Secondary | ICD-10-CM

## 2021-11-05 ENCOUNTER — Encounter: Payer: Self-pay | Admitting: Family Medicine

## 2021-11-10 ENCOUNTER — Encounter: Payer: Self-pay | Admitting: Family Medicine

## 2021-11-15 ENCOUNTER — Encounter: Payer: Self-pay | Admitting: Family Medicine

## 2021-11-18 ENCOUNTER — Ambulatory Visit: Payer: Managed Care, Other (non HMO) | Admitting: Sports Medicine

## 2021-11-25 ENCOUNTER — Encounter: Payer: Self-pay | Admitting: Family Medicine

## 2021-11-25 ENCOUNTER — Other Ambulatory Visit: Payer: Self-pay | Admitting: Sports Medicine

## 2021-11-25 DIAGNOSIS — K581 Irritable bowel syndrome with constipation: Secondary | ICD-10-CM

## 2021-11-26 ENCOUNTER — Telehealth: Payer: Self-pay | Admitting: Family Medicine

## 2021-11-26 NOTE — Telephone Encounter (Signed)
Pt's husband dropped off paperwork and stated it is requested paperwork through [facility] from PCP. Pt was made aware of turn around time and potential fees for filled out paperwork. Packet left in PCP's box. Stacy Preston

## 2021-11-27 NOTE — Telephone Encounter (Signed)
Packet placed in Dr. Mcneil Sober box.

## 2021-12-02 ENCOUNTER — Encounter: Payer: Self-pay | Admitting: Family Medicine

## 2021-12-04 ENCOUNTER — Encounter: Payer: Self-pay | Admitting: Sports Medicine

## 2021-12-04 ENCOUNTER — Ambulatory Visit (INDEPENDENT_AMBULATORY_CARE_PROVIDER_SITE_OTHER): Payer: Managed Care, Other (non HMO) | Admitting: Sports Medicine

## 2021-12-04 DIAGNOSIS — M5010 Cervical disc disorder with radiculopathy, unspecified cervical region: Secondary | ICD-10-CM

## 2021-12-04 DIAGNOSIS — K581 Irritable bowel syndrome with constipation: Secondary | ICD-10-CM | POA: Diagnosis not present

## 2021-12-04 DIAGNOSIS — R1030 Lower abdominal pain, unspecified: Secondary | ICD-10-CM | POA: Diagnosis not present

## 2021-12-04 MED ORDER — TRIAZOLAM 0.25 MG PO TABS
ORAL_TABLET | ORAL | 0 refills | Status: DC
Start: 2021-12-04 — End: 2022-01-08

## 2021-12-04 NOTE — Assessment & Plan Note (Signed)
Please see prior notes for further details, doing well with low-dose Linzess and MiraLAX, stool habits have improved considerably, she can follow this up with PCP.

## 2021-12-04 NOTE — Assessment & Plan Note (Signed)
Occasional left-sided radicular symptoms with prolonged neck flexion, as in looking at the cell phone in bed. Not bothering her enough to have me treat this.

## 2021-12-04 NOTE — Progress Notes (Signed)
    Procedures performed today:    None.  Independent interpretation of notes and tests performed by another provider:   None.  Brief History, Exam, Impression, and Recommendations:    Irritable bowel syndrome with constipation Please see prior notes for further details, doing well with low-dose Linzess and MiraLAX, stool habits have improved considerably, she can follow this up with PCP.  Cervical disc disorder with radiculopathy of cervical region Occasional left-sided radicular symptoms with prolonged neck flexion, as in looking at the cell phone in bed. Not bothering her enough to have me treat this.  Chronic anterior pelvic pain Jacqlyn Larsen returns, she continues to have chronic anterior pelvic pain that she localizes over the pelvic brim, not reproducible on palpation/nonconcordant pain on palpation. She has had a fairly significant work-up, she has seen OB/GYN, gastroenterology, she had a diagnostic laparoscopy that was unrevealing, historically had a rectal fissure that was treated with nitroglycerin, and has well treated diverticulosis, and IBS-C. We have had her working with pelvic floor physical therapy, they are suggesting that there may be more of a thoracolumbar junction radiculitis. Lumbar spine MRI did infection multilevel lumbar degenerative disc disease, pelvic MRI unrevealing with the exception of bilateral hip osteoarthritis, no T2 signal pelvic rim, pubic bones, or pubic symphysis. Exam continues to be unrevealing today. We did discuss the possibility that we would not find a definitive cause for her abdominal pain/lower pelvic pain, and that we may need to consider empiric treatment with medication such as gabapentin or Lyrica. I do think it is reasonable considering her greater than 6 weeks of symptoms, and exhausting conservative measures to go ahead and get updated MRIs, but this time including her thoracic spine considering potential iliohypogastric and ilioinguinal  nerve involvement which would indicate thoracolumbar stenosis. Ordering the MRIs, adding triazolam for preprocedural anxiolysis, we will hold off on empiric treatment with Lyrica for now, return to see me to go over MRI results.  I spent 30 minutes of total time managing this patient today, this includes chart review, face to face, and non-face to face time.  ____________________________________________ Gwen Her. Dianah Field, M.D., ABFM., CAQSM., AME. Primary Care and Sports Medicine [facility]  Adjunct Professor of [employer]  Risk manager

## 2021-12-04 NOTE — Assessment & Plan Note (Signed)
Stacy Preston returns, she continues to have chronic anterior pelvic pain that she localizes over the pelvic brim, not reproducible on palpation/nonconcordant pain on palpation. She has had a fairly significant work-up, she has seen OB/GYN, gastroenterology, she had a diagnostic laparoscopy that was unrevealing, historically had a rectal fissure that was treated with nitroglycerin, and has well treated diverticulosis, and IBS-C. We have had her working with pelvic floor physical therapy, they are suggesting that there may be more of a thoracolumbar junction radiculitis. Lumbar spine MRI did infection multilevel lumbar degenerative disc disease, pelvic MRI unrevealing with the exception of bilateral hip osteoarthritis, no T2 signal pelvic rim, pubic bones, or pubic symphysis. Exam continues to be unrevealing today. We did discuss the possibility that we would not find a definitive cause for her abdominal pain/lower pelvic pain, and that we may need to consider empiric treatment with medication such as gabapentin or Lyrica. I do think it is reasonable considering her greater than 6 weeks of symptoms, and exhausting conservative measures to go ahead and get updated MRIs, but this time including her thoracic spine considering potential iliohypogastric and ilioinguinal nerve involvement which would indicate thoracolumbar stenosis. Ordering the MRIs, adding triazolam for preprocedural anxiolysis, we will hold off on empiric treatment with Lyrica for now, return to see me to go over MRI results.

## 2021-12-10 ENCOUNTER — Ambulatory Visit: Payer: Managed Care, Other (non HMO)

## 2021-12-10 ENCOUNTER — Ambulatory Visit: Payer: Managed Care, Other (non HMO) | Admitting: Podiatrist

## 2021-12-10 DIAGNOSIS — M84375A Stress fracture, left foot, initial encounter for fracture: Secondary | ICD-10-CM

## 2021-12-10 DIAGNOSIS — M79672 Pain in left foot: Secondary | ICD-10-CM

## 2021-12-11 ENCOUNTER — Encounter: Payer: Self-pay | Admitting: Podiatrist

## 2021-12-11 NOTE — Progress Notes (Signed)
Chief Complaint  Patient presents with   Foot Pain    Left foot pain - on going for months      HPI: Patient is 61 y.o. female who presents today for pain in her left foot, top and bottom near her second toe. She denies any injury to the foot. Relates she has had discomfort in this foot for a couple of months.  She has tried different shoes with no change in symptoms.  She also relates she hit her second toe and it may have broken or jammed the toe itself.    Patient Active Problem List   Diagnosis Date Noted   Left foot pain 11/02/2021   Family history of early CAD 11/02/2021   Irritable bowel syndrome with constipation 09/08/2021   Acute medial meniscal tear, sequela 10/07/2020   Pain in right knee 09/01/2020   Lumbar radiculopathy 05/20/2020   Chronic anterior pelvic pain 05/20/2020   Knee pain 05/20/2020   Cervical stenosis (uterine cervix) 05/05/2020   Pelvic and perineal pain 04/24/2020   Cervical high risk HPV (human papillomavirus) test positive 01/08/2020   Well adult exam 11/28/2019   Elevated blood-pressure reading without diagnosis of hypertension 12/28/2018   Tinnitus 11/27/2018   GAD (generalized anxiety disorder) 12/21/2016   Atrophic vaginitis 02/27/2016   Vitamin D deficiency 01/09/2016   History of prolactinoma 01/09/2016   History of basal cell carcinoma 01/09/2016   Cervical disc disorder with radiculopathy of cervical region 01/09/2016   Elevated C-reactive protein (CRP) 01/09/2016    Current Outpatient Medications on File Prior to Visit  Medication Sig Dispense Refill   ANUCORT-HC 25 MG suppository INSERT 1 SUPPOSITORY RECTALLY TWICE DAILY AS NEEDED FOR HEMORRHOIDS 24 suppository 0   Betamethasone Diprop-Minoxidil 0.05-5 % SOLN Apply topically.     cholecalciferol (VITAMIN D) 1000 UNITS tablet Take 2,000 Units by mouth daily.     estradiol (ESTRACE) 0.1 MG/GM vaginal cream Per vagina three times per week (Patient taking differently: 2 (two) times a week.  Per vagina three times per week) 42.5 g 12   LINZESS 145 MCG CAPS capsule Take 1 capsule by mouth once daily 30 capsule 0   losartan (COZAAR) 25 MG tablet Take 1 tablet (25 mg total) by mouth daily. 90 tablet 3   medium chain triglycerides (MCT OIL) oil Take by mouth 3 (three) times daily.     triamcinolone cream (KENALOG) 0.1 % SMARTSIG:1 Application Topical 2-3 Times Daily     triazolam (HALCION) 0.25 MG tablet 1-2 tabs PO 2 hours before procedure or imaging.  Do not drive with this medication. 2 tablet 0   vitamin C (ASCORBIC ACID) 500 MG tablet Take 500 mg by mouth daily.     No current facility-administered medications on file prior to visit.    Allergies  Allergen Reactions   Lactose Intolerance (Gi)     Review of Systems No fevers, chills, nausea, muscle aches, no difficulty breathing, no calf pain, no chest pain or shortness of breath.   Physical Exam  GENERAL APPEARANCE: Alert, conversant. Appropriately groomed. No acute distress.   VASCULAR: Pedal pulses palpable 2/4 DP and  PT bilateral.  Capillary refill time is immediate to all digits,  Proximal to distal cooling is warm to warm.  Digital perfusion adequate.   NEUROLOGIC: sensation is intact to 5.07 monofilament at 5/5 sites bilateral.  Light touch is intact bilateral, vibratory sensation intact bilateral  MUSCULOSKELETAL: acceptable muscle strength, tone and stability bilateral.  Left second toe is deviated laterally  at the DIPJ.  Slight swelling of the toe note. No significant pain with palpation noted.  Discomfort along the second metatarsal with dorsal to plantar pressure.    DERMATOLOGIC: skin is warm, supple, and dry.  Color, texture, and turgor of skin within normal limits.  No open wounds are noted.  No preulcerative lesions are seen.  Digital nails are asymptomatic.    Xrays:  3 views of the left foot are obtained.  Periosteal reactive changes present on the medial aspect of the second metatarsal- could  represent stress reaction.  No fracture of the toe itself noted. Slight lateral deviation of the toe at the distal interphalangeal joint noted.   Assessment     ICD-10-CM   1. Left foot pain  M79.672 DG Foot Complete Left    2. Stress reaction of left foot, initial encounter  M84.375A        Plan  Discussed exam and xray findings with the patient.  Discussed the pain and changes on xray could represent a stress reaction of this bone-  recommend a darco shoe for the next month- she will return for re-xray and recheck of the foot in 4-6 weeks.

## 2021-12-21 ENCOUNTER — Other Ambulatory Visit: Payer: Managed Care, Other (non HMO)

## 2021-12-22 ENCOUNTER — Ambulatory Visit: Payer: Managed Care, Other (non HMO)

## 2021-12-22 DIAGNOSIS — M549 Dorsalgia, unspecified: Secondary | ICD-10-CM | POA: Diagnosis not present

## 2021-12-22 DIAGNOSIS — R1030 Lower abdominal pain, unspecified: Secondary | ICD-10-CM | POA: Diagnosis not present

## 2021-12-22 DIAGNOSIS — R29818 Other symptoms and signs involving the nervous system: Secondary | ICD-10-CM | POA: Diagnosis not present

## 2021-12-23 ENCOUNTER — Ambulatory Visit
Admission: RE | Admit: 2021-12-23 | Discharge: 2021-12-23 | Disposition: A | Discharge status: Home / Self Care | Payer: Managed Care, Other (non HMO) | Source: Ambulatory Visit | Attending: Family Medicine | Admitting: Family Medicine

## 2021-12-23 DIAGNOSIS — Z1231 Encounter for screening mammogram for malignant neoplasm of breast: Secondary | ICD-10-CM

## 2022-01-06 ENCOUNTER — Other Ambulatory Visit: Payer: Self-pay | Admitting: Family Medicine

## 2022-01-06 DIAGNOSIS — K581 Irritable bowel syndrome with constipation: Secondary | ICD-10-CM

## 2022-01-07 ENCOUNTER — Encounter: Payer: Self-pay | Admitting: Podiatrist

## 2022-01-07 ENCOUNTER — Ambulatory Visit (INDEPENDENT_AMBULATORY_CARE_PROVIDER_SITE_OTHER): Payer: Managed Care, Other (non HMO) | Admitting: Podiatrist

## 2022-01-07 DIAGNOSIS — M84375D Stress fracture, left foot, subsequent encounter for fracture with routine healing: Secondary | ICD-10-CM

## 2022-01-07 NOTE — Progress Notes (Signed)
Chief Complaint  Patient presents with   Foot Pain    Stress fracture patient states her foot is much better now.      HPI: Patient is 61 y.o. female who presents today for follow up of suspected stress reaction left foot.  She relates her foot is improved and she wore the darco shoe up until late last week. She is able to wear her normal shoes now and relates no pain.  She also states the left foot is a little puffy on the bottom in comparison with the right.     Allergies  Allergen Reactions   Lactose Intolerance (Gi)     Review of systems is negative except as noted in the HPI.  Denies nausea/ vomiting/ fevers/ chills or night sweats.   Denies difficulty breathing, denies calf pain or tenderness  Physical Exam  Patient is awake, alert, and oriented x 3.  In no acute distress.    Vascular status is intact with palpable pedal pulses DP and PT bilateral and capillary refill time less than 3 seconds bilateral.  No edema or erythema noted.   Neurological exam reveals epicritic and protective sensation grossly intact bilateral.   Dermatological exam reveals skin is supple and dry to bilateral feet.  No open lesions present.    Musculoskeletal exam:decreased pain at the forefoot of the left. No pain with palpation of the second or third metatarsals.  Slight puffiness left submetatarsal 2-4 vs right.  No evidence of neuroma.   No xrays were taken today as she has improved and they are not warranted.   Assessment:   ICD-10-CM   1. Stress reaction of left foot with routine healing, subsequent encounter  M84.375D        Plan: Discussed exam findings and her improvement with wearing the darco shoe. Discussed she likely had a new stress reaction forming and wearing the shoe like she did allowed it to heal.  Recommended she continue use of her normal shoes and if the pain returns, she can go back in the [place].  Also discussed the puffiness will resolve on its own over the next several  weeks.  She will call in the future to be seen if needed.

## 2022-01-08 ENCOUNTER — Ambulatory Visit (INDEPENDENT_AMBULATORY_CARE_PROVIDER_SITE_OTHER): Payer: Managed Care, Other (non HMO) | Admitting: Family Medicine

## 2022-01-08 ENCOUNTER — Encounter: Payer: Self-pay | Admitting: Family Medicine

## 2022-01-08 VITALS — BP 127/74 | HR 70 | Ht 63.0 in | Wt 176.0 lb

## 2022-01-08 DIAGNOSIS — K581 Irritable bowel syndrome with constipation: Secondary | ICD-10-CM

## 2022-01-08 DIAGNOSIS — Z23 Encounter for immunization: Secondary | ICD-10-CM

## 2022-01-08 DIAGNOSIS — Z13220 Encounter for screening for lipoid disorders: Secondary | ICD-10-CM

## 2022-01-08 DIAGNOSIS — E559 Vitamin D deficiency, unspecified: Secondary | ICD-10-CM | POA: Diagnosis not present

## 2022-01-08 DIAGNOSIS — Z0000 Encounter for general adult medical examination without abnormal findings: Secondary | ICD-10-CM

## 2022-01-08 MED ORDER — LINACLOTIDE 145 MCG PO CAPS: 145.0000 ug | ORAL_CAPSULE | Freq: Every day | ORAL | 3 refills | Status: DC

## 2022-01-08 MED ORDER — LOSARTAN POTASSIUM 25 MG PO TABS: 25.0000 mg | ORAL_TABLET | Freq: Every day | ORAL | 3 refills | Status: DC

## 2022-01-08 NOTE — Patient Instructions (Signed)
Preventive Care 62-62 Years Old, Female Preventive care refers to lifestyle choices and visits with your health care provider that can promote health and wellness. Preventive care visits are also called wellness exams. What can I expect for my preventive care visit? Counseling Your health care provider may ask you questions about your: Medical history, including: Past medical problems. Family medical history. Pregnancy history. Current health, including: Menstrual cycle. Method of birth control. Emotional well-being. Home life and relationship well-being. Sexual activity and sexual health. Lifestyle, including: Alcohol, nicotine or tobacco, and drug use. Access to firearms. Diet, exercise, and sleep habits. Work and work environment. Sunscreen use. Safety issues such as seatbelt and bike helmet use. Physical exam Your health care provider will check your: Height and weight. These may be used to calculate your BMI (body mass index). BMI is a measurement that tells if you are at a healthy weight. Waist circumference. This measures the distance around your waistline. This measurement also tells if you are at a healthy weight and may help predict your risk of certain diseases, such as type 2 diabetes and high blood pressure. Heart rate and blood pressure. Body temperature. Skin for abnormal spots. What immunizations do I need?  Vaccines are usually given at various ages, according to a schedule. Your health care provider will recommend vaccines for you based on your age, medical history, and lifestyle or other factors, such as travel or where you work. What tests do I need? Screening Your health care provider may recommend screening tests for certain conditions. This may include: Lipid and cholesterol levels. Diabetes screening. This is done by checking your blood sugar (glucose) after you have not eaten for a while (fasting). Pelvic exam and Pap test. Hepatitis B test. Hepatitis C  test. HIV (human immunodeficiency virus) test. STI (sexually transmitted infection) testing, if you are at risk. Lung cancer screening. Colorectal cancer screening. Mammogram. Talk with your health care provider about when you should start having regular mammograms. This may depend on whether you have a family history of breast cancer. BRCA-related cancer screening. This may be done if you have a family history of breast, ovarian, tubal, or peritoneal cancers. Bone density scan. This is done to screen for osteoporosis. Talk with your health care provider about your test results, treatment options, and if necessary, the need for more tests. Follow these instructions at home: Eating and drinking  Eat a diet that includes fresh fruits and vegetables, whole grains, lean protein, and low-fat dairy products. Take vitamin and mineral supplements as recommended by your health care provider. Do not drink alcohol if: Your health care provider tells you not to drink. You are pregnant, may be pregnant, or are planning to become pregnant. If you drink alcohol: Limit how much you have to 0-1 drink a day. Know how much alcohol is in your drink. In the [place] one drink equals one 12 oz bottle of beer (355 mL), one 5 oz glass of wine (148 mL), or one 1 oz glass of hard liquor (44 mL). Lifestyle Brush your teeth every morning and night with fluoride toothpaste. Floss one time each day. Exercise for at least 30 minutes 5 or more days each week. Do not use any products that contain nicotine or tobacco. These products include cigarettes, chewing tobacco, and vaping devices, such as e-cigarettes. If you need help quitting, ask your health care provider. Do not use drugs. If you are sexually active, practice safe sex. Use a condom or other form of protection to   prevent STIs. If you do not wish to become pregnant, use a form of birth control. If you plan to become pregnant, see your health care provider for a  prepregnancy visit. Take aspirin only as told by your health care provider. Make sure that you understand how much to take and what form to take. Work with your health care provider to find out whether it is safe and beneficial for you to take aspirin daily. Find healthy ways to manage stress, such as: Meditation, yoga, or listening to music. Journaling. Talking to a trusted person. Spending time with friends and family. Minimize exposure to UV radiation to reduce your risk of skin cancer. Safety Always wear your seat belt while driving or riding in a vehicle. Do not drive: If you have been drinking alcohol. Do not ride with someone who has been drinking. When you are tired or distracted. While texting. If you have been using any mind-altering substances or drugs. Wear a helmet and other protective equipment during sports activities. If you have firearms in your house, make sure you follow all gun safety procedures. Seek help if you have been physically or sexually abused. What's next? Visit your health care provider once a year for an annual wellness visit. Ask your health care provider how often you should have your eyes and teeth checked. Stay up to date on all vaccines. This information is not intended to replace advice given to you by your health care provider. Make sure you discuss any questions you have with your health care provider. Document Revised: 08/06/2020 Document Reviewed: 08/06/2020 Elsevier Patient Education  [place].

## 2022-01-08 NOTE — Progress Notes (Signed)
Stacy Preston - 61 y.o. female MRN [id number]  Date of birth: 09-27-1960  Subjective Chief Complaint  Patient presents with   Annual Exam    HPI Stacy Preston is a 61 y.o. female here today for annual exam.   She reports that she is doing well.   She does try to stay moderately active.  She feels that her diet is pretty good.    She is a non-smoker.  Occasional EtOH use.   She is due for shingles and flu vaccine.    Pap, mammogram and colonoscopy are up to date.    Review of Systems  Constitutional:  Negative for chills, fever, malaise/fatigue and weight loss.  HENT:  Negative for congestion, ear pain and sore throat.   Eyes:  Negative for blurred vision, double vision and pain.  Respiratory:  Negative for cough and shortness of breath.   Cardiovascular:  Negative for chest pain and palpitations.  Gastrointestinal:  Negative for abdominal pain, blood in stool, constipation, heartburn and nausea.  Genitourinary:  Negative for dysuria and urgency.  Musculoskeletal:  Negative for joint pain and myalgias.  Neurological:  Negative for dizziness and headaches.  Endo/Heme/Allergies:  Does not bruise/bleed easily.  Psychiatric/Behavioral:  Negative for depression. The patient is not nervous/anxious and does not have insomnia.     Allergies  Allergen Reactions   Lactose Intolerance (Gi)     Past Medical History:  Diagnosis Date   Allergy    History of basal cell carcinoma 01/09/2016   Hypertension    Infertility, female    Post-operative nausea and vomiting    Prolactinoma (HCC)    Vitamin D deficiency 01/09/2016    Past Surgical History:  Procedure Laterality Date   BREAST BIOPSY Right 12/29/2020   COLONOSCOPY  2013, 2017   [place]   ivf     LAPAROSCOPY     for endometriosis   PLANTAR FASCIA RELEASE     PLANTAR FASCIA SURGERY      Social History   Socioeconomic History   Marital status: Married    Spouse name: Not on file   Number of children:  Not on file   Years of education: Not on file   Highest education level: Not on file  Occupational History   Not on file  Tobacco Use   Smoking status: Never   Smokeless tobacco: Never  Vaping Use   Vaping Use: Never used  Substance and Sexual Activity   Alcohol use: Yes    Alcohol/week: 2.0 standard drinks of alcohol    Types: 2 Standard drinks or equivalent per week   Drug use: No   Sexual activity: Yes    Birth control/protection: None  Other Topics Concern   Not on file  Social History Narrative   Not on file   Social Determinants of Health   Financial Resource Strain: Not on file  Food Insecurity: Not on file  Transportation Needs: Not on file  Physical Activity: Insufficiently Active (11/23/2017)   Exercise Vital Sign    Days of Exercise per Week: 1 day    Minutes of Exercise per Session: 40 min  Stress: Not on file  Social Connections: Not on file    Family History  Problem Relation Age of Onset   Renal Disease Mother    Diabetes Mother    Hypertension Mother    Cancer Father        Lung CA   Alcohol abuse Brother    Hypertension Brother  Sickle cell anemia Brother    Cancer Brother    Colon polyps Brother    Diabetes Maternal Aunt    Heart disease Maternal Grandmother    Diabetes Maternal Grandmother    Cancer Maternal Grandfather    Diabetes Maternal Grandfather    Colon cancer Maternal Grandfather    Stroke Paternal Grandmother    Heart disease Paternal Grandfather    Esophageal cancer Neg Hx    Rectal cancer Neg Hx    Stomach cancer Neg Hx     Health Maintenance  Topic Date Due   COVID-19 Vaccine (4 - Pfizer risk series) 03/25/2022 (Originally 03/03/2020)   Zoster Vaccines- Shingrix (1 of 2) 03/25/2022 (Originally 09/07/1978)   INFLUENZA VACCINE  05/23/2022 (Originally 09/22/2021)   PAP SMEAR-Modifier  12/20/2022   MAMMOGRAM  12/24/2023   COLONOSCOPY (Pts 45-28yrs Insurance coverage will need to be confirmed)  08/05/2025   Hepatitis C  Screening  Completed   HIV Screening  Completed   HPV VACCINES  Aged Out     ----------------------------------------------------------------------------------------------------------------------------------------------------------------------------------------------------------------- Physical Exam BP 127/74 (BP Location: Left Arm, Patient Position: Sitting, Cuff Size: Normal)   Pulse 70   Ht 5\' 3"  (1.6 m)   Wt 176 lb 0.6 oz (79.9 kg)   SpO2 99%   BMI 31.18 kg/m   Physical Exam Constitutional:      General: She is not in acute distress. HENT:     Head: Normocephalic and atraumatic.     Right Ear: Tympanic membrane and ear canal normal.     Left Ear: Tympanic membrane and ear canal normal.     Nose: Nose normal.  Eyes:     General: No scleral icterus.    Conjunctiva/sclera: Conjunctivae normal.  Neck:     Thyroid: No thyromegaly.  Cardiovascular:     Rate and Rhythm: Normal rate and regular rhythm.     Heart sounds: Normal heart sounds.  Pulmonary:     Effort: Pulmonary effort is normal.     Breath sounds: Normal breath sounds.  Abdominal:     General: Bowel sounds are normal. There is no distension.     Palpations: Abdomen is soft.     Tenderness: There is no abdominal tenderness. There is no guarding.  Musculoskeletal:        General: Normal range of motion.     Cervical back: Normal range of motion and neck supple.  Lymphadenopathy:     Cervical: No cervical adenopathy.  Skin:    General: Skin is warm and dry.     Findings: No rash.  Neurological:     General: No focal deficit present.     Mental Status: She is alert and oriented to person, place, and time.     Cranial Nerves: No cranial nerve deficit.     Coordination: Coordination normal.  Psychiatric:        Mood and Affect: Mood normal.        Behavior: Behavior normal.      ------------------------------------------------------------------------------------------------------------------------------------------------------------------------------------------------------------------- Assessment and Plan  Well adult exam Well adult Orders Placed This Encounter  Procedures   COMPLETE METABOLIC PANEL WITH GFR   CBC with Differential   Lipid Panel w/reflex Direct LDL   TSH   Vitamin D (25 hydroxy)  Screenings: UTD Immunizations: Flu vaccine given.  Shingrix declined today.  Anticipatory guidance/Risk factor reduction:  Recommendations per AVS.     No orders of the defined types were placed in this encounter.   No follow-ups on file.  This visit occurred during the SARS-CoV-2 public health emergency.  Safety protocols were in place, including screening questions prior to the visit, additional usage of staff PPE, and extensive cleaning of exam room while observing appropriate contact time as indicated for disinfecting solutions.

## 2022-01-08 NOTE — Addendum Note (Signed)
Addended by: Perlie Mayo on: 01/08/2022 10:41 AM   Modules accepted: Orders

## 2022-01-08 NOTE — Assessment & Plan Note (Signed)
Well adult Orders Placed This Encounter  Procedures   COMPLETE METABOLIC PANEL WITH GFR   CBC with Differential   Lipid Panel w/reflex Direct LDL   TSH   Vitamin D (25 hydroxy)  Screenings: UTD Immunizations: Flu vaccine given.  Shingrix declined today.  Anticipatory guidance/Risk factor reduction:  Recommendations per AVS.

## 2022-01-09 LAB — TSH: TSH: 1.28 mIU/L (ref 0.40–4.50)

## 2022-01-09 LAB — COMPLETE METABOLIC PANEL WITH GFR
AG Ratio: 1.4 (calc) (ref 1.0–2.5)
ALT: 21 U/L (ref 6–29)
AST: 22 U/L (ref 10–35)
Albumin: 4.2 g/dL (ref 3.6–5.1)
Alkaline phosphatase (APISO): 68 U/L (ref 37–153)
BUN: 17 mg/dL (ref 7–25)
CO2: 30 mmol/L (ref 20–32)
Calcium: 9.2 mg/dL (ref 8.6–10.4)
Chloride: 106 mmol/L (ref 98–110)
Creat: 0.72 mg/dL (ref 0.50–1.05)
Globulin: 3 g/dL (calc) (ref 1.9–3.7)
Glucose, Bld: 96 mg/dL (ref 65–99)
Potassium: 4.6 mmol/L (ref 3.5–5.3)
Sodium: 141 mmol/L (ref 135–146)
Total Bilirubin: 0.6 mg/dL (ref 0.2–1.2)
Total Protein: 7.2 g/dL (ref 6.1–8.1)
eGFR: 94 mL/min/{1.73_m2} (ref >=60)

## 2022-01-09 LAB — LIPID PANEL W/REFLEX DIRECT LDL
Cholesterol: 170 mg/dL (ref <200)
HDL: 74 mg/dL (ref >=50)
LDL Cholesterol (Calc): 83 mg/dL (calc)
Non-HDL Cholesterol (Calc): 96 mg/dL (calc) (ref <130)
Total CHOL/HDL Ratio: 2.3 (calc) (ref <5.0)
Triglycerides: 58 mg/dL (ref <150)

## 2022-01-09 LAB — CBC WITH DIFFERENTIAL/PLATELET
Absolute Monocytes: 592 cells/uL (ref 200–950)
Basophils Absolute: 82 cells/uL (ref 0–200)
Basophils Relative: 1.2 %
Eosinophils Absolute: 218 cells/uL (ref 15–500)
Eosinophils Relative: 3.2 %
HCT: 40.2 % (ref 35.0–45.0)
Hemoglobin: 13.2 g/dL (ref 11.7–15.5)
Lymphs Abs: 2203 cells/uL (ref 850–3900)
MCH: 30 pg (ref 27.0–33.0)
MCHC: 32.8 g/dL (ref 32.0–36.0)
MCV: 91.4 fL (ref 80.0–100.0)
MPV: 10 fL (ref 7.5–12.5)
Monocytes Relative: 8.7 %
Neutro Abs: 3706 cells/uL (ref 1500–7800)
Neutrophils Relative %: 54.5 %
Platelets: 278 10*3/uL (ref 140–400)
RBC: 4.4 10*6/uL (ref 3.80–5.10)
RDW: 12.2 % (ref 11.0–15.0)
Total Lymphocyte: 32.4 %
WBC: 6.8 10*3/uL (ref 3.8–10.8)

## 2022-01-09 LAB — VITAMIN D 25 HYDROXY (VIT D DEFICIENCY, FRACTURES): Vit D, 25-Hydroxy: 47 ng/mL (ref 30–100)

## 2022-01-11 ENCOUNTER — Encounter: Payer: Self-pay | Admitting: Family Medicine

## 2022-01-20 ENCOUNTER — Other Ambulatory Visit: Payer: Self-pay | Admitting: Family Medicine

## 2022-02-01 ENCOUNTER — Encounter: Payer: Self-pay | Admitting: Family Medicine

## 2022-02-12 ENCOUNTER — Other Ambulatory Visit: Payer: Self-pay | Admitting: Sports Medicine

## 2022-02-12 DIAGNOSIS — K581 Irritable bowel syndrome with constipation: Secondary | ICD-10-CM

## 2022-02-15 ENCOUNTER — Other Ambulatory Visit: Payer: Self-pay | Admitting: Sports Medicine

## 2022-02-15 DIAGNOSIS — K581 Irritable bowel syndrome with constipation: Secondary | ICD-10-CM

## 2022-03-03 ENCOUNTER — Encounter: Payer: Self-pay | Admitting: Family Medicine

## 2022-03-04 ENCOUNTER — Encounter: Payer: Self-pay | Admitting: Family Medicine

## 2022-03-04 ENCOUNTER — Ambulatory Visit: Payer: Managed Care, Other (non HMO) | Admitting: Family Medicine

## 2022-03-04 VITALS — BP 148/71 | HR 94 | Temp 98.3°F | Ht 63.0 in | Wt 179.0 lb

## 2022-03-04 DIAGNOSIS — J101 Influenza due to other identified influenza virus with other respiratory manifestations: Secondary | ICD-10-CM | POA: Insufficient documentation

## 2022-03-04 DIAGNOSIS — Z20822 Contact with and (suspected) exposure to covid-19: Secondary | ICD-10-CM | POA: Diagnosis not present

## 2022-03-04 DIAGNOSIS — R0989 Other specified symptoms and signs involving the circulatory and respiratory systems: Secondary | ICD-10-CM | POA: Diagnosis not present

## 2022-03-04 LAB — POC COVID19 BINAXNOW: SARS Coronavirus 2 Ag: NEGATIVE

## 2022-03-04 LAB — POCT INFLUENZA A/B
Influenza A, POC: NEGATIVE
Influenza B, POC: POSITIVE — AB

## 2022-03-04 NOTE — Assessment & Plan Note (Signed)
Rapid testing in the clinic is positive for influenza B.  COVID-negative.  Unfortunately she is out of the window for Tamiflu to be very effective.  Recommend continued supportive care with increased fluid intake.  She may utilize over-the-counter medications as needed for symptomatic control.  Contact clinic if having new or worsening symptoms.

## 2022-03-04 NOTE — Patient Instructions (Signed)

## 2022-03-04 NOTE — Progress Notes (Signed)
Stacy Preston - 62 y.o. female MRN [id number]  Date of birth: Apr 14, 1960  Subjective Chief Complaint  Patient presents with   Chills   Generalized Body Aches   Cough   Ear Fullness    HPI Stacy Preston is a 62 year old female here today with complaint of chills, body aches, cough and ear fullness.  She reports symptoms started about 4 days ago.  She has been using over-the-counter medications which provided slight relief.  She is staying well-hydrated.  She has not tested for COVID.  She denies any difficulty breathing, wheezing or GI symptoms.   ROS:  A comprehensive ROS was completed and negative except as noted per HPI  Allergies  Allergen Reactions   Lactose Intolerance (Gi)     Past Medical History:  Diagnosis Date   Allergy    History of basal cell carcinoma 01/09/2016   Hypertension    Infertility, female    Post-operative nausea and vomiting    Prolactinoma (HCC)    Vitamin D deficiency 01/09/2016    Past Surgical History:  Procedure Laterality Date   BREAST BIOPSY Right 12/29/2020   COLONOSCOPY  2013, 2017   [place]   ivf     LAPAROSCOPY     for endometriosis   PLANTAR FASCIA RELEASE     PLANTAR FASCIA SURGERY      Social History   Socioeconomic History   Marital status: Married    Spouse name: Not on file   Number of children: Not on file   Years of education: Not on file   Highest education level: Not on file  Occupational History   Not on file  Tobacco Use   Smoking status: Never   Smokeless tobacco: Never  Vaping Use   Vaping Use: Never used  Substance and Sexual Activity   Alcohol use: Yes    Alcohol/week: 2.0 standard drinks of alcohol    Types: 2 Standard drinks or equivalent per week   Drug use: No   Sexual activity: Yes    Birth control/protection: None  Other Topics Concern   Not on file  Social History Narrative   Not on file   Social Determinants of Health   Financial Resource Strain: Not on file  Food  Insecurity: Not on file  Transportation Needs: Not on file  Physical Activity: Insufficiently Active (11/23/2017)   Exercise Vital Sign    Days of Exercise per Week: 1 day    Minutes of Exercise per Session: 40 min  Stress: Not on file  Social Connections: Not on file    Family History  Problem Relation Age of Onset   Renal Disease Mother    Diabetes Mother    Hypertension Mother    Cancer Father        Lung CA   Alcohol abuse Brother    Hypertension Brother    Sickle cell anemia Brother    Cancer Brother    Colon polyps Brother    Diabetes Maternal Aunt    Heart disease Maternal Grandmother    Diabetes Maternal Grandmother    Cancer Maternal Grandfather    Diabetes Maternal Grandfather    Colon cancer Maternal Grandfather    Stroke Paternal Grandmother    Heart disease Paternal Grandfather    Esophageal cancer Neg Hx    Rectal cancer Neg Hx    Stomach cancer Neg Hx     Health Maintenance  Topic Date Due   COVID-19 Vaccine (4 - 2023-24 season) 03/25/2022 (Originally  10/23/2021)   Zoster Vaccines- Shingrix (1 of 2) 03/25/2022 (Originally 09/07/1978)   PAP SMEAR-Modifier  12/20/2022   MAMMOGRAM  12/24/2023   COLONOSCOPY (Pts 45-72yrs Insurance coverage will need to be confirmed)  08/05/2025   DTaP/Tdap/Td (2 - Td or Tdap) 01/08/2026   INFLUENZA VACCINE  Completed   Hepatitis C Screening  Completed   HIV Screening  Completed   HPV VACCINES  Aged Out     ----------------------------------------------------------------------------------------------------------------------------------------------------------------------------------------------------------------- Physical Exam BP (!) 148/71 (BP Location: Right Arm, Patient Position: Sitting, Cuff Size: Normal)   Pulse 94   Temp 98.3 F (36.8 C) (Oral)   Ht 5\' 3"  (1.6 m)   Wt 179 lb (81.2 kg)   SpO2 99%   BMI 31.71 kg/m   Physical Exam Constitutional:      Appearance: Normal appearance.  HENT:     Head:  Normocephalic and atraumatic.  Eyes:     General: No scleral icterus. Cardiovascular:     Rate and Rhythm: Normal rate and regular rhythm.  Pulmonary:     Effort: Pulmonary effort is normal.     Breath sounds: Normal breath sounds.  Musculoskeletal:     Cervical back: Neck supple.  Neurological:     Mental Status: She is alert.  Psychiatric:        Mood and Affect: Mood normal.        Behavior: Behavior normal.     ------------------------------------------------------------------------------------------------------------------------------------------------------------------------------------------------------------------- Assessment and Plan  Influenza B Rapid testing in the clinic is positive for influenza B.  COVID-negative.  Unfortunately she is out of the window for Tamiflu to be very effective.  Recommend continued supportive care with increased fluid intake.  She may utilize over-the-counter medications as needed for symptomatic control.  Contact clinic if having new or worsening symptoms.   No orders of the defined types were placed in this encounter.   No follow-ups on file.    This visit occurred during the SARS-CoV-2 public health emergency.  Safety protocols were in place, including screening questions prior to the visit, additional usage of staff PPE, and extensive cleaning of exam room while observing appropriate contact time as indicated for disinfecting solutions.  With help of walker, currently

## 2022-03-06 ENCOUNTER — Encounter: Payer: Self-pay | Admitting: Family Medicine

## 2022-03-08 ENCOUNTER — Other Ambulatory Visit: Payer: Self-pay | Admitting: Family Medicine

## 2022-03-08 MED ORDER — POLYMYXIN B-TRIMETHOPRIM 10000-0.1 UNIT/ML-% OP SOLN: 1.0000 [drp] | OPHTHALMIC | 0 refills | Status: DC

## 2022-03-10 ENCOUNTER — Encounter: Payer: Self-pay | Admitting: Family Medicine

## 2022-03-10 MED ORDER — BENZONATATE 200 MG PO CAPS: 200.0000 mg | ORAL_CAPSULE | Freq: Two times a day (BID) | ORAL | 0 refills | Status: DC | PRN

## 2022-03-10 MED ORDER — HYDROCODONE BIT-HOMATROP MBR 5-1.5 MG/5ML PO SOLN: 5.0000 mL | Freq: Three times a day (TID) | ORAL | 0 refills | Status: DC | PRN

## 2022-03-11 ENCOUNTER — Encounter: Payer: Self-pay | Admitting: Gastroenterology

## 2022-03-11 ENCOUNTER — Ambulatory Visit: Payer: Managed Care, Other (non HMO) | Admitting: Gastroenterology

## 2022-03-11 VITALS — BP 122/68 | HR 79 | Ht 63.0 in | Wt 177.0 lb

## 2022-03-11 DIAGNOSIS — Z86010 Personal history of colonic polyps: Secondary | ICD-10-CM | POA: Diagnosis not present

## 2022-03-11 DIAGNOSIS — K5909 Other constipation: Secondary | ICD-10-CM | POA: Diagnosis not present

## 2022-03-11 DIAGNOSIS — R102 Pelvic and perineal pain: Secondary | ICD-10-CM | POA: Diagnosis not present

## 2022-03-11 MED ORDER — LINACLOTIDE 290 MCG PO CAPS: 290.0000 ug | ORAL_CAPSULE | Freq: Every day | ORAL | 1 refills | Status: DC

## 2022-03-11 NOTE — Progress Notes (Signed)
Chief Complaint:    Pelvic pain  GI History: 62 year old female with a history of BCC, HTN, vitamin D deficiency. Family history of colon cancer in her maternal grandfather.  History of chronic pelvic pain which started 02/2020 following car ride to [place] with subsequent work-up as below. Pelvic pain tends to be after BM, intercourse.  Does have associated constipation and decreased stool frequency.  - 03/15/2020: Diagnosed with T12 compression fracture by x-ray  -05/15/2020: CT A/P: Hepatic steatosis, scattered hepatic cysts the largest measuring 2.4 cm.  Diverticulosis without diverticulitis, otherwise normal GI tract. T12 compression fx - 05/24/2020: MRI L-spine: Multilevel spondylosis, mild foraminal narrowing bilaterally at L2-3 secondary to disc bulging and facet hypertrophy.  Disc extrusion at L3-4 with some encroachment on central canal.  Foraminal narrowing L3-4, mild and moderate left foraminal narrowing at L4-5, moderate right and mild left foraminal stenosis at L5-S1. - 07/2020 pelvic floor PT for back pain. Went to a couple sessions, but stopped when she tore her meniscus. Back pain has resolved. - 07/2020: Colonoscopy with 2 tubular adenomas.  Repeat 7 years - 01/2021: Lumbar epidural injection on left side - 02/2021: Hysteroscopy, biopsy, D&C, laparoscopy. Studies were negative and no endometriosis and suspected pelvic pain due to pelvic floor muscle spasm and recommended pelvic PT - 11/2021: MRI T-spine/L-spine with mild degenerative changes without high-grade stenosis  -Colonoscopy 2005 unremarkable per patient -Colonoscopy 09/2011 in [place] notable for polyp (pathology unknown) -Colonoscopy in 2017 at [facility] normal.  Recommended repeat in 5 years due to prior history of polyps - Colonoscopy 07/2020: 3 small polyps (path: TA x2), 8 mm sigmoid polyp (path: HP), 3 mm rectal polyp (path: HP).  Internal hemorrhoids.  Repeat in 7 years  HPI:     Patient is a 62 y.o. female  presenting to the Gastroenterology Clinic for evaluation of lower abdominal/pelvic pain.  Last seen by me on 04/29/2021 for constipation and pelvic pain as outlined above.  Was diagnosed with anal fissure and treated with topical NTG along with fiber supplement and MiraLAX for treatment of constipation.  Anal fissure symptoms resolved with NTG.  She has since f/u with GYN, Sports Med, PCM, Pelvic PT and negative w/u. Underwent MRI T/L spine in October.   Linzess 145 mcg/day and Miralax daily has improved constipation, and overall improvement in pain. Now looser, soft stools. No dyschezia. Pelvic pain tends to be worse with constipation, but now essentially no longer having constipation but still has episodic pelvic pain.  Pain overall is much improved from previous.  Review of systems:     No chest pain, no SOB, no fevers, no urinary sx   Past Medical History:  Diagnosis Date   Allergy    History of basal cell carcinoma 01/09/2016   Hypertension    Infertility, female    Post-operative nausea and vomiting    Prolactinoma ([place])    Vitamin D deficiency 01/09/2016    Patient's surgical history, family medical history, social history, medications and allergies were all reviewed in Epic    Current Outpatient Medications  Medication Sig Dispense Refill   aluminum-magnesium hydroxide-simethicone (MAALOX) 200-200-20 MG/5ML SUSP Take 30 mLs by mouth 4 (four) times daily -  before meals and at bedtime.     ANUCORT-HC 25 MG suppository INSERT 1 SUPPOSITORY RECTALLY TWICE DAILY AS NEEDED FOR HEMORRHOIDS 24 suppository 0   benzonatate (TESSALON) 200 MG capsule Take 1 capsule (200 mg total) by mouth 2 (two) times daily as needed for cough. [place]  capsule 0   Betamethasone Diprop-Minoxidil 0.05-5 % SOLN Apply topically.     cholecalciferol (VITAMIN D) 1000 UNITS tablet Take 2,000 Units by mouth daily.     estradiol (ESTRACE) 0.1 MG/GM vaginal cream Per vagina three times per week (Patient taking  differently: 2 (two) times a week. Per vagina three times per week) 42.5 g 12   HYDROcodone bit-homatropine (HYCODAN) 5-1.5 MG/5ML syrup Take 5 mLs by mouth every 8 (eight) hours as needed for cough. 120 mL 0   LINZESS 72 MCG capsule Take 145 mcg by mouth every morning.     losartan (COZAAR) 25 MG tablet Take 1 tablet (25 mg total) by mouth daily. 90 tablet 3   medium chain triglycerides (MCT OIL) oil Take by mouth 3 (three) times daily.     triamcinolone cream (KENALOG) 0.1 % SMARTSIG:1 Application Topical 2-3 Times Daily     trimethoprim-polymyxin b (POLYTRIM) ophthalmic solution Place 1 drop into both eyes every 4 (four) hours. 10 mL 0   vitamin C (ASCORBIC ACID) 500 MG tablet Take 500 mg by mouth daily.     No current facility-administered medications for this visit.    Physical Exam:     BP 122/68   Pulse 79   Ht '5\' 3"'$  (1.6 m)   Wt 177 lb (80.3 kg)   SpO2 98%   BMI 31.35 kg/m   GENERAL:  Pleasant female in NAD PSYCH: : Cooperative, normal affect ABDOMEN:  Nondistended, soft, nontender. No obvious masses, no hepatomegaly,  normal bowel sounds SKIN:  turgor, no lesions seen Musculoskeletal:  Normal muscle tone, normal strength NEURO: Alert and oriented x 3, no focal neurologic deficits   IMPRESSION and PLAN:    1) Chronic constipation Good response to Linzess 145 mcg/day, but still requiring MiraLAX daily - Increase Linzess to 290 mcg/day - Hold MiraLAX in the initial 7-10 days after starting higher dose Linzess.  If still requiring laxative, can reintroduce MiraLAX at that time and titrate to effect - Ensure that you are drinking at least 64 ounces of water daily - Increase dietary fiber and add fiber supplement as needed  2) Pelvic pain Has had an extensive evaluation for pelvic pain.  Overall pain curve has improved  3) History of colon polyps - Repeat colonoscopy in 2029 for ongoing polyp surveillance  RTC prn           Farmer ,DO, FACG 03/11/2022,  9:20 AM

## 2022-03-11 NOTE — Patient Instructions (Addendum)
We have sent the following medications to your pharmacy for you to pick up at your convenience:   Linzess 290 MCG daily  Follow up as needed.  We have given you samples of the following medication to take: Linzess   _______________________________________________________  If your blood pressure at your visit was 140/90 or greater, please contact your primary care physician to follow up on this.  _______________________________________________________  If you are age 63 or younger, your body mass index should be between 19-25. Your Body mass index is 31.35 kg/m. If this is out of the aformentioned range listed, please consider follow up with your Primary Care Provider.   __________________________________________________________  The  GI providers would like to encourage you to use [facility] to communicate with providers for non-urgent requests or questions.  Due to long hold times on the telephone, sending your provider a message by [facility] may be a faster and more efficient way to get a response.  Please allow 48 business hours for a response.  Please remember that this is for non-urgent requests.   Due to recent changes in healthcare laws, you may see the results of your imaging and laboratory studies on MyChart before your provider has had a chance to review them.  We understand that in some cases there may be results that are confusing or concerning to you. Not all laboratory results come back in the same time frame and the provider may be waiting for multiple results in order to interpret others.  Please give [place] 48 hours in order for your provider to thoroughly review all the results before contacting the office for clarification of your results.     Thank you for choosing me and [facility].  Gerrit Heck, D.O. [facility]

## 2022-04-11 ENCOUNTER — Telehealth: Payer: Managed Care, Other (non HMO) | Admitting: Family

## 2022-04-11 DIAGNOSIS — A084 Viral intestinal infection, unspecified: Secondary | ICD-10-CM

## 2022-04-11 MED ORDER — ONDANSETRON 4 MG PO TBDP: 4.0000 mg | ORAL_TABLET | Freq: Three times a day (TID) | ORAL | 0 refills | Status: DC | PRN

## 2022-04-11 NOTE — Progress Notes (Signed)
Virtual Visit Consent   Stacy Preston, you are scheduled for a virtual visit with a [place] provider today. Just as with appointments in the office, your consent must be obtained to participate. Your consent will be active for this visit and any virtual visit you may have with one of our providers in the next 365 days. If you have a MyChart account, a copy of this consent can be sent to you electronically.  As this is a virtual visit, video technology does not allow for your provider to perform a traditional examination. This may limit your provider's ability to fully assess your condition. If your provider identifies any concerns that need to be evaluated in person or the need to arrange testing (such as labs, EKG, etc.), we will make arrangements to do so. Although advances in technology are sophisticated, we cannot ensure that it will always work on either your end or our end. If the connection with a video visit is poor, the visit may have to be switched to a telephone visit. With either a video or telephone visit, we are not always able to ensure that we have a secure connection.  By engaging in this virtual visit, you consent to the provision of healthcare and authorize for your insurance to be billed (if applicable) for the services provided during this visit. Depending on your insurance coverage, you may receive a charge related to this service.  I need to obtain your verbal consent now. Are you willing to proceed with your visit today? Stacy Preston has provided verbal consent on 04/11/2022 for a virtual visit (video or telephone). [name], FNP  Date: 04/11/2022 5:02 PM  Virtual Visit via Video Note   I, [name], connected with  Stacy Preston  ([id number], [birth year]) on 04/11/22 at  5:00 PM EST by a video-enabled telemedicine application and verified that I am speaking with the correct person using two identifiers.  Location: Patient: Virtual Visit Location  Patient: Home Provider: Virtual Visit Location Provider: Home Office   I discussed the limitations of evaluation and management by telemedicine and the availability of in person appointments. The patient expressed understanding and agreed to proceed.    History of Present Illness: Stacy Preston is a 62 y.o. who identifies as a female who was assigned female at birth, and is being seen today for vomiting that started this morning.  HPI: Emesis  This is a new problem. The current episode started today. The problem occurs 5 to 10 times per day. The problem has been unchanged. The emesis has an appearance of stomach contents. There has been no fever. Associated symptoms include diarrhea. Pertinent negatives include no chest pain, chills, coughing, myalgias or sweats. She has tried bed rest for the symptoms. The treatment provided mild relief.    Problems:  Patient Active Problem List   Diagnosis Date Noted   Influenza B 03/04/2022   Left foot pain 11/02/2021   Family history of early CAD 11/02/2021   Irritable bowel syndrome with constipation 09/08/2021   Acute medial meniscal tear, sequela 10/07/2020   Pain in right knee 09/01/2020   Lumbar radiculopathy 05/20/2020   Chronic anterior pelvic pain 05/20/2020   Knee pain 05/20/2020   Cervical stenosis (uterine cervix) 05/05/2020   Pelvic and perineal pain 04/24/2020   Cervical high risk HPV (human papillomavirus) test positive 01/08/2020   Well adult exam 11/28/2019   Elevated blood-pressure reading without diagnosis of hypertension 12/28/2018   Tinnitus 11/27/2018  GAD (generalized anxiety disorder) 12/21/2016   Atrophic vaginitis 02/27/2016   Vitamin D deficiency 01/09/2016   History of prolactinoma 01/09/2016   History of basal cell carcinoma 01/09/2016   Cervical disc disorder with radiculopathy of cervical region 01/09/2016   Elevated C-reactive protein (CRP) 01/09/2016    Allergies:  Allergies  Allergen Reactions    Lactose Intolerance (Gi)    Medications:  Current Outpatient Medications:    ondansetron (ZOFRAN-ODT) 4 MG disintegrating tablet, Take 1 tablet (4 mg total) by mouth every 8 (eight) hours as needed for nausea or vomiting., Disp: 60 tablet, Rfl: 0   aluminum-magnesium hydroxide-simethicone (MAALOX) 200-200-20 MG/5ML SUSP, Take 30 mLs by mouth 4 (four) times daily -  before meals and at bedtime., Disp: , Rfl:    ANUCORT-HC 25 MG suppository, INSERT 1 SUPPOSITORY RECTALLY TWICE DAILY AS NEEDED FOR HEMORRHOIDS, Disp: 24 suppository, Rfl: 0   benzonatate (TESSALON) 200 MG capsule, Take 1 capsule (200 mg total) by mouth 2 (two) times daily as needed for cough., Disp: 20 capsule, Rfl: 0   Betamethasone Diprop-Minoxidil 0.05-5 % SOLN, Apply topically., Disp: , Rfl:    cholecalciferol (VITAMIN D) 1000 UNITS tablet, Take 2,000 Units by mouth daily., Disp: , Rfl:    estradiol (ESTRACE) 0.1 MG/GM vaginal cream, Per vagina three times per week (Patient taking differently: 2 (two) times a week. Per vagina three times per week), Disp: 42.5 g, Rfl: 12   HYDROcodone bit-homatropine (HYCODAN) 5-1.5 MG/5ML syrup, Take 5 mLs by mouth every 8 (eight) hours as needed for cough., Disp: 120 mL, Rfl: 0   linaclotide (LINZESS) 290 MCG CAPS capsule, Take 1 capsule (290 mcg total) by mouth daily before breakfast., Disp: 90 capsule, Rfl: 1   losartan (COZAAR) 25 MG tablet, Take 1 tablet (25 mg total) by mouth daily., Disp: 90 tablet, Rfl: 3   medium chain triglycerides (MCT OIL) oil, Take by mouth 3 (three) times daily., Disp: , Rfl:    triamcinolone cream (KENALOG) 0.1 %, SMARTSIG:1 Application Topical 2-3 Times Daily, Disp: , Rfl:    trimethoprim-polymyxin b (POLYTRIM) ophthalmic solution, Place 1 drop into both eyes every 4 (four) hours., Disp: 10 mL, Rfl: 0   vitamin C (ASCORBIC ACID) 500 MG tablet, Take 500 mg by mouth daily., Disp: , Rfl:   Observations/Objective: Patient is well-developed, well-nourished in no acute  distress.  Resting comfortably  at home.  Head is normocephalic, atraumatic.  No labored breathing.  Speech is clear and coherent with logical content.  Patient is alert and oriented at baseline.    Assessment and Plan: 1. Viral gastroenteritis - ondansetron (ZOFRAN-ODT) 4 MG disintegrating tablet; Take 1 tablet (4 mg total) by mouth every 8 (eight) hours as needed for nausea or vomiting.  Dispense: 60 tablet; Refill: 0  Force fluids Bland diet  Tylenol as needed  Follow up if symptoms worsen or do not improve   Follow Up Instructions: I discussed the assessment and treatment plan with the patient. The patient was provided an opportunity to ask questions and all were answered. The patient agreed with the plan and demonstrated an understanding of the instructions.  A copy of instructions were sent to the patient via MyChart unless otherwise noted below.     The patient was advised to call back or seek an in-person evaluation if the symptoms worsen or if the condition fails to improve as anticipated.  Time:  I spent 8 minutes with the patient via telehealth technology discussing the above problems/concerns.  [name], FNP

## 2022-04-12 ENCOUNTER — Other Ambulatory Visit: Payer: Self-pay | Admitting: Family

## 2022-04-12 DIAGNOSIS — A084 Viral intestinal infection, unspecified: Secondary | ICD-10-CM

## 2022-05-25 ENCOUNTER — Telehealth: Payer: Self-pay

## 2022-05-25 NOTE — Telephone Encounter (Signed)
Just FYI Patient called to schld appt  for numbness in R arm and R leg  - Call was transferred to triage Patient  states she has numbness in R arm and R leg - with prolonged standing  - states symptoms come and go  for the past month and she has discussed these symptoms  with Dr. Zigmund Daniel in past. Patient denies any symptoms of stroke  and states she feels this may be  related to a herniated disc  She is  scheduled for April 18th and put on cancellation list for any appts sooner with Dr. Zigmund Daniel.

## 2022-05-30 ENCOUNTER — Encounter: Payer: Self-pay | Admitting: Family Medicine

## 2022-06-03 ENCOUNTER — Ambulatory Visit: Payer: Managed Care, Other (non HMO) | Admitting: Family Medicine

## 2022-06-03 ENCOUNTER — Encounter: Payer: Self-pay | Admitting: Family Medicine

## 2022-06-03 VITALS — BP 113/67 | HR 81 | Ht 63.0 in | Wt 176.0 lb

## 2022-06-03 DIAGNOSIS — K429 Umbilical hernia without obstruction or gangrene: Secondary | ICD-10-CM | POA: Diagnosis not present

## 2022-06-03 DIAGNOSIS — R7309 Other abnormal glucose: Secondary | ICD-10-CM | POA: Diagnosis not present

## 2022-06-03 DIAGNOSIS — R5383 Other fatigue: Secondary | ICD-10-CM

## 2022-06-03 DIAGNOSIS — K581 Irritable bowel syndrome with constipation: Secondary | ICD-10-CM | POA: Diagnosis not present

## 2022-06-03 DIAGNOSIS — R635 Abnormal weight gain: Secondary | ICD-10-CM

## 2022-06-03 NOTE — Assessment & Plan Note (Signed)
No obvious palpable hernia.  Limited ultrasound the abdomen ordered.

## 2022-06-03 NOTE — Progress Notes (Signed)
Stacy Preston - 62 y.o. female MRN [id number]  Date of birth: 05-10-1960  Subjective Chief Complaint  Patient presents with   Hernia    HPI Stacy Preston is a 62 year old female here today for follow-up  She reports that she has a friend who is starting a new clinic encouraged although that is doing IV rehydration as well as weight loss medications.  She has questions about tirzepatide and semaglutide.  She is considering trying one of these.  She would like to have additional labs including updated metabolic panel, A1c and B12.  She does have issues with chronic constipation and is currently on Linzess.  She also thinks she may have a hernia.  She has noticed intermittent protrusion around her umbilicus.  She does have history of prior laparoscopy.  She is usually able to push this back in or reduces when she lays down.  Blood pressure remains well-controlled with losartan at current strength.  ROS:  A comprehensive ROS was completed and negative except as noted per HPI  Allergies  Allergen Reactions   Lactose Intolerance (Gi)     Past Medical History:  Diagnosis Date   Allergy    History of basal cell carcinoma 01/09/2016   Hypertension    Infertility, female    Post-operative nausea and vomiting    Prolactinoma    Vitamin D deficiency 01/09/2016    Past Surgical History:  Procedure Laterality Date   BREAST BIOPSY Right 12/29/2020   COLONOSCOPY  2013, 2017   [place]   ivf     LAPAROSCOPY     for endometriosis   PLANTAR FASCIA RELEASE     PLANTAR FASCIA SURGERY      Social History   Socioeconomic History   Marital status: Married    Spouse name: Not on file   Number of children: Not on file   Years of education: Not on file   Highest education level: Not on file  Occupational History   Not on file  Tobacco Use   Smoking status: Never   Smokeless tobacco: Never  Vaping Use   Vaping Use: Never used  Substance and Sexual Activity   Alcohol  use: Yes    Alcohol/week: 2.0 standard drinks of alcohol    Types: 2 Standard drinks or equivalent per week   Drug use: No   Sexual activity: Yes    Birth control/protection: None  Other Topics Concern   Not on file  Social History Narrative   Not on file   Social Determinants of Health   Financial Resource Strain: Not on file  Food Insecurity: Not on file  Transportation Needs: Not on file  Physical Activity: Insufficiently Active (11/23/2017)   Exercise Vital Sign    Days of Exercise per Week: 1 day    Minutes of Exercise per Session: 40 min  Stress: Not on file  Social Connections: Not on file    Family History  Problem Relation Age of Onset   Renal Disease Mother    Diabetes Mother    Hypertension Mother    Cancer Father        Lung CA   Alcohol abuse Brother    Hypertension Brother    Sickle cell anemia Brother    Cancer Brother    Colon polyps Brother    Diabetes Maternal Aunt    Heart disease Maternal Grandmother    Diabetes Maternal Grandmother    Cancer Maternal Grandfather    Diabetes Maternal Grandfather  Colon cancer Maternal Grandfather    Stroke Paternal Grandmother    Heart disease Paternal Grandfather    Esophageal cancer Neg Hx    Rectal cancer Neg Hx    Stomach cancer Neg Hx     Health Maintenance  Topic Date Due   Zoster Vaccines- Shingrix (1 of 2) 12/03/2022 (Originally 09/07/1978)   COVID-19 Vaccine (4 - 2023-24 season) 12/19/2022 (Originally 10/23/2021)   INFLUENZA VACCINE  09/23/2022   PAP SMEAR-Modifier  12/20/2022   MAMMOGRAM  12/24/2023   COLONOSCOPY (Pts 45-38yrs Insurance coverage will need to be confirmed)  08/05/2025   DTaP/Tdap/Td (2 - Td or Tdap) 01/08/2026   Hepatitis C Screening  Completed   HIV Screening  Completed   HPV VACCINES  Aged Out      ----------------------------------------------------------------------------------------------------------------------------------------------------------------------------------------------------------------- Physical Exam BP 113/67 (BP Location: Right Arm, Patient Position: Sitting, Cuff Size: Normal)   Pulse 81   Ht 5\' 3"  (1.6 m)   Wt 176 lb (79.8 kg)   SpO2 99%   BMI 31.18 kg/m   Physical Exam Constitutional:      Appearance: Normal appearance.  HENT:     Head: Normocephalic and atraumatic.  Eyes:     General: No scleral icterus. Cardiovascular:     Rate and Rhythm: Normal rate and regular rhythm.  Pulmonary:     Effort: Pulmonary effort is normal.     Breath sounds: Normal breath sounds.  Musculoskeletal:     Cervical back: Neck supple.  Neurological:     Mental Status: She is alert.  Psychiatric:        Mood and Affect: Mood normal.        Behavior: Behavior normal.     ------------------------------------------------------------------------------------------------------------------------------------------------------------------------------------------------------------------- Assessment and Plan  Irritable bowel syndrome with constipation She remains on Linzess.  Pelvic floor rehab was not very helpful.  Abnormal weight gain She is considering GLP-1 to help with weight loss.  Updating labs including A1c.  We discussed that GLP-1 may contribute to worsening constipation.  Umbilical hernia without obstruction and without gangrene No obvious palpable hernia.  Limited ultrasound the abdomen ordered.   No orders of the defined types were placed in this encounter.   No follow-ups on file.    This visit occurred during the SARS-CoV-2 public health emergency.  Safety protocols were in place, including screening questions prior to the visit, additional usage of staff PPE, and extensive cleaning of exam room while observing appropriate contact time as  indicated for disinfecting solutions.

## 2022-06-03 NOTE — Assessment & Plan Note (Signed)
She is considering GLP-1 to help with weight loss.  Updating labs including A1c.  We discussed that GLP-1 may contribute to worsening constipation.

## 2022-06-03 NOTE — Assessment & Plan Note (Signed)
She remains on Linzess.  Pelvic floor rehab was not very helpful.

## 2022-06-04 ENCOUNTER — Ambulatory Visit (INDEPENDENT_AMBULATORY_CARE_PROVIDER_SITE_OTHER): Payer: Managed Care, Other (non HMO)

## 2022-06-04 ENCOUNTER — Encounter: Payer: Self-pay | Admitting: Family Medicine

## 2022-06-04 DIAGNOSIS — K429 Umbilical hernia without obstruction or gangrene: Secondary | ICD-10-CM

## 2022-06-04 LAB — COMPLETE METABOLIC PANEL WITH GFR
AG Ratio: 1.5 (calc) (ref 1.0–2.5)
ALT: 14 U/L (ref 6–29)
AST: 18 U/L (ref 10–35)
Albumin: 4.3 g/dL (ref 3.6–5.1)
Alkaline phosphatase (APISO): 66 U/L (ref 37–153)
BUN: 21 mg/dL (ref 7–25)
CO2: 27 mmol/L (ref 20–32)
Calcium: 9.4 mg/dL (ref 8.6–10.4)
Chloride: 106 mmol/L (ref 98–110)
Creat: 0.76 mg/dL (ref 0.50–1.05)
Globulin: 2.8 g/dL (calc) (ref 1.9–3.7)
Glucose, Bld: 90 mg/dL (ref 65–99)
Potassium: 4.3 mmol/L (ref 3.5–5.3)
Sodium: 141 mmol/L (ref 135–146)
Total Bilirubin: 0.6 mg/dL (ref 0.2–1.2)
Total Protein: 7.1 g/dL (ref 6.1–8.1)
eGFR: 89 mL/min/{1.73_m2} (ref >=60)

## 2022-06-04 LAB — HEMOGLOBIN A1C
Hgb A1c MFr Bld: 5.6 % of total Hgb (ref <5.7)
Mean Plasma Glucose: 114 mg/dL
eAG (mmol/L): 6.3 mmol/L

## 2022-06-04 LAB — VITAMIN B12: Vitamin B-12: 377 pg/mL (ref 200–1100)

## 2022-06-07 ENCOUNTER — Other Ambulatory Visit: Payer: Self-pay | Admitting: Family Medicine

## 2022-06-07 DIAGNOSIS — K439 Ventral hernia without obstruction or gangrene: Secondary | ICD-10-CM

## 2022-06-10 ENCOUNTER — Ambulatory Visit: Payer: Managed Care, Other (non HMO) | Admitting: Family Medicine

## 2022-06-18 ENCOUNTER — Encounter: Payer: Self-pay | Admitting: Family Medicine

## 2022-06-30 ENCOUNTER — Ambulatory Visit: Payer: Self-pay | Admitting: General Surgery

## 2022-07-08 ENCOUNTER — Encounter: Payer: Self-pay | Admitting: Family Medicine

## 2022-07-12 ENCOUNTER — Other Ambulatory Visit: Payer: Self-pay

## 2022-07-12 ENCOUNTER — Encounter (HOSPITAL_BASED_OUTPATIENT_CLINIC_OR_DEPARTMENT_OTHER): Payer: Self-pay | Admitting: General Surgery

## 2022-07-14 ENCOUNTER — Encounter (HOSPITAL_BASED_OUTPATIENT_CLINIC_OR_DEPARTMENT_OTHER)
Admission: RE | Admit: 2022-07-14 | Discharge: 2022-07-14 | Disposition: A | Discharge status: Home / Self Care | Payer: Managed Care, Other (non HMO) | Source: Ambulatory Visit | Attending: General Surgery | Admitting: General Surgery

## 2022-07-14 DIAGNOSIS — I10 Essential (primary) hypertension: Secondary | ICD-10-CM | POA: Insufficient documentation

## 2022-07-14 DIAGNOSIS — Z01818 Encounter for other preprocedural examination: Secondary | ICD-10-CM | POA: Diagnosis not present

## 2022-07-14 LAB — BASIC METABOLIC PANEL
Anion gap: 9 (ref 5–15)
BUN: 21 mg/dL (ref 8–23)
CO2: 26 mmol/L (ref 22–32)
Calcium: 9.4 mg/dL (ref 8.9–10.3)
Chloride: 102 mmol/L (ref 98–111)
Creatinine, Ser: 1.03 mg/dL — ABNORMAL HIGH (ref 0.44–1.00)
GFR, Estimated: >60 mL/min (ref >60)
Glucose, Bld: 94 mg/dL (ref 70–99)
Potassium: 5.1 mmol/L (ref 3.5–5.1)
Sodium: 137 mmol/L (ref 135–145)

## 2022-07-21 ENCOUNTER — Encounter (HOSPITAL_BASED_OUTPATIENT_CLINIC_OR_DEPARTMENT_OTHER): Payer: Self-pay | Admitting: General Surgery

## 2022-07-21 ENCOUNTER — Ambulatory Visit (HOSPITAL_BASED_OUTPATIENT_CLINIC_OR_DEPARTMENT_OTHER): Payer: Managed Care, Other (non HMO) | Admitting: Anesthesiology

## 2022-07-21 ENCOUNTER — Encounter (HOSPITAL_BASED_OUTPATIENT_CLINIC_OR_DEPARTMENT_OTHER)
Admission: RE | Disposition: A | Discharge status: Home / Self Care | Payer: Self-pay | Source: Home / Self Care | Attending: General Surgery

## 2022-07-21 ENCOUNTER — Ambulatory Visit (HOSPITAL_BASED_OUTPATIENT_CLINIC_OR_DEPARTMENT_OTHER)
Admission: RE | Admit: 2022-07-21 | Discharge: 2022-07-21 | Disposition: A | Discharge status: Home / Self Care | Payer: Managed Care, Other (non HMO) | Attending: General Surgery | Admitting: General Surgery

## 2022-07-21 ENCOUNTER — Other Ambulatory Visit: Payer: Self-pay

## 2022-07-21 DIAGNOSIS — K429 Umbilical hernia without obstruction or gangrene: Secondary | ICD-10-CM | POA: Insufficient documentation

## 2022-07-21 DIAGNOSIS — F419 Anxiety disorder, unspecified: Secondary | ICD-10-CM | POA: Diagnosis not present

## 2022-07-21 DIAGNOSIS — I10 Essential (primary) hypertension: Secondary | ICD-10-CM

## 2022-07-21 HISTORY — PX: UMBILICAL HERNIA REPAIR: SHX196

## 2022-07-21 HISTORY — PX: INSERTION OF MESH: SHX5868

## 2022-07-21 SURGERY — REPAIR, HERNIA, UMBILICAL, ADULT
Anesthesia: General | Site: Abdomen

## 2022-07-21 MED ORDER — ACETAMINOPHEN 500 MG PO TABS
1000.0000 mg | ORAL_TABLET | ORAL | Status: AC
  Administered 2022-07-21: 1000 mg via ORAL

## 2022-07-21 MED ORDER — FENTANYL CITRATE (PF) 100 MCG/2ML IJ SOLN
INTRAMUSCULAR | Status: DC | PRN
  Administered 2022-07-21: 100 ug via INTRAVENOUS

## 2022-07-21 MED ORDER — LACTATED RINGERS IV SOLN: INTRAVENOUS | Status: DC | PRN

## 2022-07-21 MED ORDER — PROPOFOL 500 MG/50ML IV EMUL
INTRAVENOUS | Status: DC | PRN
  Administered 2022-07-21: 35 ug/kg/min via INTRAVENOUS

## 2022-07-21 MED ORDER — PROPOFOL 10 MG/ML IV BOLUS
INTRAVENOUS | Status: AC
  Filled 2022-07-21: qty 20

## 2022-07-21 MED ORDER — LIDOCAINE 2% (20 MG/ML) 5 ML SYRINGE
INTRAMUSCULAR | Status: AC
  Filled 2022-07-21: qty 5

## 2022-07-21 MED ORDER — OXYCODONE HCL 5 MG PO TABS
ORAL_TABLET | ORAL | Status: AC
  Filled 2022-07-21: qty 1

## 2022-07-21 MED ORDER — DEXAMETHASONE SODIUM PHOSPHATE 10 MG/ML IJ SOLN
INTRAMUSCULAR | Status: DC | PRN
  Administered 2022-07-21: 5 mg via INTRAVENOUS

## 2022-07-21 MED ORDER — FENTANYL CITRATE (PF) 100 MCG/2ML IJ SOLN
INTRAMUSCULAR | Status: AC
  Filled 2022-07-21: qty 2

## 2022-07-21 MED ORDER — BUPIVACAINE-EPINEPHRINE 0.25% -1:200000 IJ SOLN
INTRAMUSCULAR | Status: DC | PRN
  Administered 2022-07-21: 18 mL

## 2022-07-21 MED ORDER — CHLORHEXIDINE GLUCONATE CLOTH 2 % EX PADS: 6.0000 | MEDICATED_PAD | Freq: Once | CUTANEOUS | Status: DC

## 2022-07-21 MED ORDER — CEFAZOLIN SODIUM-DEXTROSE 2-3 GM-%(50ML) IV SOLR
INTRAVENOUS | Status: DC | PRN
  Administered 2022-07-21: 2 g via INTRAVENOUS

## 2022-07-21 MED ORDER — GABAPENTIN 300 MG PO CAPS
300.0000 mg | ORAL_CAPSULE | ORAL | Status: AC
  Administered 2022-07-21: 300 mg via ORAL

## 2022-07-21 MED ORDER — FENTANYL CITRATE (PF) 100 MCG/2ML IJ SOLN
25.0000 ug | INTRAMUSCULAR | Status: DC | PRN
  Administered 2022-07-21 (×2): 25 ug via INTRAVENOUS
  Administered 2022-07-21: 50 ug via INTRAVENOUS

## 2022-07-21 MED ORDER — CEFAZOLIN SODIUM-DEXTROSE 2-4 GM/100ML-% IV SOLN: 2.0000 g | INTRAVENOUS | Status: DC

## 2022-07-21 MED ORDER — ONDANSETRON HCL 4 MG/2ML IJ SOLN
INTRAMUSCULAR | Status: DC | PRN
  Administered 2022-07-21: 4 mg via INTRAVENOUS

## 2022-07-21 MED ORDER — ONDANSETRON HCL 4 MG/2ML IJ SOLN
INTRAMUSCULAR | Status: AC
  Filled 2022-07-21: qty 2

## 2022-07-21 MED ORDER — SCOPOLAMINE 1 MG/3DAYS TD PT72
MEDICATED_PATCH | TRANSDERMAL | Status: AC
  Filled 2022-07-21: qty 1

## 2022-07-21 MED ORDER — LIDOCAINE HCL (CARDIAC) PF 100 MG/5ML IV SOSY
PREFILLED_SYRINGE | INTRAVENOUS | Status: DC | PRN
  Administered 2022-07-21: 60 mg via INTRAVENOUS

## 2022-07-21 MED ORDER — OXYCODONE HCL 5 MG/5ML PO SOLN: 5.0000 mg | Freq: Once | ORAL | Status: AC | PRN

## 2022-07-21 MED ORDER — CELECOXIB 200 MG PO CAPS
200.0000 mg | ORAL_CAPSULE | ORAL | Status: AC
  Administered 2022-07-21: 200 mg via ORAL

## 2022-07-21 MED ORDER — SCOPOLAMINE 1 MG/3DAYS TD PT72
1.0000 | MEDICATED_PATCH | TRANSDERMAL | Status: DC
  Administered 2022-07-21: 1.5 mg via TRANSDERMAL

## 2022-07-21 MED ORDER — GABAPENTIN 300 MG PO CAPS
ORAL_CAPSULE | ORAL | Status: AC
  Filled 2022-07-21: qty 1

## 2022-07-21 MED ORDER — MIDAZOLAM HCL 2 MG/2ML IJ SOLN
INTRAMUSCULAR | Status: DC | PRN
  Administered 2022-07-21: 2 mg via INTRAVENOUS

## 2022-07-21 MED ORDER — LACTATED RINGERS IV SOLN: INTRAVENOUS | Status: DC

## 2022-07-21 MED ORDER — PROPOFOL 10 MG/ML IV BOLUS
INTRAVENOUS | Status: DC | PRN
  Administered 2022-07-21: 150 mg via INTRAVENOUS

## 2022-07-21 MED ORDER — CEFAZOLIN SODIUM-DEXTROSE 2-4 GM/100ML-% IV SOLN
INTRAVENOUS | Status: AC
  Filled 2022-07-21: qty 100

## 2022-07-21 MED ORDER — CELECOXIB 200 MG PO CAPS
ORAL_CAPSULE | ORAL | Status: AC
  Filled 2022-07-21: qty 1

## 2022-07-21 MED ORDER — OXYCODONE HCL 5 MG PO TABS
5.0000 mg | ORAL_TABLET | Freq: Once | ORAL | Status: AC | PRN
  Administered 2022-07-21: 5 mg via ORAL

## 2022-07-21 MED ORDER — OXYCODONE HCL 5 MG PO TABS: 5.0000 mg | ORAL_TABLET | Freq: Four times a day (QID) | ORAL | 0 refills | Status: DC | PRN

## 2022-07-21 MED ORDER — MIDAZOLAM HCL 2 MG/2ML IJ SOLN
INTRAMUSCULAR | Status: AC
  Filled 2022-07-21: qty 2

## 2022-07-21 MED ORDER — ACETAMINOPHEN 500 MG PO TABS
ORAL_TABLET | ORAL | Status: AC
  Filled 2022-07-21: qty 2

## 2022-07-21 MED ORDER — AMISULPRIDE (ANTIEMETIC) 5 MG/2ML IV SOLN: 10.0000 mg | Freq: Once | INTRAVENOUS | Status: DC | PRN

## 2022-07-21 MED ORDER — DEXAMETHASONE SODIUM PHOSPHATE 10 MG/ML IJ SOLN
INTRAMUSCULAR | Status: AC
  Filled 2022-07-21: qty 1

## 2022-07-21 SURGICAL SUPPLY — 46 items
ADH SKN CLS APL DERMABOND .7 (GAUZE/BANDAGES/DRESSINGS) ×1
APL PRP STRL LF DISP 70% ISPRP (MISCELLANEOUS) ×1
APL SKNCLS STERI-STRIP NONHPOA (GAUZE/BANDAGES/DRESSINGS)
BENZOIN TINCTURE PRP APPL 2/3 (GAUZE/BANDAGES/DRESSINGS) IMPLANT
BLADE SURG 15 STRL LF DISP TIS (BLADE) ×1 IMPLANT
BLADE SURG 15 STRL SS (BLADE) ×1
CHLORAPREP W/TINT 26 (MISCELLANEOUS) ×1 IMPLANT
COVER BACK TABLE 60X90IN (DRAPES) ×1 IMPLANT
COVER MAYO STAND STRL (DRAPES) ×1 IMPLANT
DERMABOND ADVANCED .7 DNX12 (GAUZE/BANDAGES/DRESSINGS) ×1 IMPLANT
DRAPE LAPAROTOMY TRNSV 102X78 (DRAPES) ×1 IMPLANT
DRAPE UTILITY XL STRL (DRAPES) ×1 IMPLANT
DRSG TEGADERM 4X4.75 (GAUZE/BANDAGES/DRESSINGS) IMPLANT
ELECT COATED BLADE 2.86 ST (ELECTRODE) ×1 IMPLANT
ELECT REM PT RETURN 9FT ADLT (ELECTROSURGICAL) ×1
ELECTRODE REM PT RTRN 9FT ADLT (ELECTROSURGICAL) ×1 IMPLANT
GAUZE SPONGE 4X4 12PLY STRL LF (GAUZE/BANDAGES/DRESSINGS) IMPLANT
GLOVE BIO SURGEON STRL SZ 6.5 (GLOVE) IMPLANT
GLOVE BIO SURGEON STRL SZ7.5 (GLOVE) ×1 IMPLANT
GLOVE BIOGEL PI IND STRL 6.5 (GLOVE) IMPLANT
GOWN STRL REUS W/ TWL LRG LVL3 (GOWN DISPOSABLE) ×2 IMPLANT
GOWN STRL REUS W/TWL LRG LVL3 (GOWN DISPOSABLE) ×2
MESH VENTRALEX ST 2.5 CRC MED (Mesh General) IMPLANT
NDL HYPO 22X1.5 SAFETY MO (MISCELLANEOUS) IMPLANT
NDL HYPO 25X1 1.5 SAFETY (NEEDLE) ×1 IMPLANT
NEEDLE HYPO 22X1.5 SAFETY MO (MISCELLANEOUS) ×1 IMPLANT
NEEDLE HYPO 25X1 1.5 SAFETY (NEEDLE) ×1 IMPLANT
NS IRRIG 1000ML POUR BTL (IV SOLUTION) IMPLANT
PACK BASIN DAY SURGERY FS (CUSTOM PROCEDURE TRAY) ×1 IMPLANT
PENCIL SMOKE EVACUATOR (MISCELLANEOUS) ×1 IMPLANT
SLEEVE SCD COMPRESS KNEE MED (STOCKING) IMPLANT
SPIKE FLUID TRANSFER (MISCELLANEOUS) ×1 IMPLANT
SPONGE T-LAP 18X18 ~~LOC~~+RFID (SPONGE) ×1 IMPLANT
STRIP CLOSURE SKIN 1/2X4 (GAUZE/BANDAGES/DRESSINGS) IMPLANT
SUT MON AB 4-0 PC3 18 (SUTURE) ×1 IMPLANT
SUT NOVA NAB DX-16 0-1 5-0 T12 (SUTURE) IMPLANT
SUT NOVA NAB GS-21 1 T12 (SUTURE) ×1 IMPLANT
SUT SILK 3 0 SH 30 (SUTURE) IMPLANT
SUT VIC AB 2-0 SH 27 (SUTURE) ×1
SUT VIC AB 2-0 SH 27XBRD (SUTURE) ×1 IMPLANT
SUT VIC AB 3-0 54X BRD REEL (SUTURE) IMPLANT
SUT VIC AB 3-0 BRD 54 (SUTURE)
SUT VIC AB 3-0 SH 27 (SUTURE) ×1
SUT VIC AB 3-0 SH 27X BRD (SUTURE) IMPLANT
SYR CONTROL 10ML LL (SYRINGE) ×1 IMPLANT
TOWEL GREEN STERILE FF (TOWEL DISPOSABLE) ×2 IMPLANT

## 2022-07-21 NOTE — Discharge Instructions (Signed)
May take Tylenol after 3pm, if needed May take NSAIDS (ibuprofen/motrin) after 3pm, if needed.    Post Anesthesia Home Care Instructions  Activity: Get plenty of rest for the remainder of the day. A responsible individual must stay with you for 24 hours following the procedure.  For the next 24 hours, DO NOT: -Drive a car -Advertising copywriter -Drink alcoholic beverages -Take any medication unless instructed by your physician -Make any legal decisions or sign important papers.  Meals: Start with liquid foods such as gelatin or soup. Progress to regular foods as tolerated. Avoid greasy, spicy, heavy foods. If nausea and/or vomiting occur, drink only clear liquids until the nausea and/or vomiting subsides. Call your physician if vomiting continues.  Special Instructions/Symptoms: Your throat may feel dry or sore from the anesthesia or the breathing tube placed in your throat during surgery. If this causes discomfort, gargle with warm salt water. The discomfort should disappear within 24 hours.  If you had a scopolamine patch placed behind your ear for the management of post- operative nausea and/or vomiting:  1. The medication in the patch is effective for 72 hours, after which it should be removed.  Wrap patch in a tissue and discard in the trash. Wash hands thoroughly with soap and water. 2. You may remove the patch earlier than 72 hours if you experience unpleasant side effects which may include dry mouth, dizziness or visual disturbances. 3. Avoid touching the patch. Wash your hands with soap and water after contact with the patch.

## 2022-07-21 NOTE — Transfer of Care (Signed)
Immediate Anesthesia Transfer of Care Note  Patient: Stacy Preston  Procedure(s) Performed: HERNIA REPAIR UMBILICAL ADULT WITH POSSIBLE MESH (Abdomen) INSERTION OF MESH (Abdomen)  Patient Location: PACU  Anesthesia Type:General  Level of Consciousness: awake, alert , and oriented  Airway & Oxygen Therapy: Patient Spontanous Breathing and Patient connected to face mask oxygen  Post-op Assessment: Report given to RN and Post -op Vital signs reviewed and stable  Post vital signs: Reviewed and stable  Last Vitals:  Vitals Value Taken Time  BP 142/74 07/21/22 1116  Temp    Pulse 83 07/21/22 1118  Resp 17 07/21/22 1118  SpO2 100 % 07/21/22 1118  Vitals shown include unvalidated device data.  Last Pain:  Vitals:   07/21/22 0908  TempSrc: Temporal  PainSc: 0-No pain      Patients Stated Pain Goal: 3 (07/21/22 0908)  Complications: No notable events documented.

## 2022-07-21 NOTE — Anesthesia Preprocedure Evaluation (Addendum)
Anesthesia Evaluation  Patient identified by MRN, date of birth, ID band Patient awake    Reviewed: Allergy & Precautions, NPO status , Patient's Chart, lab work & pertinent test results  History of Anesthesia Complications (+) PONV and history of anesthetic complications  Airway Mallampati: III  TM Distance: >3 FB Neck ROM: Full    Dental  (+) Dental Advisory Given   Pulmonary neg pulmonary ROS   Pulmonary exam normal breath sounds clear to auscultation       Cardiovascular hypertension (losartan, spironolactone), Pt. on medications (-) angina (-) Past MI, (-) Cardiac Stents and (-) CABG (-) dysrhythmias  Rhythm:Regular Rate:Normal     Neuro/Psych neg Seizures PSYCHIATRIC DISORDERS Anxiety      Neuromuscular disease (cervical radiculopathy, lumbar radiculopathy)    GI/Hepatic negative GI ROS, Neg liver ROS,,,  Endo/Other  negative endocrine ROS    Renal/GU negative Renal ROS     Musculoskeletal   Abdominal   Peds  Hematology negative hematology ROS (+)   Anesthesia Other Findings K 5.1  Reproductive/Obstetrics                             Anesthesia Physical Anesthesia Plan  ASA: 2  Anesthesia Plan: General   Post-op Pain Management: Tylenol PO (pre-op)*   Induction: Intravenous  PONV Risk Score and Plan: 4 or greater and Ondansetron, Dexamethasone, Midazolam, Scopolamine patch - Pre-op and Treatment may vary due to age or medical condition  Airway Management Planned: Oral ETT  Additional Equipment:   Intra-op Plan:   Post-operative Plan: Extubation in OR  Informed Consent: I have reviewed the patients History and Physical, chart, labs and discussed the procedure including the risks, benefits and alternatives for the proposed anesthesia with the patient or authorized representative who has indicated his/her understanding and acceptance.     Dental advisory given  Plan  Discussed with: CRNA and Anesthesiologist  Anesthesia Plan Comments: (Risks of general anesthesia discussed including, but not limited to, sore throat, hoarse voice, chipped/damaged teeth, injury to vocal cords, nausea and vomiting, allergic reactions, lung infection, heart attack, stroke, and death. All questions answered. )       Anesthesia Quick Evaluation

## 2022-07-21 NOTE — Interval H&P Note (Signed)
History and Physical Interval Note:  07/21/2022 9:48 AM  Stacy Preston  has presented today for surgery, with the diagnosis of umbilical hernia.  The various methods of treatment have been discussed with the patient and family. After consideration of risks, benefits and other options for treatment, the patient has consented to  Procedure(s): HERNIA REPAIR UMBILICAL ADULT WITH POSSIBLE MESH (N/A) as a surgical intervention.  The patient's history has been reviewed, patient examined, no change in status, stable for surgery.  I have reviewed the patient's chart and labs.  Questions were answered to the patient's satisfaction.     [name]

## 2022-07-21 NOTE — Anesthesia Postprocedure Evaluation (Signed)
Anesthesia Post Note  Patient: Stacy Preston  Procedure(s) Performed: HERNIA REPAIR UMBILICAL ADULT WITH POSSIBLE MESH (Abdomen) INSERTION OF MESH (Abdomen)     Patient location during evaluation: PACU Anesthesia Type: General Level of consciousness: awake Pain management: pain level controlled Vital Signs Assessment: post-procedure vital signs reviewed and stable Respiratory status: spontaneous breathing, nonlabored ventilation and respiratory function stable Cardiovascular status: blood pressure returned to baseline and stable Postop Assessment: no apparent nausea or vomiting Anesthetic complications: no   No notable events documented.  Last Vitals:  Vitals:   07/21/22 1215 07/21/22 1222  BP: 135/69 139/79  Pulse: 70 75  Resp: 13 14  Temp:  (!) 36.4 C  SpO2: 97% 97%    Last Pain:  Vitals:   07/21/22 1227  TempSrc:   PainSc: 4                  Linton Rump

## 2022-07-21 NOTE — H&P (Signed)
REFERRING PHYSICIAN: Antony Haste, DO PROVIDER: Lindell Noe, MD MRN: [id number] DOB: [birth year] Subjective   Chief Complaint: New Consultation and Hernia  History of Present Illness: Stacy Preston is a 62 y.o. female who is seen today as an office consultation for evaluation of New Consultation and Hernia  We are asked to see the patient in consultation by Dr. Everrett Coombe to evaluate her for a new umbilical hernia. The patient is a 62 year old white female who has been having some intermittent pain and bulging near the umbilicus. The worst episode occurred yesterday. She denied any fevers or chills. She denies any nausea or vomiting with it. She is otherwise in pretty good health and does not smoke. She underwent an ultrasound which estimated the size of the hernia to be 3.8 cm. The fascial defect is likely quite a bit smaller than this.  Review of Systems: A complete review of systems was obtained from the patient. I have reviewed this information and discussed as appropriate with the patient. See HPI as well for other ROS.  ROS   Medical History: Past Medical History:  Diagnosis Date  History of cancer  Hypertension   Patient Active Problem List  Diagnosis  Umbilical hernia without obstruction or gangrene   Past Surgical History:  Procedure Laterality Date  DILATION AND CURETTAGE, DIAGNOSTIC / THERAPEUTIC  UNLISTED PROCEDURE PHARYNX/ADENOIDS/TONSILS    Allergies  Allergen Reactions  Lactose Unknown   Current Outpatient Medications on File Prior to Visit  Medication Sig Dispense Refill  ergocalciferol, vitamin D2, 10 mcg (400 unit) Tab Take by mouth  linaCLOtide (LINZESS) 290 mcg capsule Take by mouth  cholecalciferol (VITAMIN D3) 1000 unit tablet Take 2,000 Units by mouth once daily   No current facility-administered medications on file prior to visit.   History reviewed. No pertinent family history.   Social History   Tobacco Use  Smoking Status  Never  Smokeless Tobacco Never    Social History   Socioeconomic History  Marital status: Married  Tobacco Use  Smoking status: Never  Smokeless tobacco: Never  Substance and Sexual Activity  Alcohol use: Not Currently  Drug use: Never   Social Determinants of Health   Physical Activity: Insufficiently Active (11/23/2017)  Received from [facility]  Exercise Vital Sign  Days of Exercise per Week: 1 day  Minutes of Exercise per Session: 40 min  Received from [employer]  Social Network   Objective:   Vitals:  BP: 113/72  Pulse: 90  Temp: 36.8 C (98.3 F)  SpO2: 99%  Weight: 77.1 kg (170 lb)  Height: 160 cm (5\' 3" )  PainSc: 0-No pain   Body mass index is 30.11 kg/m.  Physical Exam Vitals reviewed.  Constitutional:  General: She is not in acute distress. Appearance: Normal appearance.  HENT:  Head: Normocephalic and atraumatic.  Right Ear: External ear normal.  Left Ear: External ear normal.  Nose: Nose normal.  Mouth/Throat:  Mouth: Mucous membranes are moist.  Pharynx: Oropharynx is clear.  Eyes:  General: No scleral icterus. Extraocular Movements: Extraocular movements intact.  Conjunctiva/sclera: Conjunctivae normal.  Pupils: Pupils are equal, round, and reactive to light.  Cardiovascular:  Rate and Rhythm: Normal rate and regular rhythm.  Pulses: Normal pulses.  Heart sounds: Normal heart sounds.  Pulmonary:  Effort: Pulmonary effort is normal. No respiratory distress.  Breath sounds: Normal breath sounds.  Abdominal:  General: Bowel sounds are normal.  Palpations: Abdomen is soft.  Tenderness: There is no abdominal tenderness.  Comments: There  is a small reducible bulge at the umbilicus. It is difficult to feel the actual fascial defect.  Musculoskeletal:  General: No swelling, tenderness or deformity. Normal range of motion.  Cervical back: Normal range of motion and neck supple.  Skin: General: Skin is warm and dry.  Coloration: Skin is  not jaundiced.  Neurological:  General: No focal deficit present.  Mental Status: She is alert and oriented to person, place, and time.  Psychiatric:  Mood and Affect: Mood normal.  Behavior: Behavior normal.     Labs, Imaging and Diagnostic Testing:  Assessment and Plan:   Diagnoses and all orders for this visit:  Umbilical hernia without obstruction or gangrene   The patient appears to have a small reducible but symptomatic umbilical hernia. Because of the risk of incarceration and strangulation I feel she would benefit from having this fixed. She would also like to have this done. I have discussed with her in detail the risks and benefits of the operation as well as some of the technical aspects including the use of mesh and the risk of chronic pain and she understands and wishes to proceed. She has some trips coming up so she would like to plan this closer to the end of summer which I think is reasonable. She understands that if she starts having worsening pain that she may have to go to the emergency department and have this done more urgently. Otherwise we will work on surgical scheduling

## 2022-07-21 NOTE — Op Note (Signed)
07/21/2022  10:55 AM  PATIENT:  Stacy Preston  62 y.o. female  PRE-OPERATIVE DIAGNOSIS:  umbilical hernia  POST-OPERATIVE DIAGNOSIS:  umbilical hernia  PROCEDURE:  Procedure(s): HERNIA REPAIR UMBILICAL ADULT WITH MESH (N/A) INSERTION OF MESH (N/A)  SURGEON:  Surgeon(s) and Role:    * Griselda Miner, MD - Primary    * Maczis, Hedda Slade, PA-C - Assisting  PHYSICIAN ASSISTANT:   ASSISTANTS: Puja Maczis, PA   ANESTHESIA:   local and general  EBL:  30 mL   BLOOD ADMINISTERED:none  DRAINS: none   LOCAL MEDICATIONS USED:  MARCAINE     SPECIMEN:  No Specimen  DISPOSITION OF SPECIMEN:  N/A  COUNTS:  YES  TOURNIQUET:  * No tourniquets in log *  DICTATION: .Dragon Dictation  After informed consent was obtained the patient was brought to the operating room and placed in the supine position on the operating table.  After adequate induction of general anesthesia the patient's abdomen was prepped with ChloraPrep, allowed to dry, and draped in usual sterile manner.  An appropriate timeout was performed.  The area around the umbilicus was then infiltrated with quarter percent Marcaine.  A small transversely oriented incision was made with a 15 blade knife just above the edge of the umbilicus through a previous incision.  The incision was carried through the skin and subcutaneous tissue sharply with the electrocautery.  I was able to identify the hernia sac.  The hernia sac was separated from the rest of the subcutaneous tissue sharply with the electrocautery.  The hernia sac was then able to be reduced.  There did not appear to be any visceral contents within the sac.  The preperitoneal space was then probed with some finger dissection to free up any adhesions to the fascia of the abdominal wall.  I then chose a medium size piece of umbilical mesh.  The mesh was oriented with the coated side down.  The mesh was placed through the fascial defect and into the preperitoneal space.  The  hernia itself measured about 3 cm and the fascial defect was approximately a centimeter and a half.  Once the mesh was in good position in the preperitoneal space the anchors of the mesh were trimmed.  The fascial defect was then closed with interrupted #1 Novafil stitches incorporating the anchors of the mesh.  Once this was accomplished the mesh appeared to be in good position and the hernia seem well repaired.  The subcutaneous tissue was irrigated with copious amounts of saline and infiltrated with more quarter percent Marcaine.  The base of the umbilicus was then tacked back to the fascia with an interrupted 2-0 Vicryl stitch.  The subcutaneous tissue was closed with interrupted 2-0 Vicryl stitches.  The skin was then closed with a running 4-0 Monocryl subcuticular stitch.  Dermabond dressings were applied.  The patient tolerated the procedure well.  At the end of the case all needle sponge and instrument counts were correct.  The patient was then awakened and taken to recovery in stable condition.  PLAN OF CARE: Discharge to home after PACU  PATIENT DISPOSITION:  PACU - hemodynamically stable.   Delay start of Pharmacological VTE agent (>24hrs) due to surgical blood loss or risk of bleeding: not applicable

## 2022-07-22 ENCOUNTER — Encounter (HOSPITAL_BASED_OUTPATIENT_CLINIC_OR_DEPARTMENT_OTHER): Payer: Self-pay | Admitting: General Surgery

## 2022-07-29 NOTE — Progress Notes (Signed)
I, Stevenson Clinch, CMA acting as a scribe for Stacy Graham, MD.  Stacy Preston is a 62 y.o. female who presents to [facility] today for exacerbation of her R knee pain. Pt was last seen by Dr. Denyse Amass on 05/18/21 and was given a R knee steroid injection.   Today, pt reports significant improvement of knee sx with last steroid injection. [employer] day weekend and landed on the right knee. Continues to have pain in the knee. Swelling present. Pain at the joint line. Feels unstable. Sx worse with ambulation. Sx feel similar to prior injury.   Also notes recent hernia surgery.  She had an umbilical hernia surgery on May 29.  She feels pretty well and notes the wound is healing.  She did get a little rash around the wound on the skin which is improvement with hydrocortisone cream.  Dx imaging: 08/09/20 R knee MRI             08/03/20 R knee XR             09/26/18 R knee XR  Pertinent review of systems: No fevers or chills  Relevant historical information: Recent hernia surgery.   Exam:  BP 116/78   Pulse 91   Wt 167 lb 3.2 oz (75.8 kg)   SpO2 99%   BMI 29.62 kg/m  General: Well Developed, well nourished, and in no acute distress.   MSK: Right knee abrasion anterior knee healing.  Otherwise relatively normal-appearing Mildly tender palpation anterior lateral knee.  Not particularly tender overlying patella or patellar tendon. Normal motion. Some guarding with that stability testing.  No profound laxity noted. Mildly positive Murray's test. Intact strength to extension and flexion.  Skin abdomen: Healing surgical incision umbilical hernia.  No erythema or induration around the wound.  Nontender.  No oozing pus.  Lab and Radiology Results   Procedure: Real-time Ultrasound Guided Injection of right knee joint superior lateral patellar space Device: Philips Affiniti 50G Images permanently stored and available for review in PACS Verbal informed  consent obtained.  Discussed risks and benefits of procedure. Warned about infection, bleeding, hyperglycemia damage to structures among others. Patient expresses understanding and agreement Time-out conducted.   Noted no overlying erythema, induration, or other signs of local infection.   Skin prepped in a sterile fashion.   Local anesthesia: Topical Ethyl chloride.   With sterile technique and under real time ultrasound guidance: 40 mg of Kenalog and 2 mL Marcaine injected into knee joint. Fluid seen entering the joint capsule.   Completed without difficulty   Pain immediately resolved suggesting accurate placement of the medication.   Advised to call if fevers/chills, erythema, induration, drainage, or persistent bleeding.   Images permanently stored and available for review in the ultrasound unit.  Impression: Technically successful ultrasound guided injection.    X-ray images right knee obtained today personally and independently interpreted No acute fractures.  Mild medial compartment DJD. Await formal radiology review   Assessment and Plan: 62 y.o. female with right knee pain after fall.  This could be partially due to the contusion and likely due to exacerbation of existing degenerative changes seen on prior MRI.  Plan for steroid injection and continued activity as tolerated.  We talked about the risk of impaired wound healing from steroids.  She had an umbilical hernia surgery about 9 days ago.  The wound looks well and I think the benefit of intra-articular steroid injection will be worth the  minimal risk to her umbilical hernia healing incision.Marland Kitchen   PDMP not reviewed this encounter. Orders Placed This Encounter  Procedures   [place] LIMITED JOINT SPACE STRUCTURES LOW RIGHT(NO LINKED CHARGES)    Order Specific Question:   Reason for Exam (SYMPTOM  OR DIAGNOSIS REQUIRED)    Answer:   right knee pain    Order Specific Question:   Preferred imaging location?    Answer:   [facility] Knee AP/LAT W/Sun[place] Right    Standing Status:   Future    Number of Occurrences:   1    Standing Expiration Date:   08/29/2022    Order Specific Question:   Reason for Exam (SYMPTOM  OR DIAGNOSIS REQUIRED)    Answer:   right knee pain    Order Specific Question:   Preferred imaging location?    Answer:   [facility]   No orders of the defined types were placed in this encounter.    Discussed warning signs or symptoms. Please see discharge instructions. Patient expresses understanding.   The above documentation has been reviewed and is accurate and complete Stacy Preston, M.D.

## 2022-07-30 ENCOUNTER — Encounter: Payer: Self-pay | Admitting: Family Medicine

## 2022-07-30 ENCOUNTER — Ambulatory Visit: Payer: Managed Care, Other (non HMO) | Admitting: Family Medicine

## 2022-07-30 ENCOUNTER — Ambulatory Visit (INDEPENDENT_AMBULATORY_CARE_PROVIDER_SITE_OTHER): Payer: Managed Care, Other (non HMO)

## 2022-07-30 ENCOUNTER — Other Ambulatory Visit: Payer: Self-pay

## 2022-07-30 VITALS — BP 116/78 | HR 91 | Wt 167.2 lb

## 2022-07-30 DIAGNOSIS — M25561 Pain in right knee: Secondary | ICD-10-CM

## 2022-07-30 NOTE — Patient Instructions (Addendum)
Thank you for coming in today.   Please get an Xray today before you leave   You received an injection today. Seek immediate medical attention if the joint becomes red, extremely painful, or is oozing fluid.   Let me know how this goes.   Enjoy [place] this weekend.

## 2022-08-02 NOTE — Progress Notes (Signed)
Right knee x-ray shows a little bit of arthritis.

## 2022-08-19 ENCOUNTER — Encounter: Payer: Self-pay | Admitting: Family Medicine

## 2022-08-21 ENCOUNTER — Encounter: Payer: Self-pay | Admitting: Family Medicine

## 2022-08-27 MED ORDER — ZEPBOUND 5 MG/0.5ML ~~LOC~~ SOAJ: 5.0000 mg | SUBCUTANEOUS | 1 refills | Status: DC

## 2022-08-27 MED ORDER — ZEPBOUND 2.5 MG/0.5ML ~~LOC~~ SOAJ: 2.5000 mg | SUBCUTANEOUS | 0 refills | Status: DC

## 2022-09-14 ENCOUNTER — Encounter: Payer: Self-pay | Admitting: Family Medicine

## 2022-09-15 ENCOUNTER — Telehealth: Payer: Self-pay

## 2022-09-15 NOTE — Telephone Encounter (Signed)
Initiated Prior authorization HQI:[id number] 2.5MG /0.5ML pen-injectors Via: Covermymeds Case/Key:[id number]  Status: denied as of 09/15/22 Reason:This medication is excluded from the patient's benefit. Notified Pt via: Mychart

## 2022-09-15 NOTE — Telephone Encounter (Signed)
See other MyChart message

## 2022-09-27 ENCOUNTER — Encounter: Payer: Self-pay | Admitting: Family Medicine

## 2022-11-01 ENCOUNTER — Other Ambulatory Visit: Payer: Self-pay | Admitting: Family Medicine

## 2022-11-01 DIAGNOSIS — Z1231 Encounter for screening mammogram for malignant neoplasm of breast: Secondary | ICD-10-CM

## 2022-11-10 NOTE — Progress Notes (Unsigned)
Rubin Payor, PhD, LAT, ATC acting as a scribe for [name], MD.  Stacy Preston is a 62 y.o. female who presents to [facility] at [facility] today for cont'd R knee pain. Pt was last seen by Dr. Denyse Amass on 07/30/22 and she was given a R knee steroid injection and advised to continue to advance activity as tolerated.  Today, pt reports R knee started to return around Aug. She describes the pain to be similar to when she tore her meniscus. Swelling is present, somewhat improved.   Dx imaging: 07/30/22 R knee XR 08/09/20 R knee MRI             08/03/20 R knee XR             09/26/18 R knee XR  Pertinent review of systems: No fevers or chills  Relevant historical information: IBS.  History of meniscus tear right knee.   Exam:  BP 138/82   Pulse 75   Ht 5\' 3"  (1.6 m)   Wt 167 lb (75.8 kg)   SpO2 99%   BMI 29.58 kg/m  General: Well Developed, well nourished, and in no acute distress.   MSK: Right knee large effusion.  Normal motion with crepitation.  Tender palpation medial joint line. Very slight medial laxity to MCL stress test. Mildly positive McMurray's test. Intact strength.    Lab and Radiology Results  Procedure: Real-time Ultrasound Guided Injection of right knee joint superior lateral patella space Device: Philips Affiniti 50G/GE Logiq Images permanently stored and available for review in PACS Verbal informed consent obtained.  Discussed risks and benefits of procedure. Warned about infection, bleeding, hyperglycemia damage to structures among others. Patient expresses understanding and agreement Time-out conducted.   Noted no overlying erythema, induration, or other signs of local infection.   Skin prepped in a sterile fashion.   Local anesthesia: Topical Ethyl chloride.   With sterile technique and under real time ultrasound guidance: 40 mg of Kenalog and 2 mL of Marcaine injected into knee joint. Fluid seen entering the joint capsule.    Completed without difficulty   Pain immediately resolved suggesting accurate placement of the medication.   Advised to call if fevers/chills, erythema, induration, drainage, or persistent bleeding.   Images permanently stored and available for review in the ultrasound unit.  Impression: Technically successful ultrasound guided injection.   EXAM: RIGHT KNEE 3 VIEWS   COMPARISON:  None Available.   FINDINGS: No fracture or dislocation. Minimal degenerative change in the medial and patellofemoral compartments. No joint effusion. No other abnormalities.   IMPRESSION: Minimal degenerative change in the medial and patellofemoral compartments. No other abnormalities.     Electronically Signed   By: Gerome Sam III M.D.   On: 08/01/2022 16:34   Impression for right knee MRI dated August 09, 2020 IMPRESSION: Radial tear of the posterior root of the medial meniscus with 5 mm extrusion. Additional horizontal degenerative tearing throughout the posterior horn and body of the medial meniscus.   Moderate medial compartment chondrosis with focal fissuring of the weight-bearing medial femoral condyle.   Mild lateral compartment chondrosis with 3 mm shallow chondral defect in the posterior weight-bearing lateral femoral condyle.   Partial-thickness cartilage loss of the medial patellar facet.   Small joint effusion.  Assessment and Plan: 62 y.o. female with right knee pain thought to be due to exacerbation of underlying DJD and degenerative meniscus tearing.  She had an MRI about 2 years ago which did  show a medial meniscus tear.  Ultimately she had a surgical consultation and they decided to proceed with conservative management.  Symptoms worsened about 3 months ago after a fall.  She had a steroid injection in June which provided pain relief for about 2 months.  We discussed options.  Plan for steroid injection today.  Will work on authorization for hyaluronic acid injections and  Zilretta injections as is likely today's injection will not last 3 months.  Additionally will investigate medial off loader knee brace.  Her laxity and DJD would both benefit from this knee bracing.  Ultimately if worsening or not improving consider repeat MRI.  She is in the gray zone where a meniscus surgery probably is not going to be very helpful but she is probably not bad enough to have a knee replacement.  Repeat MRI could help clarify that.   PDMP not reviewed this encounter. Orders Placed This Encounter  Procedures   [place] LIMITED JOINT SPACE STRUCTURES LOW RIGHT(NO LINKED CHARGES)    Order Specific Question:   Reason for Exam (SYMPTOM  OR DIAGNOSIS REQUIRED)    Answer:   right knee pain    Order Specific Question:   Preferred imaging location?    Answer:   [facility]   No orders of the defined types were placed in this encounter.    Discussed warning signs or symptoms. Please see discharge instructions. Patient expresses understanding.   The above documentation has been reviewed and is accurate and complete [name], M.D.

## 2022-11-11 ENCOUNTER — Ambulatory Visit: Payer: Managed Care, Other (non HMO) | Admitting: Family Medicine

## 2022-11-11 ENCOUNTER — Other Ambulatory Visit: Payer: Self-pay

## 2022-11-11 VITALS — BP 138/82 | HR 75 | Ht 63.0 in | Wt 167.0 lb

## 2022-11-11 DIAGNOSIS — G8929 Other chronic pain: Secondary | ICD-10-CM

## 2022-11-11 DIAGNOSIS — M1711 Unilateral primary osteoarthritis, right knee: Secondary | ICD-10-CM

## 2022-11-11 DIAGNOSIS — M25561 Pain in right knee: Secondary | ICD-10-CM

## 2022-11-11 NOTE — Patient Instructions (Addendum)
Thank you for coming in today.   You received an injection today. Seek immediate medical attention if the joint becomes red, extremely painful, or is oozing fluid.   We will work to authorize gel shots and [employer]

## 2022-11-15 ENCOUNTER — Telehealth: Payer: Self-pay

## 2022-11-15 NOTE — Telephone Encounter (Signed)
-----   Message from Adron Bene sent at 11/11/2022  9:22 AM EDT ----- Regarding: Berkley Harvey Please auth [employer]co and zilretta R knee

## 2022-11-15 NOTE — Telephone Encounter (Signed)
-----   Message from Adron Bene sent at 11/11/2022  9:22 AM EDT ----- Regarding: Stacy Preston Please auth visco and zilretta R knee

## 2022-11-15 NOTE — Telephone Encounter (Signed)
VOB initiated for ZILRETTA for RIGHT knee OA.

## 2022-11-15 NOTE — Telephone Encounter (Signed)
VOB initiated for GELSYN for RIGHT knee OA.

## 2022-11-16 ENCOUNTER — Encounter: Payer: Self-pay | Admitting: Family Medicine

## 2022-11-16 DIAGNOSIS — G8929 Other chronic pain: Secondary | ICD-10-CM

## 2022-11-16 NOTE — Telephone Encounter (Signed)
Per visit note 11/11/22:  Assessment and Plan: 62 y.o. female with right knee pain thought to be due to exacerbation of underlying DJD and degenerative meniscus tearing.  She had an MRI about 2 years ago which did show a medial meniscus tear.  Ultimately she had a surgical consultation and they decided to proceed with conservative management.  Symptoms worsened about 3 months ago after a fall.  She had a steroid injection in June which provided pain relief for about 2 months.  We discussed options.  Plan for steroid injection today.  Will work on authorization for hyaluronic acid injections and Zilretta injections as is likely today's injection will not last 3 months.  Additionally will investigate medial off loader knee brace.  Her laxity and DJD would both benefit from this knee bracing.   Ultimately if worsening or not improving consider repeat MRI.  She is in the gray zone where a meniscus surgery probably is not going to be very helpful but she is probably not bad enough to have a knee replacement.  Repeat MRI could help clarify that.

## 2022-11-18 NOTE — Telephone Encounter (Signed)
Prior Auth REQUIRED for Stacy Preston

## 2022-11-18 NOTE — Telephone Encounter (Signed)
Prior Auth REQUIRED for [employer] for RIGHT knee OA

## 2022-11-21 ENCOUNTER — Ambulatory Visit: Payer: Managed Care, Other (non HMO)

## 2022-11-21 DIAGNOSIS — M25561 Pain in right knee: Secondary | ICD-10-CM | POA: Diagnosis not present

## 2022-11-21 DIAGNOSIS — G8929 Other chronic pain: Secondary | ICD-10-CM | POA: Diagnosis not present

## 2022-11-23 NOTE — Telephone Encounter (Signed)
Prior Auth initiated for [employer] via [employer] Prior Auth (EOC) ID:  [id number]

## 2022-11-23 NOTE — Telephone Encounter (Signed)
Prior Auth initiated for [facility] for RIGHT knee OA via [employer] Prior Auth (EOC) ID:  [id number]

## 2022-11-25 ENCOUNTER — Encounter: Payer: Self-pay | Admitting: Family Medicine

## 2022-11-30 NOTE — Telephone Encounter (Signed)
ZILRETTA for RIGHT knee OA   Primary Insurance: [employer] Open Access Co-pay: $50 Co-insurance: 0% Deductible: does not apply Prior Auth: APPROVED APPROVED PA # [id number] / [id number] Valid: 11/23/22-12/24/22    Knee Injection History 03/07/20 - Kenalog LEFT 08/04/20 - Kenalog RIGHT 01/21/21 - Kenalog RIGHT 05/18/21 - Kenalog RIGHT 07/30/22 - Kenalog RIGHT 11/11/22 - Kenalog RIGHT

## 2022-11-30 NOTE — Telephone Encounter (Signed)
Prior Auth for [place]  APPROVED PA # [id number] / [id number] Valid: 11/23/22-12/24/22

## 2022-12-02 ENCOUNTER — Encounter: Payer: Self-pay | Admitting: Family Medicine

## 2022-12-02 NOTE — Telephone Encounter (Signed)
Patient is going to do some research on the injection and contact us to schedule if she would like to proceed.

## 2022-12-02 NOTE — Progress Notes (Signed)
The main finding on this MRI is worsening areas of cartilage and some bone bruising in the weightbearing surfaces of the knee.  These are points 3 and 4 in the impression section on the MRI report.  You do have worsening meniscus tears but I do not think a meniscus surgery is likely to help. You probably need a knee replacement at some point in the not-too-distant future. We do have Zilretta authorized that we could try in the knee.  This probably would help a fair amount.  Please let me know if you would like to proceed with that injection so we can make sure we have it ordered in for you.  We can talk about the results of the MRI in full detail and proceed with the injection in the near future if you would like.

## 2022-12-05 ENCOUNTER — Other Ambulatory Visit: Payer: Self-pay | Admitting: Gastroenterology

## 2022-12-07 NOTE — Telephone Encounter (Signed)
Gelsyn for RIGHT knee OA APPROVED PA# [id number] Valid: 11/23/22-05/24/23

## 2022-12-07 NOTE — Telephone Encounter (Signed)
GELSYN for RIGHT knee OA  Also checked Zilretta  Primary Insurance: [employer]  Co-pay: $50 per visit Co-insurance: 0% Deductible: does not apply Prior Auth: APPROVED G[employer] for RIGHT knee OA APPROVED PA# [id number] Valid: 11/23/22-05/24/23   Knee Injection History 03/07/20 - Kenalog LEFT 08/04/20 - Kenalog RIGHT 01/21/21 - Kenalog RIGHT 05/18/21 - Kenalog RIGHT 07/30/22 - Kenalog RIGHT 11/11/22 - Kenalog RIGHT

## 2022-12-07 NOTE — Telephone Encounter (Signed)
Zilretta scheduled 10/17.

## 2022-12-08 NOTE — Progress Notes (Unsigned)
   Rubin Payor, PhD, LAT, ATC acting as a scribe for [name], MD.  Stacy Preston is a 62 y.o. female who presents to [facility] at [facility] today for f/u R knee pain w/ MRI review. Pt was last seen by Dr. Denyse Amass on 11/11/22 and was given a R knee steroid injection and advised we would work to authorize gel shots and Zilretta. [employer] rep was also contacted to consider a medial off-loader knee brace. Pt sent a MyChart message on 9/24 and a MRI was ordered.   Today, pt reports ***  Dx imaging: 11/21/22 R knee MRI 07/30/22 R knee XR 08/09/20 R knee MRI             08/03/20 R knee XR             09/26/18 R knee XR  Pertinent review of systems: ***  Relevant historical information: ***   Exam:  There were no vitals taken for this visit. General: Well Developed, well nourished, and in no acute distress.   MSK: ***    Lab and Radiology Results No results found for this or any previous visit (from the past 72 hour(s)). No results found.     Assessment and Plan: 62 y.o. female with ***   PDMP not reviewed this encounter. No orders of the defined types were placed in this encounter.  No orders of the defined types were placed in this encounter.    Discussed warning signs or symptoms. Please see discharge instructions. Patient expresses understanding.   ***

## 2022-12-09 ENCOUNTER — Encounter: Payer: Self-pay | Admitting: Family Medicine

## 2022-12-09 ENCOUNTER — Ambulatory Visit: Payer: Managed Care, Other (non HMO) | Admitting: Family Medicine

## 2022-12-09 ENCOUNTER — Other Ambulatory Visit: Payer: Self-pay

## 2022-12-09 VITALS — BP 122/80 | HR 75 | Ht 63.0 in | Wt 168.0 lb

## 2022-12-09 DIAGNOSIS — M1711 Unilateral primary osteoarthritis, right knee: Secondary | ICD-10-CM | POA: Diagnosis not present

## 2022-12-09 DIAGNOSIS — M25561 Pain in right knee: Secondary | ICD-10-CM | POA: Diagnosis not present

## 2022-12-09 DIAGNOSIS — G8929 Other chronic pain: Secondary | ICD-10-CM

## 2022-12-09 MED ORDER — TRIAMCINOLONE ACETONIDE 32 MG IX SRER
32.0000 mg | Freq: Once | INTRA_ARTICULAR | Status: AC
Start: 2022-12-09 — End: 2022-12-09
  Administered 2022-12-09: 32 mg via INTRA_ARTICULAR

## 2022-12-09 NOTE — Patient Instructions (Signed)
Thank you for coming in today.   Call or go to the ER if you develop a large red swollen joint with extreme pain or oozing puss.    Use the offloader brace.   Let me know how this goes.   I can refer to orthopedics if needed in the future.

## 2022-12-10 ENCOUNTER — Ambulatory Visit: Payer: Managed Care, Other (non HMO) | Admitting: Family Medicine

## 2022-12-14 NOTE — Telephone Encounter (Signed)
Pt opted to proceed with Zilretta injection.   Per visit note 12/09/22:  Assessment and Plan: 62 y.o. female with right knee pain due to predominantly DJD.  She does have meniscus tear and degeneration which I am sure is contributory but the main issue is knee arthritis.  Plan for Zilretta injection today.  Continue off loader knee brace which has been helpful.  If this is not working would consider surgical consultation.  Ultimately I think she will need a knee replacement.

## 2022-12-27 ENCOUNTER — Ambulatory Visit: Payer: Managed Care, Other (non HMO)

## 2022-12-28 NOTE — Telephone Encounter (Signed)
Pt received Zilretta inj for RIGHT knee OA 12/09/22. Can consider repeat injection on or after 03/04/23

## 2022-12-29 ENCOUNTER — Ambulatory Visit
Admission: RE | Admit: 2022-12-29 | Discharge: 2022-12-29 | Disposition: A | Discharge status: Home / Self Care | Payer: Managed Care, Other (non HMO) | Source: Ambulatory Visit | Attending: Family Medicine | Admitting: Family Medicine

## 2022-12-29 DIAGNOSIS — Z1231 Encounter for screening mammogram for malignant neoplasm of breast: Secondary | ICD-10-CM

## 2023-01-12 ENCOUNTER — Encounter: Payer: Self-pay | Admitting: Family Medicine

## 2023-01-12 ENCOUNTER — Ambulatory Visit (INDEPENDENT_AMBULATORY_CARE_PROVIDER_SITE_OTHER): Payer: Managed Care, Other (non HMO) | Admitting: Family Medicine

## 2023-01-12 VITALS — BP 134/78 | HR 71 | Ht 63.0 in | Wt 172.0 lb

## 2023-01-12 DIAGNOSIS — R252 Cramp and spasm: Secondary | ICD-10-CM

## 2023-01-12 DIAGNOSIS — Z0000 Encounter for general adult medical examination without abnormal findings: Secondary | ICD-10-CM

## 2023-01-12 DIAGNOSIS — E559 Vitamin D deficiency, unspecified: Secondary | ICD-10-CM | POA: Diagnosis not present

## 2023-01-12 DIAGNOSIS — Z13220 Encounter for screening for lipoid disorders: Secondary | ICD-10-CM

## 2023-01-12 DIAGNOSIS — F411 Generalized anxiety disorder: Secondary | ICD-10-CM

## 2023-01-12 NOTE — Patient Instructions (Signed)
Preventive Care 40-64 Years Old, Female Preventive care refers to lifestyle choices and visits with your health care provider that can promote health and wellness. Preventive care visits are also called wellness exams. What can I expect for my preventive care visit? Counseling Your health care provider may ask you questions about your: Medical history, including: Past medical problems. Family medical history. Pregnancy history. Current health, including: Menstrual cycle. Method of birth control. Emotional well-being. Home life and relationship well-being. Sexual activity and sexual health. Lifestyle, including: Alcohol, nicotine or tobacco, and drug use. Access to firearms. Diet, exercise, and sleep habits. Work and work environment. Sunscreen use. Safety issues such as seatbelt and bike helmet use. Physical exam Your health care provider will check your: Height and weight. These may be used to calculate your BMI (body mass index). BMI is a measurement that tells if you are at a healthy weight. Waist circumference. This measures the distance around your waistline. This measurement also tells if you are at a healthy weight and may help predict your risk of certain diseases, such as type 2 diabetes and high blood pressure. Heart rate and blood pressure. Body temperature. Skin for abnormal spots. What immunizations do I need?  Vaccines are usually given at various ages, according to a schedule. Your health care provider will recommend vaccines for you based on your age, medical history, and lifestyle or other factors, such as travel or where you work. What tests do I need? Screening Your health care provider may recommend screening tests for certain conditions. This may include: Lipid and cholesterol levels. Diabetes screening. This is done by checking your blood sugar (glucose) after you have not eaten for a while (fasting). Pelvic exam and Pap test. Hepatitis B test. Hepatitis C  test. HIV (human immunodeficiency virus) test. STI (sexually transmitted infection) testing, if you are at risk. Lung cancer screening. Colorectal cancer screening. Mammogram. Talk with your health care provider about when you should start having regular mammograms. This may depend on whether you have a family history of breast cancer. BRCA-related cancer screening. This may be done if you have a family history of breast, ovarian, tubal, or peritoneal cancers. Bone density scan. This is done to screen for osteoporosis. Talk with your health care provider about your test results, treatment options, and if necessary, the need for more tests. Follow these instructions at home: Eating and drinking  Eat a diet that includes fresh fruits and vegetables, whole grains, lean protein, and low-fat dairy products. Take vitamin and mineral supplements as recommended by your health care provider. Do not drink alcohol if: Your health care provider tells you not to drink. You are pregnant, may be pregnant, or are planning to become pregnant. If you drink alcohol: Limit how much you have to 0-1 drink a day. Know how much alcohol is in your drink. In the [place] one drink equals one 12 oz bottle of beer (355 mL), one 5 oz glass of wine (148 mL), or one 1 oz glass of hard liquor (44 mL). Lifestyle Brush your teeth every morning and night with fluoride toothpaste. Floss one time each day. Exercise for at least 30 minutes 5 or more days each week. Do not use any products that contain nicotine or tobacco. These products include cigarettes, chewing tobacco, and vaping devices, such as e-cigarettes. If you need help quitting, ask your health care provider. Do not use drugs. If you are sexually active, practice safe sex. Use a condom or other form of protection to   prevent STIs. If you do not wish to become pregnant, use a form of birth control. If you plan to become pregnant, see your health care provider for a  prepregnancy visit. Take aspirin only as told by your health care provider. Make sure that you understand how much to take and what form to take. Work with your health care provider to find out whether it is safe and beneficial for you to take aspirin daily. Find healthy ways to manage stress, such as: Meditation, yoga, or listening to music. Journaling. Talking to a trusted person. Spending time with friends and family. Minimize exposure to UV radiation to reduce your risk of skin cancer. Safety Always wear your seat belt while driving or riding in a vehicle. Do not drive: If you have been drinking alcohol. Do not ride with someone who has been drinking. When you are tired or distracted. While texting. If you have been using any mind-altering substances or drugs. Wear a helmet and other protective equipment during sports activities. If you have firearms in your house, make sure you follow all gun safety procedures. Seek help if you have been physically or sexually abused. What's next? Visit your health care provider once a year for an annual wellness visit. Ask your health care provider how often you should have your eyes and teeth checked. Stay up to date on all vaccines. This information is not intended to replace advice given to you by your health care provider. Make sure you discuss any questions you have with your health care provider. Document Revised: 08/06/2020 Document Reviewed: 08/06/2020 Elsevier Patient Education  2024 Elsevier Inc.  

## 2023-01-12 NOTE — Assessment & Plan Note (Signed)
Referral placed for counseling

## 2023-01-12 NOTE — Assessment & Plan Note (Signed)
Well adult Orders Placed This Encounter  Procedures   CMP14+EGFR   CBC with Differential/Platelet   Lipid Panel With LDL/HDL Ratio   Vitamin D (25 hydroxy)   TSH   Magnesium   Ambulatory referral to Psychology    Referral Priority:   Routine    Referral Type:   Psychiatric    Referral Reason:   Specialty Services Required    Requested Specialty:   Psychology    Number of Visits Requested:   1  Screenings: UTD Immunizations: UTD Anticipatory guidance/Risk factor reduction:  Recommendations per AVS.

## 2023-01-12 NOTE — Progress Notes (Signed)
Stacy Preston - 62 y.o. female MRN [id number]  Date of birth: 03-29-1960  Subjective Chief Complaint  Patient presents with   Annual Exam   Depression    HPI Stacy Preston is a 62 y.o. female here today for annual exam.   She reports that she is having increased anxiety.  She admits to having increased stress recently.  Recently found out that her daughter is pregnant.  She is concerned because her daughter recently lost her job and is not very responsible.  She worries about her daughter and her future child.  She would like to see a therapist.   Her activity level is moderate.  She feels that her diet is ok most of the time.   She is a non-smoker.  Occasional EtOH   Review of Systems  Constitutional:  Negative for chills, fever, malaise/fatigue and weight loss.  HENT:  Negative for congestion, ear pain and sore throat.   Eyes:  Negative for blurred vision, double vision and pain.  Respiratory:  Negative for cough and shortness of breath.   Cardiovascular:  Negative for chest pain and palpitations.  Gastrointestinal:  Negative for abdominal pain, blood in stool, constipation, heartburn and nausea.  Genitourinary:  Negative for dysuria and urgency.  Musculoskeletal:  Negative for joint pain and myalgias.  Neurological:  Negative for dizziness and headaches.  Endo/Heme/Allergies:  Does not bruise/bleed easily.  Psychiatric/Behavioral:  Negative for depression. The patient is not nervous/anxious and does not have insomnia.     Allergies  Allergen Reactions   Lactose Intolerance (Gi)     Past Medical History:  Diagnosis Date   Allergy    History of basal cell carcinoma 01/09/2016   Hypertension    Infertility, female    Post-operative nausea and vomiting    Prolactinoma (HCC)    Vitamin D deficiency 01/09/2016    Past Surgical History:  Procedure Laterality Date   BREAST BIOPSY Right 12/29/2020   COLONOSCOPY  2013, 2017   [place]   INSERTION OF MESH N/A  07/21/2022   Procedure: INSERTION OF MESH;  Surgeon: Griselda Miner, MD;  Location: [facility];  Service: General;  Laterality: N/A;   ivf     LAPAROSCOPY     for endometriosis   PLANTAR FASCIA RELEASE     PLANTAR FASCIA SURGERY     UMBILICAL HERNIA REPAIR N/A 07/21/2022   Procedure: HERNIA REPAIR UMBILICAL ADULT WITH POSSIBLE MESH;  Surgeon: Griselda Miner, MD;  Location: [facility];  Service: General;  Laterality: N/A;    Social History   Socioeconomic History   Marital status: Married    Spouse name: Not on file   Number of children: Not on file   Years of education: Not on file   Highest education level: Not on file  Occupational History   Not on file  Tobacco Use   Smoking status: Never   Smokeless tobacco: Never  Vaping Use   Vaping status: Never Used  Substance and Sexual Activity   Alcohol use: Yes    Alcohol/week: 2.0 standard drinks of alcohol    Types: 2 Standard drinks or equivalent per week   Drug use: No   Sexual activity: Yes    Birth control/protection: None  Other Topics Concern   Not on file  Social History Narrative   Not on file   Social Determinants of Health   Financial Resource Strain: Not on file  Food Insecurity: Not on file  Transportation Needs: Not on file  Physical Activity: Insufficiently Active (11/23/2017)   Exercise Vital Sign    Days of Exercise per Week: 1 day    Minutes of Exercise per Session: 40 min  Stress: Not on file  Social Connections: Unknown (07/07/2021)   [facility]   Social Network    Social Network: Not on file    Family History  Problem Relation Age of Onset   Renal Disease Mother    Diabetes Mother    Hypertension Mother    Cancer Father        Lung CA   Alcohol abuse Brother    Hypertension Brother    Sickle cell anemia Brother    Cancer Brother    Colon polyps Brother    Diabetes Maternal Aunt    Heart disease Maternal Grandmother     Diabetes Maternal Grandmother    Cancer Maternal Grandfather    Diabetes Maternal Grandfather    Colon cancer Maternal Grandfather    Stroke Paternal Grandmother    Heart disease Paternal Grandfather    Esophageal cancer Neg Hx    Rectal cancer Neg Hx    Stomach cancer Neg Hx     Health Maintenance  Topic Date Due   Zoster Vaccines- Shingrix (1 of 2) 04/14/2023 (Originally 09/07/1979)   COVID-19 Vaccine (4 - 2023-24 season) 04/27/2023 (Originally 10/24/2022)   INFLUENZA VACCINE  05/23/2023 (Originally 09/23/2022)   MAMMOGRAM  12/28/2024   Colonoscopy  08/05/2025   DTaP/Tdap/Td (2 - Td or Tdap) 01/08/2026   Cervical Cancer Screening (HPV/Pap Cotest)  01/20/2027   Hepatitis C Screening  Completed   HIV Screening  Completed   HPV VACCINES  Aged Out     ----------------------------------------------------------------------------------------------------------------------------------------------------------------------------------------------------------------- Physical Exam BP (!) 143/80 (BP Location: Left Arm, Patient Position: Sitting, Cuff Size: Large)   Pulse 71   Ht 5\' 3"  (1.6 m)   Wt 172 lb (78 kg)   SpO2 100%   BMI 30.47 kg/m   Physical Exam Constitutional:      General: She is not in acute distress. HENT:     Head: Normocephalic and atraumatic.     Right Ear: Tympanic membrane and ear canal normal.     Left Ear: Tympanic membrane and ear canal normal.     Nose: Nose normal.  Eyes:     General: No scleral icterus.    Conjunctiva/sclera: Conjunctivae normal.  Neck:     Thyroid: No thyromegaly.  Cardiovascular:     Rate and Rhythm: Normal rate and regular rhythm.     Heart sounds: Normal heart sounds.  Pulmonary:     Effort: Pulmonary effort is normal.     Breath sounds: Normal breath sounds.  Abdominal:     General: Bowel sounds are normal. There is no distension.     Palpations: Abdomen is soft.     Tenderness: There is no abdominal tenderness. There is no  guarding.  Musculoskeletal:        General: Normal range of motion.     Cervical back: Normal range of motion and neck supple.  Lymphadenopathy:     Cervical: No cervical adenopathy.  Skin:    General: Skin is warm and dry.     Findings: No rash.  Neurological:     General: No focal deficit present.     Mental Status: She is alert and oriented to person, place, and time.     Cranial Nerves: No cranial nerve deficit.  Coordination: Coordination normal.  Psychiatric:        Mood and Affect: Mood normal.        Behavior: Behavior normal.     ------------------------------------------------------------------------------------------------------------------------------------------------------------------------------------------------------------------- Assessment and Plan  Well adult exam Well adult Orders Placed This Encounter  Procedures   CMP14+EGFR   CBC with Differential/Platelet   Lipid Panel With LDL/HDL Ratio   Vitamin D (25 hydroxy)   TSH   Magnesium   Ambulatory referral to Psychology    Referral Priority:   Routine    Referral Type:   Psychiatric    Referral Reason:   Specialty Services Required    Requested Specialty:   Psychology    Number of Visits Requested:   1  Screenings: UTD Immunizations: UTD Anticipatory guidance/Risk factor reduction:  Recommendations per AVS.    GAD (generalized anxiety disorder) Referral placed for counseling.    No orders of the defined types were placed in this encounter.   No follow-ups on file.    This visit occurred during the SARS-CoV-2 public health emergency.  Safety protocols were in place, including screening questions prior to the visit, additional usage of staff PPE, and extensive cleaning of exam room while observing appropriate contact time as indicated for disinfecting solutions.

## 2023-01-13 LAB — CMP14+EGFR
ALT: 18 [IU]/L (ref 0–32)
AST: 20 [IU]/L (ref 0–40)
Albumin: 4.2 g/dL (ref 3.9–4.9)
Alkaline Phosphatase: 72 [IU]/L (ref 44–121)
BUN/Creatinine Ratio: 21 (ref 12–28)
BUN: 17 mg/dL (ref 8–27)
Bilirubin Total: 0.6 mg/dL (ref 0.0–1.2)
CO2: 25 mmol/L (ref 20–29)
Calcium: 8.7 mg/dL (ref 8.7–10.3)
Chloride: 105 mmol/L (ref 96–106)
Creatinine, Ser: 0.81 mg/dL (ref 0.57–1.00)
Globulin, Total: 2.4 g/dL (ref 1.5–4.5)
Glucose: 96 mg/dL (ref 70–99)
Potassium: 4.6 mmol/L (ref 3.5–5.2)
Sodium: 141 mmol/L (ref 134–144)
Total Protein: 6.6 g/dL (ref 6.0–8.5)
eGFR: 82 mL/min/{1.73_m2} (ref >59)

## 2023-01-13 LAB — CBC WITH DIFFERENTIAL/PLATELET
Basophils Absolute: 0.1 10*3/uL (ref 0.0–0.2)
Basos: 1 %
EOS (ABSOLUTE): 0.2 10*3/uL (ref 0.0–0.4)
Eos: 3 %
Hematocrit: 41.7 % (ref 34.0–46.6)
Hemoglobin: 12.9 g/dL (ref 11.1–15.9)
Immature Grans (Abs): 0 10*3/uL (ref 0.0–0.1)
Immature Granulocytes: 0 %
Lymphocytes Absolute: 1.6 10*3/uL (ref 0.7–3.1)
Lymphs: 27 %
MCH: 29.2 pg (ref 26.6–33.0)
MCHC: 30.9 g/dL — ABNORMAL LOW (ref 31.5–35.7)
MCV: 94 fL (ref 79–97)
Monocytes Absolute: 0.5 10*3/uL (ref 0.1–0.9)
Monocytes: 8 %
Neutrophils Absolute: 3.5 10*3/uL (ref 1.4–7.0)
Neutrophils: 61 %
Platelets: 259 10*3/uL (ref 150–450)
RBC: 4.42 x10E6/uL (ref 3.77–5.28)
RDW: 12.2 % (ref 11.7–15.4)
WBC: 5.9 10*3/uL (ref 3.4–10.8)

## 2023-01-13 LAB — LIPID PANEL WITH LDL/HDL RATIO
Cholesterol, Total: 161 mg/dL (ref 100–199)
HDL: 78 mg/dL (ref >39)
LDL Chol Calc (NIH): 72 mg/dL (ref 0–99)
LDL/HDL Ratio: 0.9 ratio (ref 0.0–3.2)
Triglycerides: 50 mg/dL (ref 0–149)
VLDL Cholesterol Cal: 11 mg/dL (ref 5–40)

## 2023-01-13 LAB — VITAMIN D 25 HYDROXY (VIT D DEFICIENCY, FRACTURES): Vit D, 25-Hydroxy: 52.9 ng/mL (ref 30.0–100.0)

## 2023-01-13 LAB — MAGNESIUM: Magnesium: 1.8 mg/dL (ref 1.6–2.3)

## 2023-01-13 LAB — TSH: TSH: 1.37 u[IU]/mL (ref 0.450–4.500)

## 2023-01-28 ENCOUNTER — Other Ambulatory Visit: Payer: Self-pay | Admitting: Family Medicine

## 2023-01-28 ENCOUNTER — Encounter: Payer: Self-pay | Admitting: Family Medicine

## 2023-02-10 ENCOUNTER — Ambulatory Visit: Payer: 59 | Admitting: Behavioral Health

## 2023-02-10 ENCOUNTER — Ambulatory Visit: Payer: Managed Care, Other (non HMO) | Admitting: Podiatry

## 2023-02-10 ENCOUNTER — Encounter: Payer: Self-pay | Admitting: Family Medicine

## 2023-02-10 ENCOUNTER — Ambulatory Visit: Payer: Managed Care, Other (non HMO)

## 2023-02-10 ENCOUNTER — Other Ambulatory Visit: Payer: Self-pay

## 2023-02-10 DIAGNOSIS — M722 Plantar fascial fibromatosis: Secondary | ICD-10-CM

## 2023-02-10 DIAGNOSIS — M79672 Pain in left foot: Secondary | ICD-10-CM

## 2023-02-10 DIAGNOSIS — F411 Generalized anxiety disorder: Secondary | ICD-10-CM

## 2023-02-10 MED ORDER — TRIAMCINOLONE ACETONIDE 10 MG/ML IJ SUSP: 2.5000 mg | Freq: Once | INTRAMUSCULAR | Status: AC

## 2023-02-10 MED ORDER — DEXAMETHASONE SODIUM PHOSPHATE 120 MG/30ML IJ SOLN: 4.0000 mg | Freq: Once | INTRAMUSCULAR | Status: AC

## 2023-02-10 MED ORDER — MELOXICAM 15 MG PO TABS: 15.0000 mg | ORAL_TABLET | Freq: Every day | ORAL | 0 refills | Status: DC

## 2023-02-10 NOTE — Progress Notes (Signed)
  Subjective:  Patient ID: Stacy Preston, female    DOB: 04/24/1960,   MRN: [id number]  No chief complaint on file.   62 y.o. female presents for concern of bilateral foot pain that has been ongoing for about a year. Relates the bottom of the heel is where the pain occurs and especially first steps in the morning. Relates the left foot is worse than the right.  She relates her heels get painful . She has had plantar fasciitis in the past and had topaz and PRP procedure. She relates she currently has not tried any treatments. . Denies any other pedal complaints. Denies n/v/f/c.   Past Medical History:  Diagnosis Date   Allergy    History of basal cell carcinoma 01/09/2016   Hypertension    Infertility, female    Post-operative nausea and vomiting    Prolactinoma (HCC)    Vitamin D deficiency 01/09/2016    Objective:  Physical Exam: Vascular: DP/PT pulses 2/4 bilateral. CFT <3 seconds. Normal hair growth on digits. No edema.  Skin. No lacerations or abrasions bilateral feet.  Musculoskeletal: MMT 5/5 bilateral lower extremities in DF, PF, Inversion and Eversion. Deceased ROM in DF of ankle joint. Tender to the medial calcaneal tubercle bilaterally . No pain with achilles, PT or arch. No pain with calcaneal squeeze. More tender on left  Neurological: Sensation intact to light touch.   Assessment:   1. Plantar fasciitis, bilateral      Plan:  Patient was evaluated and treated and all questions answered. Discussed plantar fasciitis with patient.  X-rays reviewed and discussed with patient. No acute fractures or dislocations noted. Mild spurring noted at inferior calcaneus.  Discussed treatment options including, ice, NSAIDS, supportive shoes, bracing, and stretching. Stretching exercises provided to be done on a daily basis.   Prescription for meloxicam provided and sent to pharmacy. Patient requesting injection today. Procedure note below.   Pf brace dispensed.  Follow-up 6  weeks or sooner if any problems arise. In the meantime, encouraged to call the office with any questions, concerns, change in symptoms.   Procedure:  Discussed etiology, pathology, conservative vs. surgical therapies. At this time a plantar fascial injection was recommended.  The patient agreed and a sterile skin prep was applied.  An injection consisting of  1cc dexamethasone 0.5 cc kenalog and 1cc marcaine mixture was infiltrated at the point of maximal tenderness on the left Heel.  Bandaid applied. The patient tolerated this well and was given instructions for aftercare.    Louann Sjogren, DPM

## 2023-02-10 NOTE — Patient Instructions (Signed)

## 2023-02-11 ENCOUNTER — Encounter: Payer: Self-pay | Admitting: Behavioral Health

## 2023-02-11 ENCOUNTER — Ambulatory Visit: Payer: 59 | Admitting: Professional

## 2023-02-11 NOTE — Progress Notes (Signed)
[facility] Behavioral Health Counselor Initial Adult Exam  Name: Stacy Preston Date: 02/11/2023 MRN: [id number] DOB: [birth year] PCP: Everrett Coombe, DO  Time spent: 60 minutes, 3 PM until 4 PM This session was held via video teletherapy. The patient consented to the video teletherapy and was located in her home during this session. She is aware it is the responsibility of the patient to secure confidentiality on her end of the session. The provider was in a private home office for the duration of this session.      Guardian/Payee: Self  Paperwork requested: No   Reason for Visit /Presenting Problem: Stress/anxiety primarily related to daughter  Kriste Basque is a 2 year old married female.  She has been married to her husband dog for 30 years and reports a very good relationship.  She has been married previously for the first time at age 17 for 2 years.  He was unfaithful to her.  She married again and was married for 5 years and he too was unfaithful.  She and her husband were not able to have any biological children.  They did initially foster their daughter Aggie Cosier who had 2 older siblings.  2 other local families fostered her daughters to older siblings and all of the families spent time together allowing the children to see each other on a consistent basis.  They would also do respite care for the daughter's 2 other siblings as needed.  She and her husband moved to [place] in 03-05-2014 for her husband's job.  Her daughter Aggie Cosier had graduated from high school in March 05, 2012 and said that the move to [place] was difficult for her daughter.  The patient and her husband love being in this area and still have family in the [place] area.  Her husband has now changed jobs and is a Electrical engineer for a company out of [place].  The patient works in Presenter, broadcasting for [employer].  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  The patient and her husband have a limited supports in this area.  Both of their parents are deceased.  The patient is originally from [place] and grew up with her biological parents as well as 2 younger brothers.  She had a good relationship with her family but her father died when she was 62 years old.  Her mother was a Engineer, civil (consulting) and raised that she and her 2 siblings.  Her mother was only 79 when she was widowed.  She also had supports growing up from her maternal grandparents as well as a maternal aunt.  They too are deceased.  She does have a maternal uncle who is still alive and lives in the area where she grew up.  Her mother died in 05-Mar-2005.  Her middle brother died in Mar 05, 2021.  The patient and her husband started fostering their daughter Aggie Cosier in 2001 2002.  She is now 63.  Her daughter has 2 older siblings.  The daughter's older brother does have special needs.  Her sister now has significant issues with drugs being homeless etc.  Both the patient's daughter and her siblings have been in and out of foster care until they went to the patient's and others families.  She says that they have had challenges with her daughter questionable since she was a child.  She has been in and out of therapy since childhood.  In Mar 05, 2013 the patient's husband came home to find  the daughter moving out of the house moving in with a guy.  She said that her daughter has always been very impulsive.  She said that her daughter has always sought validation and has been difficult.  The man that she moved out with was a decent guy and she lived with him for 2 years in a house in the [place] area.  The summer of this year her daughter broke up with that man.  She started dating someone almost immediately.  Just before Thanksgiving her daughter told them that she had lost her job at [employer] and that she was pregnant by the guys she had started dating.  She and that the female are no longer together.  The patient  and her husband have had to pay her daughter's expenses a lease for the past month.  The daughter's baby is due in June 2025.  She has talked to her daughter about moving back to [place] which the daughter does not want to do.  She has talked to the daughter about adoption which at this point the daughter is not willing to do.  She is disappointed because when the daughter broke up with the first female the patient asked her specifically about having her IUD put back in which her daughter said she had done.  The patient is very disappointed that her daughter lied to her about something that important.  She is worried that her daughter will not take care of herself being up in that area away from the patient and her husband.  The patient acknowledges significant anxiety and fears about what kind of things might happen.  She knows that her daughter does not make her choices.  While living with the patient's in 2015 she was tested at [facility] and showed traits of borderline personality disorder as well as antisocial disorder and impulsive traits.  The patient said she always had a great relationship with her mom and losing her mom was traumatic.  She was loved to have that type of relationship with her daughter but says it has been difficult at times to make that happen and now her anxiety level loss, based on her daughter being pregnant and not working.  The patient is struggling with how to come to terms with what is taking place in her daughter's life.  She says that as much as a loves her daughter want to support her she and her husband have gone to a place where they were close to retirement when looking forward to slowing down a little bit and do not feel that they are in a place to raise a child.  She is wrestling with at have conversations with daughter and what might be the best options for her daughter if her daughter will be willing to work with them.  She recognizes that her  daughter is an adult and she can make those decisions for her but is concerned because her daughter has a history of not making great choices.  The patient does have some things that she enjoys doing such as repurchasing/painting old furniture, watching series such as [place], taking trips with her husband.  Her husband also plays instruments and plays in some local band so they go out to places to hear him play and have dinner.  Encouraged her to continue to do those things and we will introduce more coping skills in the next session.  She does contract for safety having no thoughts of hurting herself or  anyone else.    Mental Status Exam: Appearance:   Well Groomed     Behavior:  Appropriate  Motor:  Normal  Speech/Language:   Normal Rate  Affect:  Appropriate  Mood:  normal  Thought process:  normal  Thought content:    WNL  Sensory/Perceptual disturbances:    WNL  Orientation:  oriented to person, place, time/date, situation, day of week, month of year, and year  Attention:  Good  Concentration:  Good  Memory:  WNL  Fund of knowledge:   Good  Insight:    Good  Judgment:   Good  Impulse Control:  Good    Reported Symptoms: Stress/anxiety  Risk Assessment: Danger to Self:  No Self-injurious Behavior: No Danger to Others: No Duty to Warn:no Physical Aggression / Violence:No  Access to Firearms a concern: No  Gang Involvement:No  Patient / guardian was educated about steps to take if suicide or homicide risk level increases between visits: n/a While future psychiatric events cannot be accurately predicted, the patient does not currently require acute inpatient psychiatric care and does not currently meet [facility] involuntary commitment criteria.  Substance Abuse History: Current substance abuse: No     Past Psychiatric History:   Previous psychological history is significant for anxiety Outpatient Providers: Primary care physician History of Psych  Hospitalization:  None reported Psychological Testing:  n/a    Abuse History:  Victim of: No.,  n/a    Report needed: No. Victim of Neglect:No. Perpetrator of none reported  Witness / Exposure to Domestic Violence: None reported  Protective Services Involvement: No  Witness to [employer] Violence:  No   Family History:  Family History  Problem Relation Age of Onset   Renal Disease Mother    Diabetes Mother    Hypertension Mother    Cancer Father        Lung CA   Alcohol abuse Brother    Hypertension Brother    Sickle cell anemia Brother    Cancer Brother    Colon polyps Brother    Diabetes Maternal Aunt    Heart disease Maternal Grandmother    Diabetes Maternal Grandmother    Cancer Maternal Grandfather    Diabetes Maternal Grandfather    Colon cancer Maternal Grandfather    Stroke Paternal Grandmother    Heart disease Paternal Grandfather    Esophageal cancer Neg Hx    Rectal cancer Neg Hx    Stomach cancer Neg Hx     Living situation: the patient lives with their spouse  Sexual Orientation: Straight  Relationship Status: married  Name of spouse / other: Gala Romney If a parent, number of children / ages: The patient has a 30 year old adoptive daughter named [employer]: spouse  Surveyor, quantity Stress:  No   Income/Employment/Disability: Employment  Financial planner: No   Educational History: Education: Risk manager: Did not discuss  Any cultural differences that may affect / interfere with treatment:  not applicable   Recreation/Hobbies: Repurchasing/painting old furniture  Stressors: Daughter's choices  Strengths: Supportive Relationships  Barriers:     Legal History: Pending legal issue / charges: The patient has no significant history of legal issues. History of legal issue / charges:  n/a  Medical History/Surgical History: reviewed Past Medical History:  Diagnosis Date   Allergy    History of basal  cell carcinoma 01/09/2016   Hypertension    Infertility, female    Post-operative nausea and vomiting    Prolactinoma (HCC)  Vitamin D deficiency 01/09/2016    Past Surgical History:  Procedure Laterality Date   BREAST BIOPSY Right 12/29/2020   COLONOSCOPY  2013, 2017   [place]   INSERTION OF MESH N/A 07/21/2022   Procedure: INSERTION OF MESH;  Surgeon: Griselda Miner, MD;  Location: [facility];  Service: General;  Laterality: N/A;   ivf     LAPAROSCOPY     for endometriosis   PLANTAR FASCIA RELEASE     PLANTAR FASCIA SURGERY     UMBILICAL HERNIA REPAIR N/A 07/21/2022   Procedure: HERNIA REPAIR UMBILICAL ADULT WITH POSSIBLE MESH;  Surgeon: Griselda Miner, MD;  Location: [facility];  Service: General;  Laterality: N/A;    Medications: Current Outpatient Medications  Medication Sig Dispense Refill   ANUCORT-HC 25 MG suppository INSERT 1 SUPPOSITORY RECTALLY TWICE DAILY AS NEEDED FOR HEMORRHOIDS 24 suppository 0   Betamethasone Diprop-Minoxidil 0.05-5 % SOLN Apply topically.     cholecalciferol (VITAMIN D) 1000 UNITS tablet Take 2,000 Units by mouth daily.     estradiol (ESTRACE) 0.1 MG/GM vaginal cream Per vagina three times per week (Patient taking differently: 2 (two) times a week. Per vagina three times per week) 42.5 g 12   finasteride (PROSCAR) 5 MG tablet Take 2.5 mg by mouth daily.     LINZESS 290 MCG CAPS capsule TAKE 1 CAPSULE BY MOUTH ONCE DAILY BEFORE  BREAKFAST 90 capsule 0   losartan (COZAAR) 25 MG tablet Take 1 tablet by mouth once daily 90 tablet 1   MAGNESIUM PO Take 200 mg by mouth daily at 6 (six) AM.     medium chain triglycerides (MCT OIL) oil Take by mouth 3 (three) times daily.     meloxicam (MOBIC) 15 MG tablet Take 1 tablet (15 mg total) by mouth daily. 30 tablet 0   vitamin C (ASCORBIC ACID) 500 MG tablet Take 500 mg by mouth daily.     Current Facility-Administered Medications  Medication Dose Route Frequency Provider  Last Rate Last Admin   dexamethasone (DECADRON) injection 4 mg  4 mg Intra-articular Once        triamcinolone acetonide (KENALOG) 10 MG/ML injection 2.5 mg  2.5 mg Intra-articular Once         Allergies  Allergen Reactions   Lactose Intolerance (Gi)     Diagnoses:  Generalized anxiety disorder  Plan of Care: I will meet with the patient every 2 to 3 weeks in person as much as possible.   French Ana, [facility]

## 2023-02-11 NOTE — Progress Notes (Signed)
                Stacy Preston M Shanzay Hepworth, LCMHC 

## 2023-02-14 NOTE — Telephone Encounter (Signed)
VOB initiated for Zilretta for RIGHT knee OA.  Next due 03/04/23

## 2023-02-17 NOTE — Telephone Encounter (Signed)
Will need to resubmit VOB for 2025 for visco and Zilretta on or after 02/23/23.

## 2023-02-24 NOTE — Telephone Encounter (Signed)
 VOB initiated for 2025.

## 2023-02-28 NOTE — Telephone Encounter (Signed)
 Medical Buy and Annette Stable - Prior Authorization REQUIRED  PBM Cat[employer]aran - NOT covered

## 2023-03-01 ENCOUNTER — Other Ambulatory Visit: Payer: Self-pay | Admitting: Gastroenterology

## 2023-03-03 NOTE — Telephone Encounter (Signed)
 Prior Authorization initiated for Zilretta via Prompt PA Prior Auth (EOC) ID:  [id number]

## 2023-03-04 ENCOUNTER — Ambulatory Visit: Payer: 59 | Admitting: Behavioral Health

## 2023-03-04 ENCOUNTER — Encounter: Payer: Self-pay | Admitting: Behavioral Health

## 2023-03-04 DIAGNOSIS — F411 Generalized anxiety disorder: Secondary | ICD-10-CM | POA: Diagnosis not present

## 2023-03-04 NOTE — Progress Notes (Signed)
 [name] Progress Note  Patient ID: Stacy Preston, MRN: [id number],    Date: 03/04/2023  Time Spent: 60 minutes, 2 PM until 3 PM This session was held via video teletherapy. The patient consented to the video teletherapy and was located in her office during this session. She is aware it is the responsibility of the patient to secure confidentiality on her end of the session. The provider was in a private home office for the duration of this session.      Treatment Type: Individual Therapy  Reported Symptoms: Stress/anxiety  Mental Status Exam: Appearance:  Well Groomed     Behavior: Appropriate  Motor: Normal  Speech/Language:  Clear and Coherent  Affect: Appropriate  Mood: normal  Thought process: normal  Thought content:   WNL  Sensory/Perceptual disturbances:   WNL  Orientation: oriented to person, place, time/date, situation, day of week, month of year, and year  Attention: Good  Concentration: Good  Memory: WNL  Fund of knowledge:  Good  Insight:   Good  Judgment:  Good  Impulse Control: Good   Risk Assessment: Danger to Self:  No Self-injurious Behavior: No Danger to Others: No Duty to Warn:no Physical Aggression / Violence:No  Access to Firearms a concern: No  Gang Involvement:No   Subjective: For the most part the patient had a fairly pleasant holiday season.  It was somewhat complicated by the situation with her daughter.  Her adult daughter is living in another state and is pregnant.  She did not have a job but recently was told that she did get a job and will start soon but the patient says that does not provide enough income for her to care for herself and for the baby moving forward.  The patient's daughter is due in June of this year.  The patient and her husband have been helping the daughter financially but they cannot financially continue to do that and have been very clear with her daughter about that.  She is  wrestling with where to set boundaries with her daughter.  Her husband had a lengthy conversation with her daughter recently about planning.  She said the daughter does not seem to think through things very thoroughly and the daughter has had a mental health diagnosis for at least 10 years.  She is not on medication currently.  The patient says it is difficult because the daughter is there and they can only help so much she had she is not sure of the daughter moving back here would be good for any of the parties.  We looked at length of setting financial mental emotional boundaries with the patient and they are still trying to figure out what they are comfortable with.  They do not want her not to have a place to live.  The daughter came to them to the foster care system and she does not want to see her daughter's child have to be a part of that.  They have advocated for adoption but the daughter does not want to do that.  Daughter does not have many supports in that area and that concerns the patient.  We also talked about those things which bring the patient comfort which she continues to do but does not have significant interest in doing them but understands the need for them.  I did introduce a couple of coping skills including relaxation breathing, grounding exercise and a vagus nerve stimulation coping skill. Patient does contract for safety having no  thoughts of hurting herself or anyone else. Interventions: Cognitive Behavioral Therapy  Diagnosis: Generalized anxiety disorder  Plan: I will meet with the patient every 2 weeks and she prefers that to be in person if possible.  Treatment plan: We will use cognitive behavioral therapy as well as elements of dialectical behavior therapy to reduce the patient's stress and anxiety level by at least 50% with a target date of August 22, 2023.  Goals will be to improve her ability to better manage anxiety symptoms and stress.  We will try to identify causes for  anxiety and explore ways to reduce that as well as trying to resolve core conflicts contributing to the cause of anxiety.  We will use cognitive behavioral therapy to help her manage thoughts and worrisome thinking contributing to anxiety.  Interventions include providing education about anxiety to help her understand its causes symptoms and triggers, facilitate problem solution skills as well as teach coping skills to help her manage anxiety.  We will use cognitive behavioral therapy to identify and change anxiety provoking thoughts as well as dialectical behavior therapy to help her with her distress tolerance and mindfulness skills.  Stacy Preston, [facility]

## 2023-03-04 NOTE — Progress Notes (Signed)
   French Ana, [facility]

## 2023-03-09 NOTE — Telephone Encounter (Signed)
 Prior authorization for Zilretta  APPROVED PA# [id number] Valid: 03/03/23-04/03/23

## 2023-03-14 NOTE — Telephone Encounter (Signed)
ZILRETTA for RIGHT knee OA   Medical Buy and Bill  Primary Insurance: [employer] Open Access Co-pay: $50 Co-insurance: 0% Deductible: does not apply Prior Auth: APPROVED PA# [id number] Valid: 03/03/23-04/03/23   Knee Injection History 03/07/20 - Kenalog LEFT 08/04/20 - Kenalog RIGHT 01/21/21 - Kenalog RIGHT 05/18/21 - Kenalog RIGHT 07/30/22 - Kenalog RIGHT 11/11/22 - Kenalog RIGHT 12/09/22 - Zilretta RIGHT

## 2023-03-14 NOTE — Telephone Encounter (Signed)
 Holding until needed

## 2023-03-22 ENCOUNTER — Ambulatory Visit: Payer: 59 | Admitting: Behavioral Health

## 2023-03-22 ENCOUNTER — Encounter: Payer: Self-pay | Admitting: Behavioral Health

## 2023-03-22 DIAGNOSIS — F411 Generalized anxiety disorder: Secondary | ICD-10-CM | POA: Diagnosis not present

## 2023-03-22 NOTE — Progress Notes (Signed)
[place] [facility] Counselor/Therapist Progress Note  Patient ID: HADEEL HILLEBRAND, MRN: [id number],    Date: 03/22/2023  Time Spent: 8 AM until 8:58 AM, 58 minutes spent face-to-face with the patient in the outpatient therapist office.   Treatment Type: Individual Therapy  Reported Symptoms: Stress/anxiety  Mental Status Exam: Appearance:  Well Groomed     Behavior: Appropriate  Motor: Normal  Speech/Language:  Clear and Coherent  Affect: Appropriate  Mood: normal  Thought process: normal  Thought content:   WNL  Sensory/Perceptual disturbances:   WNL  Orientation: oriented to person, place, time/date, situation, day of week, month of year, and year  Attention: Good  Concentration: Good  Memory: WNL  Fund of knowledge:  Good  Insight:   Good  Judgment:  Good  Impulse Control: Good   Risk Assessment: Danger to Self:  No Self-injurious Behavior: No Danger to Others: No Duty to Warn:no Physical Aggression / Violence:No  Access to Firearms a concern: No  Gang Involvement:No   Subjective: The patient's daughter found out since our last session that she is having her daughter so the patient will have a granddaughter.  Approximately due date is late in May of this year.  The biggest struggle for the patient is not knowing what to do to help her daughter.  They are helping financially but cannot continue to do that long-term.  They have offered to let her come back home to have the baby so she can have additional support.  The daughter has nowhere to live and limited supports in the area that she is living in.  We looked at different ways that she could approach that conversation with her daughter as she has been open with her options and alternatives.  The patient is having a difficult time accepting the fact that she cannot make it happen what she would like to see and knows will be best for the daughter to happen.  We started looking at acceptance and what that meant for  the patient in the situation.  For homework I ask her to make a list of things that she was concerned about and opposite of that the perfect scenario but in addition to that logical acceptance landing spots might be possibilities.   Patient does contract for safety having no thoughts of hurting herself or anyone else. Interventions: Cognitive Behavioral Therapy  Diagnosis: Generalized anxiety disorder  Plan: I will meet with the patient every 2 weeks and she prefers that to be in person if possible.  Treatment plan: We will use cognitive behavioral therapy as well as elements of dialectical behavior therapy to reduce the patient's stress and anxiety level by at least 50% with a target date of August 22, 2023.  Goals will be to improve her ability to better manage anxiety symptoms and stress.  We will try to identify causes for anxiety and explore ways to reduce that as well as trying to resolve core conflicts contributing to the cause of anxiety.  We will use cognitive behavioral therapy to help her manage thoughts and worrisome thinking contributing to anxiety.  Interventions include providing education about anxiety to help her understand its causes symptoms and triggers, facilitate problem solution skills as well as teach coping skills to help her manage anxiety.  We will use cognitive behavioral therapy to identify and change anxiety provoking thoughts as well as dialectical behavior therapy to help her with her distress tolerance and mindfulness skills.  French Ana, [facility]  French Ana, [facility]

## 2023-03-24 ENCOUNTER — Encounter: Payer: Self-pay | Admitting: Podiatry

## 2023-03-24 ENCOUNTER — Ambulatory Visit: Payer: Managed Care, Other (non HMO) | Admitting: Podiatry

## 2023-03-24 DIAGNOSIS — M722 Plantar fascial fibromatosis: Secondary | ICD-10-CM

## 2023-03-24 MED ORDER — DEXAMETHASONE SODIUM PHOSPHATE 120 MG/30ML IJ SOLN
4.0000 mg | Freq: Once | INTRAMUSCULAR | Status: AC
  Administered 2023-03-24: 4 mg via INTRA_ARTICULAR

## 2023-03-24 MED ORDER — TRIAMCINOLONE ACETONIDE 10 MG/ML IJ SUSP
2.5000 mg | Freq: Once | INTRAMUSCULAR | Status: AC
  Administered 2023-03-24: 2.5 mg via INTRA_ARTICULAR

## 2023-03-24 NOTE — Progress Notes (Signed)
  Subjective:  Patient ID: Stacy Preston, female    DOB: Feb 21, 1961,   MRN: [id number]  No chief complaint on file.   63 y.o. female presents for follow-up of left plantar fasciitis. Relates doing the same and the injection did not help. Relates the meloxicam has not been helpful either. Does relates the brace has gelt better. . She has had plantar fasciitis in the past and had topaz and PRP procedure. . Denies any other pedal complaints. Denies n/v/f/c.   Past Medical History:  Diagnosis Date   Allergy    History of basal cell carcinoma 01/09/2016   Hypertension    Infertility, female    Post-operative nausea and vomiting    Prolactinoma (HCC)    Vitamin D deficiency 01/09/2016    Objective:  Physical Exam: Vascular: DP/PT pulses 2/4 bilateral. CFT <3 seconds. Normal hair growth on digits. No edema.  Skin. No lacerations or abrasions bilateral feet.  Musculoskeletal: MMT 5/5 bilateral lower extremities in DF, PF, Inversion and Eversion. Deceased ROM in DF of ankle joint. Tender to the medial calcaneal tubercle bilaterally . No pain with achilles, PT or arch. No pain with calcaneal squeeze. More tender on left  Neurological: Sensation intact to light touch.   Assessment:   1. Plantar fasciitis, bilateral       Plan:  Patient was evaluated and treated and all questions answered. Discussed plantar fasciitis with patient.  X-rays reviewed and discussed with patient. No acute fractures or dislocations noted. Mild spurring noted at inferior calcaneus.  Discussed treatment options including, ice, NSAIDS, supportive shoes, bracing, and stretching. Continue stretching and anti-inflammatories.  Patient requesting injection today. Procedure note below.   Continue brace.  Follow-up if pain continues advised to message me if she would like to get MRI/PT,   Procedure:  Discussed etiology, pathology, conservative vs. surgical therapies. At this time a plantar fascial injection was  recommended.  The patient agreed and a sterile skin prep was applied.  An injection consisting of  1cc dexamethasone 0.5 cc kenalog and 1cc marcaine mixture was infiltrated at the point of maximal tenderness on the left Heel.  Bandaid applied. The patient tolerated this well and was given instructions for aftercare.    Louann Sjogren, DPM

## 2023-03-25 ENCOUNTER — Telehealth: Payer: Self-pay

## 2023-03-25 NOTE — Telephone Encounter (Signed)
Patient called asking about the name of the procedure for knees where they go through the groin? Patient would like a call back

## 2023-03-28 ENCOUNTER — Encounter: Payer: Self-pay | Admitting: Podiatry

## 2023-03-28 ENCOUNTER — Other Ambulatory Visit: Payer: Self-pay | Admitting: Podiatry

## 2023-03-28 DIAGNOSIS — M722 Plantar fascial fibromatosis: Secondary | ICD-10-CM

## 2023-03-31 NOTE — Progress Notes (Signed)
 LILLETTE Ileana Collet, PhD, LAT, ATC acting as a scribe for Artist Lloyd, MD.  Stacy Preston is a 63 y.o. female who presents to [facility] today for cont'd R knee pain. Pt was last seen by Dr. Lloyd on 12/09/22 and was given a Zilretta  injection and advised to cont off-loader knee brace.  Today, pt reports a wk after her last Zilretta  injection she suffered a fall. Pain has cont'd to worsen since then.   She also numbness/tingling through her R arm and lateral aspect of her R leg. She has been doing chiro treatments for this.   Dx imaging: 11/21/22 R knee MRI 07/30/22 R knee XR 08/09/20 R knee MRI             08/03/20 R knee XR             09/26/18 R knee XR  Pertinent review of systems: No fevers or chills  Relevant historical information: Lumbar radiculopathy history.   Exam:  BP 136/82   Pulse 77   Ht 5' 3 (1.6 m)   Wt 165 lb (74.8 kg)   SpO2 99%   BMI 29.23 kg/m  General: Well Developed, well nourished, and in no acute distress.   MSK: Right knee moderate effusion normal motion with crepitation.  C-spine nontender to palpation normal cervical motion pressure strength is intact.  L-spine normal motion intact strength.    Lab and Radiology Results   Zilretta  injection right knee Procedure: Real-time Ultrasound Guided Injection of right knee joint superior lateral patellar space Device: Philips Affiniti 50G Images permanently stored and available for review in PACS Verbal informed consent obtained.  Discussed risks and benefits of procedure. Warned about infection, hyperglycemia bleeding, damage to structures among others. Patient expresses understanding and agreement Time-out conducted.   Noted no overlying erythema, induration, or other signs of local infection.   Skin prepped in a sterile fashion.   Local anesthesia: Topical Ethyl chloride.   With sterile technique and under real time ultrasound guidance: Zilretta  32 mg injected  into knee joint. Fluid seen entering the joint capsule.   Completed without difficulty   Advised to call if fevers/chills, erythema, induration, drainage, or persistent bleeding.   Images permanently stored and available for review in the ultrasound unit.  Impression: Technically successful ultrasound guided injection.  Lot number: [id number]  X-ray images C-spine obtained today personally and independently interpreted. Multilevel DDD.  No acute fractures are visible.  Significant DDD at C4-C6 Await formal radiology review  MRI lumbar spine November 2023 shows potential for right L5-S1 neuroforaminal stenosis.   Assessment and Plan: 63 y.o. female with chronic right knee pain due to exacerbation of DJD.  Plan for Zilretta  injection today.  Additionally she has symptoms consistent with both right cervical radiculopathy and right lumbar radiculopathy.  L5 lumbar radiculopathy does make sense based on her MRI and current symptoms.  Consider epidural steroid injection in the future if needed.  Cervical radiculopathy is less clear.  She does not have a cervical spine MRI that we can look at.  Plan for updated x-ray and consider MRI C-spine in the future if needed.   PDMP not reviewed this encounter. Orders Placed This Encounter  Procedures   US  LIMITED JOINT SPACE STRUCTURES LOW RIGHT(NO LINKED CHARGES)    Reason for Exam (SYMPTOM  OR DIAGNOSIS REQUIRED):   right knee pain    Preferred imaging location?:   [facility] Cervical Spine  2 or 3 views    Standing Status:   Future    Number of Occurrences:   1    Expiration Date:   03/31/2024    Reason for Exam (SYMPTOM  OR DIAGNOSIS REQUIRED):   cervical radiculopathy    Preferred imaging location?:   [facility] ordered this encounter  Medications   Triamcinolone  Acetonide (ZILRETTA ) intra-articular injection 32 mg     Discussed warning signs or symptoms. Please see discharge instructions.  Patient expresses understanding.   The above documentation has been reviewed and is accurate and complete Artist Lloyd, M.D.

## 2023-04-01 ENCOUNTER — Ambulatory Visit: Payer: Managed Care, Other (non HMO) | Admitting: Family Medicine

## 2023-04-01 ENCOUNTER — Ambulatory Visit (INDEPENDENT_AMBULATORY_CARE_PROVIDER_SITE_OTHER): Payer: Managed Care, Other (non HMO)

## 2023-04-01 ENCOUNTER — Other Ambulatory Visit: Payer: Self-pay

## 2023-04-01 VITALS — BP 136/82 | HR 77 | Ht 63.0 in | Wt 165.0 lb

## 2023-04-01 DIAGNOSIS — M1711 Unilateral primary osteoarthritis, right knee: Secondary | ICD-10-CM | POA: Diagnosis not present

## 2023-04-01 DIAGNOSIS — G8929 Other chronic pain: Secondary | ICD-10-CM

## 2023-04-01 DIAGNOSIS — M5412 Radiculopathy, cervical region: Secondary | ICD-10-CM | POA: Diagnosis not present

## 2023-04-01 DIAGNOSIS — M25561 Pain in right knee: Secondary | ICD-10-CM | POA: Diagnosis not present

## 2023-04-01 DIAGNOSIS — M5416 Radiculopathy, lumbar region: Secondary | ICD-10-CM

## 2023-04-01 MED ORDER — TRIAMCINOLONE ACETONIDE 32 MG IX SRER
32.0000 mg | Freq: Once | INTRA_ARTICULAR | Status: AC
  Administered 2023-04-01: 32 mg via INTRA_ARTICULAR

## 2023-04-01 NOTE — Patient Instructions (Addendum)
 Thank you for coming in today.   Please get an Xray today before you leave  You received an injection today. Seek immediate medical attention if the joint becomes red, extremely painful, or is oozing fluid.   I can repeat the Zilretta  injection in 3 months if needed

## 2023-04-05 ENCOUNTER — Encounter: Payer: Self-pay | Admitting: Behavioral Health

## 2023-04-05 ENCOUNTER — Ambulatory Visit: Payer: 59 | Admitting: Behavioral Health

## 2023-04-05 DIAGNOSIS — F411 Generalized anxiety disorder: Secondary | ICD-10-CM | POA: Diagnosis not present

## 2023-04-05 NOTE — Progress Notes (Signed)
[facility] Counselor/Therapist Progress Note  Patient ID: TANGIA PINARD, MRN: [id number],    Date: 04/05/2023  Time Spent: 8 AM until 8:55 AM, spent face-to-face with the patient in the outpatient therapist office.   Treatment Type: Individual Therapy  Reported Symptoms: Stress/anxiety  Mental Status Exam: Appearance:  Well Groomed     Behavior: Appropriate  Motor: Normal  Speech/Language:  Clear and Coherent  Affect: Appropriate  Mood: normal  Thought process: normal  Thought content:   WNL  Sensory/Perceptual disturbances:   WNL  Orientation: oriented to person, place, time/date, situation, day of week, month of year, and year  Attention: Good  Concentration: Good  Memory: WNL  Fund of knowledge:  Good  Insight:   Good  Judgment:  Good  Impulse Control: Good   Risk Assessment: Danger to Self:  No Self-injurious Behavior: No Danger to Others: No Duty to Warn:no Physical Aggression / Violence:No  Access to Firearms a concern: No  Gang Involvement:No   Subjective: The patient's daughter has decided to move back to this area and live with the patient and her husband.  That relieved a lot of the patient's anxiety about the daughter being on the year and not having a lot of resources.  She had not yet found a job but will start looking when she moves down here in late February.  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  We also began to talk about what it would look like for the patient and her husband once daughter moved back and in terms of activation and adjustments.  We talked about the importance of keeping normalcy and routine for the patient and her husband is much as possible.  They are hoping she can find something to do  part-time even with pregnancy to start to be able to have some income coming in but definitely after some time after having the baby.  The patient feels that things will start to get better after her daughter has had time to come home and settle for a little while because has been a traumatic event for her daughter also.  The patient says her anxiety level has gone down and encouraged use of coping skills throughout the process.    Patient does contract for safety having no thoughts of hurting herself or anyone else. Interventions: Cognitive Behavioral Therapy  Diagnosis: Generalized anxiety disorder  Plan: I will meet with the patient every 2 weeks and she prefers that to be in person if possible.  Treatment plan: We will use cognitive behavioral therapy as well as elements of dialectical behavior therapy to reduce the patient's stress and anxiety level by at least 50% with a target date of August 22, 2023.  Goals will be to improve her ability to better manage anxiety symptoms and stress.  We will try to identify causes for anxiety and explore ways to reduce that as well as trying to resolve core conflicts contributing to the cause of anxiety.  We will use cognitive behavioral therapy to help her manage thoughts and worrisome thinking contributing to anxiety.  Interventions include providing education about anxiety to help her understand its causes symptoms and triggers, facilitate problem solution skills as well as teach coping skills to  help her manage anxiety.  We will use cognitive behavioral therapy to identify and change anxiety provoking thoughts as well as dialectical behavior therapy to help her with her distress tolerance and mindfulness skills.  French Ana, [facility]                  French Ana, [facility]               French Ana, [facility]

## 2023-04-06 ENCOUNTER — Encounter: Payer: Self-pay | Admitting: Podiatry

## 2023-04-09 ENCOUNTER — Other Ambulatory Visit: Payer: Managed Care, Other (non HMO)

## 2023-04-11 ENCOUNTER — Ambulatory Visit
Admission: RE | Admit: 2023-04-11 | Discharge: 2023-04-11 | Disposition: A | Discharge status: Home / Self Care | Payer: Managed Care, Other (non HMO) | Source: Ambulatory Visit | Attending: Podiatry | Admitting: Podiatry

## 2023-04-11 DIAGNOSIS — M722 Plantar fascial fibromatosis: Secondary | ICD-10-CM

## 2023-04-14 NOTE — Telephone Encounter (Signed)
Pt received Zilretta inj for RIGHT knee OA on 04/01/23 Can consider repeat inj on or after 06/25/23

## 2023-04-15 ENCOUNTER — Encounter: Payer: Self-pay | Admitting: Family Medicine

## 2023-04-15 NOTE — Telephone Encounter (Signed)
Per visit note 04/01/23:  Assessment and Plan: 63 y.o. female with chronic right knee pain due to exacerbation of DJD.  Plan for Zilretta injection today.   Additionally she has symptoms consistent with both right cervical radiculopathy and right lumbar radiculopathy.  L5 lumbar radiculopathy does make sense based on her MRI and current symptoms.  Consider epidural steroid injection in the future if needed.   Cervical radiculopathy is less clear.  She does not have a cervical spine MRI that we can look at.  Plan for updated x-ray and consider MRI C-spine in the future if needed.   Forwarding to Dr. Denyse Amass to review and advise.

## 2023-04-15 NOTE — Progress Notes (Signed)
Cervical spine x-ray shows medium arthritis

## 2023-04-16 ENCOUNTER — Encounter: Payer: Self-pay | Admitting: Family Medicine

## 2023-04-16 DIAGNOSIS — Z1159 Encounter for screening for other viral diseases: Secondary | ICD-10-CM

## 2023-04-16 DIAGNOSIS — Z0184 Encounter for antibody response examination: Secondary | ICD-10-CM

## 2023-04-19 ENCOUNTER — Ambulatory Visit: Payer: 59 | Admitting: Behavioral Health

## 2023-04-19 ENCOUNTER — Encounter: Payer: Self-pay | Admitting: Behavioral Health

## 2023-04-19 DIAGNOSIS — F411 Generalized anxiety disorder: Secondary | ICD-10-CM

## 2023-04-19 NOTE — Progress Notes (Signed)
 [facility] Counselor/Therapist Progress Note  Patient ID: Stacy Preston, MRN: [id number],    Date: 04/19/2023  Time Spent: 3 PM until 3:55 PM, spent face-to-face with the patient in the outpatient therapist office.   Treatment Type: Individual Therapy  Reported Symptoms: Stress/anxiety  Mental Status Exam: Appearance:  Well Groomed     Behavior: Appropriate  Motor: Normal  Speech/Language:  Clear and Coherent  Affect: Appropriate  Mood: normal  Thought process: normal  Thought content:   WNL  Sensory/Perceptual disturbances:   WNL  Orientation: oriented to person, place, time/date, situation, day of week, month of year, and year  Attention: Good  Concentration: Good  Memory: WNL  Fund of knowledge:  Good  Insight:   Good  Judgment:  Good  Impulse Control: Good   Risk Assessment: Danger to Self:  No Self-injurious Behavior: No Danger to Others: No Duty to Warn:no Physical Aggression / Violence:No  Access to Firearms a concern: No  Gang Involvement:No   Subjective: The patient and her husband are leaving in a couple of days to go pick their daughter up to bring her home.  She says that her daughter has seemed positive for the last few conversations and that she thinks her daughter is checking off all the needs to be done in order to make the transition down here.  The patient and her husband acknowledge that there will be multiple things to adjust to some of which they cannot necessarily plan for different scenarios and how might best be handled by the patient and her husband.  The patient has done a lot of things in terms of setting up services medical daughters pregnancy care.  She would like to look into therapy for her daughter as well as possible potential part-time work.  She did look into [employer] which both she and her daughter are excited about looking into.  She also says that she notices some things that her husband is anxious about  with her daughter coming home.  The relationship has not been as good in the past and although she sees him starting to get excited she wants to prepare him.  I encouraged them to go stay at their routines and communicate intensely but we also talked about how he could challenge his thoughts about what the relationship was look like in the past and how to reframe that moving forward.  Also encouraged him to look into the 5 love languages and how that could be applied to his relationship with his daughter.  Patient does contract for safety having no thoughts of hurting herself or anyone else. Interventions: Cognitive Behavioral Therapy  Diagnosis: Generalized anxiety disorder  Plan: I will meet with the patient every 2 weeks and she prefers that to be in person if possible.  Treatment plan: We will use cognitive behavioral therapy as well as elements of dialectical behavior therapy to reduce the patient's stress and anxiety level by at least 50% with a target date of August 22, 2023.  Goals will be to improve her ability to better manage anxiety symptoms and stress.  We will try to identify causes for anxiety and explore ways to reduce that as well as trying to resolve core conflicts contributing to the cause of anxiety.  We will use cognitive behavioral therapy to help her manage thoughts and worrisome thinking contributing to anxiety.  Interventions include providing education about anxiety to help her understand its causes symptoms and triggers, facilitate problem solution skills as well  as teach coping skills to help her manage anxiety.  We will use cognitive behavioral therapy to identify and change anxiety provoking thoughts as well as dialectical behavior therapy to help her with her distress tolerance and mindfulness skills.  French Ana, [facility]                  French Ana, [facility]               French Ana, [facility]               French Ana,  [facility]

## 2023-04-28 ENCOUNTER — Encounter: Payer: Self-pay | Admitting: Family Medicine

## 2023-04-28 LAB — MEASLES/MUMPS/RUBELLA IMMUNITY
MUMPS ABS, IGG: 17.5 [AU]/ml (ref >10.9)
RUBEOLA AB, IGG: >300 [AU]/ml (ref >16.4)
Rubella Antibodies, IGG: 13 {index} (ref >0.99)

## 2023-04-29 ENCOUNTER — Ambulatory Visit: Admitting: Podiatry

## 2023-04-29 ENCOUNTER — Ambulatory Visit: Payer: Managed Care, Other (non HMO) | Admitting: Family Medicine

## 2023-04-29 ENCOUNTER — Encounter: Payer: Self-pay | Admitting: Podiatry

## 2023-04-29 ENCOUNTER — Telehealth: Payer: Self-pay | Admitting: Podiatry

## 2023-04-29 DIAGNOSIS — M722 Plantar fascial fibromatosis: Secondary | ICD-10-CM

## 2023-04-29 MED ORDER — METHYLPREDNISOLONE 4 MG PO TBPK: ORAL_TABLET | ORAL | 0 refills | Status: DC

## 2023-04-29 NOTE — Progress Notes (Signed)
  Subjective:  Patient ID: Stacy Preston, female    DOB: 1960/07/24,   MRN: [id number]  No chief complaint on file.   63 y.o. female presents for follow-up of left plantar fasciitis. Relates doing the same and the injection did not help. Nothing has helped still and has had MRI and here to review results and discucss options. . . She has had plantar fasciitis in the past and had topaz and PRP procedure. . Denies any other pedal complaints. Denies n/v/f/c.   Past Medical History:  Diagnosis Date   Allergy    History of basal cell carcinoma 01/09/2016   Hypertension    Infertility, female    Post-operative nausea and vomiting    Prolactinoma (HCC)    Vitamin D deficiency 01/09/2016    Objective:  Physical Exam: Vascular: DP/PT pulses 2/4 bilateral. CFT <3 seconds. Normal hair growth on digits. No edema.  Skin. No lacerations or abrasions bilateral feet.  Musculoskeletal: MMT 5/5 bilateral lower extremities in DF, PF, Inversion and Eversion. Deceased ROM in DF of ankle joint. Tender to the medial calcaneal tubercle bilaterally . No pain with achilles, PT or arch. No pain with calcaneal squeeze. More tender on left  Neurological: Sensation intact to light touch.    IMPRESSION: 1. Mild plantar fasciitis of the medial band of the plantar fascia towards the calcaneal insertion with subcortical marrow edema. Small plantar calcaneal enthesophyte. 2. Mild peroneus brevis tendinosis with a short-segment longitudinal split tear just distal to the lateral malleolus.   Assessment:   1. Plantar fasciitis, bilateral        Plan:  Patient was evaluated and treated and all questions answered. Discussed plantar fasciitis with patient.  X-rays reviewed and discussed with patient. No acute fractures or dislocations noted. Mild spurring noted at inferior calcaneus.  Discussed treatment options including, ice, NSAIDS, supportive shoes, bracing, and stretching. Continue stretching and  anti-inflammatories.  Continue brace. MRI reviewed. Confirm reoccurrence of plantar fasciitis. Discussed treatment optiosn with patient including PRP and plantar fasciotomy. Discussed perioperative course. Patient would like to consider surgical intervention with plantar fascia release. . -Informed surgical risk consent was reviewed and read aloud to the patient.  I reviewed the films.  I have discussed my findings with the patient in great detail.  I have discussed all risks including but not limited to infection, stiffness, scarring, limp, disability, deformity, damage to blood vessels and nerves, numbness, poor healing, need for braces, arthritis, chronic pain, amputation, death.  All benefits and realistic expectations discussed in great detail.  I have made no promises as to the outcome.  I have provided realistic expectations.  I have offered the patient a 2nd opinion, which they have declined and assured me they preferred to proceed despite the risks. Plan for surgery in April.  Post-op meds: Oxycodon 5/325, zofran    Louann Sjogren, DPM

## 2023-04-29 NOTE — Telephone Encounter (Signed)
 Pt called and was seen today and is wanting to get the surgery scheduled for 05/31/23 please.

## 2023-05-02 ENCOUNTER — Encounter: Payer: Self-pay | Admitting: Behavioral Health

## 2023-05-02 ENCOUNTER — Ambulatory Visit: Payer: 59 | Admitting: Behavioral Health

## 2023-05-02 DIAGNOSIS — F411 Generalized anxiety disorder: Secondary | ICD-10-CM | POA: Diagnosis not present

## 2023-05-02 NOTE — Progress Notes (Unsigned)
 [facility] Counselor/Therapist Progress Note  Patient ID: HALYNN REITANO, MRN: [id number],    Date: 05/02/2023  Time Spent: 3 PM until 3:54 PM, 54 minutes spent face-to-face with the patient in the outpatient therapist office.   Treatment Type: Individual Therapy  Reported Symptoms: Stress/anxiety  Mental Status Exam: Appearance:  Well Groomed     Behavior: Appropriate  Motor: Normal  Speech/Language:  Clear and Coherent  Affect: Appropriate  Mood: normal  Thought process: normal  Thought content:   WNL  Sensory/Perceptual disturbances:   WNL  Orientation: oriented to person, place, time/date, situation, day of week, month of year, and year  Attention: Good  Concentration: Good  Memory: WNL  Fund of knowledge:  Good  Insight:   Good  Judgment:  Good  Impulse Control: Good   Risk Assessment: Danger to Self:  No Self-injurious Behavior: No Danger to Others: No Duty to Warn:no Physical Aggression / Violence:No  Access to Firearms a concern: No  Gang Involvement:No  The patient's daughter is now with the patient and her husband.  She has been there about a week and so far it appears to be going fairly well.  She understands it will be an adjustment for everyone.  The daughter has stepped in doing things around the house to help.  She had an interview for a part-time job this weekend.  The patient and her daughter spent time going to look for a baby which is due in early June of this year.  It seems to be more of an effort on her husband's part to connect with the daughter and we talked about ways that they could help connection as they readjust to living together.  The patient says her stress level has gone down some especially having her daughter home saying that she is healthy. Subjective; Patient does contract for safety having no thoughts of hurting herself or anyone else. Interventions: Cognitive Behavioral Therapy  Diagnosis: Generalized  anxiety disorder  Plan: I .  Will meet with the patient every 2 weeks and she prefers that to be in person if possible.  Treatment plan: We will use cognitive behavioral therapy as well as elements of dialectical behavior therapy to reduce the patient's stress and anxiety level by at least 50% with a target date of August 22, 2023.  Goals will be to improve her ability to better manage anxiety symptoms and stress.  We will try to identify causes for anxiety and explore ways to reduce that as well as trying to resolve core conflicts contributing to the cause of anxiety.  We will use cognitive behavioral therapy to help her manage thoughts and worrisome thinking contributing to anxiety.  Interventions include providing education about anxiety to help her understand its causes symptoms and triggers, facilitate problem solution skills as well as teach coping skills to help her manage anxiety.  We will use cognitive behavioral therapy to identify and change anxiety provoking thoughts as well as dialectical behavior therapy to help her with her distress tolerance and mindfulness skills.  French Ana, [facility]                  French Ana, [facility]               French Ana, [facility]               French Ana, [facility]        Albertha Ghee  Noe Gens, [facility]

## 2023-05-04 ENCOUNTER — Encounter: Payer: Self-pay | Admitting: Podiatry

## 2023-05-04 ENCOUNTER — Telehealth: Payer: Self-pay | Admitting: Urology

## 2023-05-04 NOTE — Telephone Encounter (Signed)
 DOS 05/31/23  PLANTAR FASCIOTOMY LEFT --- 28060  [employer]   PER [employer]'S AUTOMATED SYSTEM FOR CPT CODE 96045 NO PRIOR AUTH IS REQUIRED.   CALL REF # [id number]

## 2023-05-06 ENCOUNTER — Other Ambulatory Visit: Payer: Self-pay | Admitting: Podiatry

## 2023-05-06 MED ORDER — METHYLPREDNISOLONE 4 MG PO TBPK
ORAL_TABLET | ORAL | 0 refills | Status: DC
Start: 2023-05-06 — End: 2023-07-06

## 2023-05-18 ENCOUNTER — Telehealth: Payer: Self-pay

## 2023-05-18 NOTE — Telephone Encounter (Signed)
 Copied from CRM [id number]. Topic: Clinical - Medical Advice >> May 18, 2023  8:59 AM Marland Kitchen D wrote: Patient wants to know when she got her last Tdap I let her know it was 01-09-2016 she wants to be called back regarding how often she needs to get the shot and wants to make sure she is up to date on things.

## 2023-05-18 NOTE — Telephone Encounter (Signed)
 Spoke with patient states she had an injury on Monday where needle stuck into her finger.  She washed area with alcohol and peroxide. Area looks normal with small scab at site - no issues but she was wondering if she needed a booster of the Tdap vaccine or not ?

## 2023-05-19 NOTE — Telephone Encounter (Signed)
 Attempted call to patient. Left a voice mail message requesting a return call.

## 2023-05-20 NOTE — Telephone Encounter (Signed)
 Patient informed.

## 2023-05-23 ENCOUNTER — Ambulatory Visit: Payer: 59 | Admitting: Behavioral Health

## 2023-05-24 HISTORY — PX: PLANTAR FASCIA RELEASE: SHX2239

## 2023-05-30 ENCOUNTER — Ambulatory Visit: Admitting: Behavioral Health

## 2023-05-31 ENCOUNTER — Other Ambulatory Visit: Payer: Self-pay | Admitting: Podiatry

## 2023-05-31 DIAGNOSIS — M722 Plantar fascial fibromatosis: Secondary | ICD-10-CM | POA: Diagnosis not present

## 2023-05-31 MED ORDER — ONDANSETRON HCL 4 MG PO TABS: 4.0000 mg | ORAL_TABLET | Freq: Three times a day (TID) | ORAL | 0 refills | Status: DC | PRN

## 2023-05-31 MED ORDER — OXYCODONE-ACETAMINOPHEN 5-325 MG PO TABS: 1.0000 | ORAL_TABLET | ORAL | 0 refills | Status: AC | PRN

## 2023-06-01 ENCOUNTER — Telehealth: Payer: Self-pay | Admitting: Podiatry

## 2023-06-01 NOTE — Telephone Encounter (Signed)
 Patient had surgery on 05/31/23.  Is asking if she can take the boot off and put plastic over surgical area or need to keep the boot on? Had a fasciotomy.

## 2023-06-03 ENCOUNTER — Other Ambulatory Visit: Payer: Self-pay

## 2023-06-03 MED ORDER — LINACLOTIDE 290 MCG PO CAPS: 290.0000 ug | ORAL_CAPSULE | Freq: Every day | ORAL | 0 refills | Status: DC

## 2023-06-07 ENCOUNTER — Encounter: Payer: Self-pay | Admitting: Behavioral Health

## 2023-06-07 ENCOUNTER — Ambulatory Visit: Payer: 59 | Admitting: Behavioral Health

## 2023-06-07 DIAGNOSIS — F411 Generalized anxiety disorder: Secondary | ICD-10-CM

## 2023-06-07 DIAGNOSIS — F4322 Adjustment disorder with anxiety: Secondary | ICD-10-CM

## 2023-06-07 NOTE — Telephone Encounter (Signed)
 VOB initiated for Zilretta for RIGHT knee OA.

## 2023-06-07 NOTE — Progress Notes (Signed)
  [facility] Counselor/Therapist Progress Note  Patient ID: SIRINITY OUTLAND, MRN: [id number],    Date: 06/07/2023  Time Spent: 11 AM until 12 PM, 60 minutes spent face-to-face with the patient in the outpatient therapist office.   Treatment Type: Individual Therapy  Reported Symptoms: Stress/anxiety  Mental Status Exam: Appearance:  Well Groomed     Behavior: Appropriate  Motor: Normal  Speech/Language:  Clear and Coherent  Affect: Appropriate  Mood: normal  Thought process: normal  Thought content:   WNL  Sensory/Perceptual disturbances:   WNL  Orientation: oriented to person, place, time/date, situation, day of week, month of year, and year  Attention: Good  Concentration: Good  Memory: WNL  Fund of knowledge:  Good  Insight:   Good  Judgment:  Good  Impulse Control: Good   Risk Assessment: Danger to Self:  No Self-injurious Behavior: No Danger to Others: No Duty to Warn:no Physical Aggression / Violence:No  Access to Firearms a concern: No  Gang Involvement:No  Subjective; for the most part things are going well.  Her daughter is to at the end of May.  For the most part she feels well.  Her daughter is working part-time.  The baby showers at about a week and a half patient and her daughter and her husband are all doing things to plan for that and that is going well.  She still would like to see her husband and daughter spend a little more time together and her daughter reach out to some others in the community to start forming some networks but she knows she can only encourage it so much.  The patient says she still has some anxiety because of the doctor but she is not having any more panic and that she is thankful for that.  I encouraged her to continue to practice daily the relaxation breathing at some of the anxiety reduction techniques processed.  She understands will be adjustments she says she is learning to let go of having to have everything planned  and ordered and is learning to be more flexible. Patient does contract for safety having no thoughts of hurting herself or anyone else. Interventions: Cognitive Behavioral Therapy  Diagnosis: Generalized anxiety disorder  Plan: I .  Will meet with the patient every 2 weeks and she prefers that to be in person if possible.  Treatment plan: We will use cognitive behavioral therapy as well as elements of dialectical behavior therapy to reduce the patient's stress and anxiety level by at least 50% with a target date of August 22, 2023.  Goals will be to improve her ability to better manage anxiety symptoms and stress.  We will try to identify causes for anxiety and explore ways to reduce that as well as trying to resolve core conflicts contributing to the cause of anxiety.  We will use cognitive behavioral therapy to help her manage thoughts and worrisome thinking contributing to anxiety.  Interventions include providing education about anxiety to help her understand its causes symptoms and triggers, facilitate problem solution skills as well as teach coping skills to help her manage anxiety.  We will use cognitive behavioral therapy to identify and change anxiety provoking thoughts as well as dialectical behavior therapy to help her with her distress tolerance and mindfulness skills.  French Ana, [facility]

## 2023-06-09 ENCOUNTER — Other Ambulatory Visit: Payer: Self-pay

## 2023-06-09 ENCOUNTER — Ambulatory Visit (INDEPENDENT_AMBULATORY_CARE_PROVIDER_SITE_OTHER): Admitting: Podiatry

## 2023-06-09 ENCOUNTER — Encounter: Payer: Self-pay | Admitting: Podiatry

## 2023-06-09 ENCOUNTER — Ambulatory Visit (INDEPENDENT_AMBULATORY_CARE_PROVIDER_SITE_OTHER)

## 2023-06-09 DIAGNOSIS — Z9889 Other specified postprocedural states: Secondary | ICD-10-CM

## 2023-06-09 DIAGNOSIS — M722 Plantar fascial fibromatosis: Secondary | ICD-10-CM | POA: Diagnosis not present

## 2023-06-09 NOTE — Progress Notes (Signed)
 Subjective:  Patient ID: Stacy Preston, female    DOB: [birth year],  MRN: [id number]  No chief complaint on file.   DOS: 05/31/23 Procedure: Left plantar fasciotomy  63 y.o. female returns for POV#1. Relates doing well and managing with pain  Review of Systems: Negative except as noted in the HPI. Denies N/V/F/Ch.  Past Medical History:  Diagnosis Date   Allergy    History of basal cell carcinoma 01/09/2016   Hypertension    Infertility, female    Post-operative nausea and vomiting    Prolactinoma (HCC)    Vitamin D deficiency 01/09/2016    Current Outpatient Medications:    ANUCORT-HC 25 MG suppository, INSERT 1 SUPPOSITORY RECTALLY TWICE DAILY AS NEEDED FOR HEMORRHOIDS, Disp: 24 suppository, Rfl: 0   Betamethasone Diprop-Minoxidil 0.05-5 % SOLN, Apply topically., Disp: , Rfl:    cholecalciferol (VITAMIN D) 1000 UNITS tablet, Take 2,000 Units by mouth daily., Disp: , Rfl:    estradiol (ESTRACE) 0.1 MG/GM vaginal cream, Per vagina three times per week (Patient taking differently: 2 (two) times a week. Per vagina three times per week), Disp: 42.5 g, Rfl: 12   finasteride (PROSCAR) 5 MG tablet, Take 2.5 mg by mouth daily., Disp: , Rfl:    linaclotide (LINZESS) 290 MCG CAPS capsule, Take 1 capsule (290 mcg total) by mouth daily before breakfast. Patient needs follow up appointment for future refills. Please call [phone] to schedule an appointment., Disp: 30 capsule, Rfl: 0   losartan (COZAAR) 25 MG tablet, Take 1 tablet by mouth once daily, Disp: 90 tablet, Rfl: 1   MAGNESIUM PO, Take 200 mg by mouth daily at 6 (six) AM., Disp: , Rfl:    medium chain triglycerides (MCT OIL) oil, Take by mouth 3 (three) times daily., Disp: , Rfl:    meloxicam (MOBIC) 15 MG tablet, Take 1 tablet (15 mg total) by mouth daily., Disp: 30 tablet, Rfl: 0   methylPREDNISolone (MEDROL DOSEPAK) 4 MG TBPK tablet, Take as directed, Disp: 21 tablet, Rfl: 0   ondansetron (ZOFRAN) 4 MG tablet, Take 1 tablet  (4 mg total) by mouth every 8 (eight) hours as needed for nausea or vomiting., Disp: 20 tablet, Rfl: 0   tirzepatide (MOUNJARO) 2.5 MG/0.5ML Pen, Inject 2.5 mg into the skin once a week., Disp: , Rfl:    vitamin C (ASCORBIC ACID) 500 MG tablet, Take 500 mg by mouth daily., Disp: , Rfl:   Current Facility-Administered Medications:    dexamethasone (DECADRON) injection 4 mg, 4 mg, Intra-articular, Once,    triamcinolone acetonide (KENALOG) 10 MG/ML injection 2.5 mg, 2.5 mg, Intra-articular, Once,   Social History   Tobacco Use  Smoking Status Never  Smokeless Tobacco Never    Allergies  Allergen Reactions   Lactose Intolerance (Gi)    Objective:  There were no vitals filed for this visit. There is no height or weight on file to calculate BMI. Constitutional Well developed. Well nourished.  Vascular Foot warm and well perfused. Capillary refill normal to all digits.   Neurologic Normal speech. Oriented to person, place, and time. Epicritic sensation to light touch grossly present bilaterally.  Dermatologic Skin healing well without signs of infection. Skin edges well coapted without signs of infection.  Orthopedic: Tenderness to palpation noted about the surgical site.   Radiographs: No interval changes on bony structure Assessment:   1. Post-operative state    Plan:  Patient was evaluated and treated and all questions answered.  S/p foot surgery left -Progressing as expected post-operatively. -  WB Status: WBAT in surgical shoe -Sutures: intact. -Medications: n/a -Foot redressed.  Return in 2 weeks for suture removal.   No follow-ups on file.

## 2023-06-09 NOTE — Patient Instructions (Signed)
Lef

## 2023-06-13 ENCOUNTER — Telehealth: Payer: Self-pay | Admitting: Family Medicine

## 2023-06-13 NOTE — Telephone Encounter (Signed)
 Patient is scheduled for next Zirletta on 07/01/23. Can you confirm authorization?

## 2023-06-21 NOTE — Telephone Encounter (Signed)
 Per visit note  Assessment and Plan: 63 y.o. female with right knee pain due to predominantly DJD.  She does have meniscus tear and degeneration which I am sure is contributory but the main issue is knee arthritis.  Plan for Zilretta  injection today.  Continue off loader knee brace which has been helpful.  If this is not working would consider surgical consultation.  Ultimately I think she will need a knee replacement.   Per visit note 11/11/22:  Assessment and Plan: 63 y.o. female with right knee pain thought to be due to exacerbation of underlying DJD and degenerative meniscus tearing.  She had an MRI about 2 years ago which did show a medial meniscus tear.  Ultimately she had a surgical consultation and they decided to proceed with conservative management.  Symptoms worsened about 3 months ago after a fall.  She had a steroid injection in June which provided pain relief for about 2 months.  We discussed options.  Plan for steroid injection today.  Will work on authorization for hyaluronic acid injections and Zilretta  injections as is likely today's injection will not last 3 months.  Additionally will investigate medial off loader knee brace.  Her laxity and DJD would both benefit from this knee bracing.

## 2023-06-21 NOTE — Telephone Encounter (Signed)
 Patients insurance states can only receive zilretta  one time. I do not know what Dr. Alease Hunter would like to do for patient at her visit in May. I will run for gel just in case

## 2023-06-21 NOTE — Telephone Encounter (Signed)
 Sorry for the misunderstanding, I was just trying to let you know that Gelsyn had been approved in the past just in case that's the gel shot you wanted to run for now.

## 2023-06-21 NOTE — Telephone Encounter (Signed)
 Bri, it looks like she was originally approved for Gelsyn but opted to proceed with Zilretta .  GELSYN for RIGHT knee OA  Also checked Zilretta    Primary Insurance: [employer]  Co-pay: $50 per visit Co-insurance: 0% Deductible: does not apply Prior Auth: APPROVED Gelsyn for RIGHT knee OA APPROVED PA# [id number] Valid: 11/23/22-05/24/23

## 2023-06-22 NOTE — Telephone Encounter (Signed)
 Noted! Ran benefits for gelsyn just waiting for response from insurance

## 2023-06-23 ENCOUNTER — Ambulatory Visit (INDEPENDENT_AMBULATORY_CARE_PROVIDER_SITE_OTHER): Admitting: Podiatry

## 2023-06-23 ENCOUNTER — Encounter: Payer: Self-pay | Admitting: Podiatry

## 2023-06-23 DIAGNOSIS — Z9889 Other specified postprocedural states: Secondary | ICD-10-CM

## 2023-06-23 NOTE — Progress Notes (Signed)
 Subjective:  Patient ID: Stacy Preston, female    DOB: 1960/11/01,  MRN: [id number]  Chief Complaint  Patient presents with   Routine Post Op    POV # 2 DOS 05/31/23 --- LEFT PLANTAR FASCIOTOMY/ pt states she feels a little tightness around the area but overall she thinks she is getting better.     DOS: 05/31/23 Procedure: Left plantar fasciotomy  63 y.o. female returns for POV#2. Relates doing well.   Review of Systems: Negative except as noted in the HPI. Denies N/V/F/Ch.  Past Medical History:  Diagnosis Date   Allergy    History of basal cell carcinoma 01/09/2016   Hypertension    Infertility, female    Post-operative nausea and vomiting    Prolactinoma (HCC)    Vitamin D  deficiency 01/09/2016    Current Outpatient Medications:    ANUCORT-HC  25 MG suppository, INSERT 1 SUPPOSITORY RECTALLY TWICE DAILY AS NEEDED FOR HEMORRHOIDS, Disp: 24 suppository, Rfl: 0   Betamethasone Diprop-Minoxidil 0.05-5 % SOLN, Apply topically., Disp: , Rfl:    cholecalciferol (VITAMIN D ) 1000 UNITS tablet, Take 2,000 Units by mouth daily., Disp: , Rfl:    estradiol  (ESTRACE ) 0.1 MG/GM vaginal cream, Per vagina three times per week (Patient taking differently: 2 (two) times a week. Per vagina three times per week), Disp: 42.5 g, Rfl: 12   finasteride (PROSCAR) 5 MG tablet, Take 2.5 mg by mouth daily., Disp: , Rfl:    linaclotide  (LINZESS ) 290 MCG CAPS capsule, Take 1 capsule (290 mcg total) by mouth daily before breakfast. Patient needs follow up appointment for future refills. Please call [phone] to schedule an appointment., Disp: 30 capsule, Rfl: 0   losartan  (COZAAR ) 25 MG tablet, Take 1 tablet by mouth once daily, Disp: 90 tablet, Rfl: 1   MAGNESIUM PO, Take 200 mg by mouth daily at 6 (six) AM., Disp: , Rfl:    medium chain triglycerides (MCT OIL) oil, Take by mouth 3 (three) times daily., Disp: , Rfl:    meloxicam  (MOBIC ) 15 MG tablet, Take 1 tablet (15 mg total) by mouth daily., Disp:  30 tablet, Rfl: 0   methylPREDNISolone  (MEDROL  DOSEPAK) 4 MG TBPK tablet, Take as directed, Disp: 21 tablet, Rfl: 0   ondansetron  (ZOFRAN ) 4 MG tablet, Take 1 tablet (4 mg total) by mouth every 8 (eight) hours as needed for nausea or vomiting., Disp: 20 tablet, Rfl: 0   tirzepatide  (MOUNJARO) 2.5 MG/0.5ML Pen, Inject 2.5 mg into the skin once a week., Disp: , Rfl:    vitamin C (ASCORBIC ACID) 500 MG tablet, Take 500 mg by mouth daily., Disp: , Rfl:   Current Facility-Administered Medications:    dexamethasone  (DECADRON ) injection 4 mg, 4 mg, Intra-articular, Once,    triamcinolone  acetonide (KENALOG ) 10 MG/ML injection 2.5 mg, 2.5 mg, Intra-articular, Once,   Social History   Tobacco Use  Smoking Status Never  Smokeless Tobacco Never    Allergies  Allergen Reactions   Lactose Intolerance (Gi)    Objective:  There were no vitals filed for this visit. There is no height or weight on file to calculate BMI. Constitutional Well developed. Well nourished.  Vascular Foot warm and well perfused. Capillary refill normal to all digits.   Neurologic Normal speech. Oriented to person, place, and time. Epicritic sensation to light touch grossly present bilaterally.  Dermatologic Skin healing well without signs of infection. Skin edges well coapted without signs of infection.  Orthopedic: Tenderness to palpation noted about the surgical site.   Radiographs:  No interval changes on bony structure Assessment:   1. Post-operative state     Plan:  Patient was evaluated and treated and all questions answered.  S/p foot surgery left -Progressing as expected post-operatively. -WB Status: WBAT in regular shoe -Sutures:removed without incident.  -Medications: n/a -Foot redressed.  Return in .4 weeks for recheck  No follow-ups on file.

## 2023-06-29 NOTE — Telephone Encounter (Signed)
 noted

## 2023-06-29 NOTE — Telephone Encounter (Signed)
 Pt cancelled 5/9 appt, please let her know when gelsyn approved.  Also, informed pt that Zilretta  is a "once in a lifetime" benefit per her insurance. Pt states she is reaching out to her [employer] contact in HR to see if there is anything she can do about this restriction.

## 2023-06-30 NOTE — Telephone Encounter (Signed)
 Pt called, would like to pursue the appeal for the Zilretta . I was unsure if we start this process or how to advise her.

## 2023-07-01 ENCOUNTER — Ambulatory Visit: Admitting: Family Medicine

## 2023-07-05 NOTE — Telephone Encounter (Signed)
 Message sent to patient

## 2023-07-06 ENCOUNTER — Ambulatory Visit: Admitting: Gastroenterology

## 2023-07-06 ENCOUNTER — Encounter: Payer: Self-pay | Admitting: Gastroenterology

## 2023-07-06 VITALS — BP 124/74 | HR 85 | Ht 63.0 in | Wt 155.0 lb

## 2023-07-06 DIAGNOSIS — Z800 Family history of malignant neoplasm of digestive organs: Secondary | ICD-10-CM

## 2023-07-06 DIAGNOSIS — K581 Irritable bowel syndrome with constipation: Secondary | ICD-10-CM

## 2023-07-06 DIAGNOSIS — K5909 Other constipation: Secondary | ICD-10-CM | POA: Diagnosis not present

## 2023-07-06 DIAGNOSIS — R102 Pelvic and perineal pain: Secondary | ICD-10-CM | POA: Diagnosis not present

## 2023-07-06 DIAGNOSIS — G8929 Other chronic pain: Secondary | ICD-10-CM | POA: Diagnosis not present

## 2023-07-06 DIAGNOSIS — Z860100 Personal history of colon polyps, unspecified: Secondary | ICD-10-CM

## 2023-07-06 MED ORDER — LINACLOTIDE 290 MCG PO CAPS: 290.0000 ug | ORAL_CAPSULE | Freq: Every day | ORAL | 0 refills | Status: DC

## 2023-07-06 MED ORDER — LINACLOTIDE 290 MCG PO CAPS: 290.0000 ug | ORAL_CAPSULE | Freq: Every day | ORAL | 0 refills | Status: AC

## 2023-07-06 NOTE — Progress Notes (Signed)
 Chief Complaint: annual follow-up Primary GI Doctor: Dr. Karene Oto   HPI:  63 year old female with a history of BCC, HTN, vitamin D  deficiency. Family history of colon cancer in her maternal grandfather.  Last seen in GI office by Dr. Karene Oto on 03/11/22 for lower abd/pelvic pain.  History of chronic pelvic pain which started 02/2020 following car ride to [place] with subsequent work-up as below. Pelvic pain tends to be after BM, intercourse.  Does have associated constipation and decreased stool frequency.  - 03/15/2020: Diagnosed with T12 compression fracture by x-ray  -05/15/2020: CT A/P: Hepatic steatosis, scattered hepatic cysts the largest measuring 2.4 cm.  Diverticulosis without diverticulitis, otherwise normal GI tract. T12 compression fx - 05/24/2020: MRI L-spine: Multilevel spondylosis, mild foraminal narrowing bilaterally at L2-3 secondary to disc bulging and facet hypertrophy.  Disc extrusion at L3-4 with some encroachment on central canal.  Foraminal narrowing L3-4, mild and moderate left foraminal narrowing at L4-5, moderate right and mild left foraminal stenosis at L5-S1. - 07/2020 pelvic floor PT for back pain. Went to a couple sessions, but stopped when she tore her meniscus. Back pain has resolved. - 07/2020: Colonoscopy with 2 tubular adenomas.  Repeat 7 years - 01/2021: Lumbar epidural injection on left side - 02/2021: Hysteroscopy, biopsy, D&C, laparoscopy. Studies were negative and no endometriosis and suspected pelvic pain due to pelvic floor muscle spasm and recommended pelvic PT - 11/2021: MRI T-spine/L-spine with mild degenerative changes without high-grade stenosis --06/04/22 US  Abd limited- ventral hernia, contains fat. The hernia sac measures 3.8 x 1.3 by 3.8 cm.  -Colonoscopy 2005 unremarkable per patient -Colonoscopy 09/2011 in [place]  notable for polyp (pathology unknown) -Colonoscopy in 2017 at [facility] normal.  Recommended repeat in 5 years due to prior  history of polyps - Colonoscopy 07/2020: 3 small polyps (path: TA x2), 8 mm sigmoid polyp (path: HP), 3 mm rectal polyp (path: HP).  Internal hemorrhoids.  Repeat in 7 years  Interval History    Patient has chronic constipation and done well since Dr. Karene Oto increased the dosage of Linzess  to 290mcg.  She states she has continued with the lower pelvic pain despite better bowel routine. The pain occurs right before bowel movement and will persist for the next day.    She has  f/u with GYN, Sports Med, PCM, Pelvic PT and negative w/u. Underwent MRI T/L spine last October. She also tells me she had painful menstrual cycles when she was younger and History of endometriosis, she had few procedures to address this issue in past.  Wt Readings from Last 3 Encounters:  07/06/23 155 lb (70.3 kg)  04/01/23 165 lb (74.8 kg)  01/12/23 172 lb (78 kg)    Past Medical History:  Diagnosis Date   Allergy    History of basal cell carcinoma 01/09/2016   Hypertension    Infertility, female    Post-operative nausea and vomiting    Prolactinoma (HCC)    Vitamin D  deficiency 01/09/2016    Past Surgical History:  Procedure Laterality Date   BREAST BIOPSY Right 12/29/2020   COLONOSCOPY  2013, 2017   [place]   INSERTION OF MESH N/A 07/21/2022   Procedure: INSERTION OF MESH;  Surgeon: Caralyn Chandler, MD;  Location: [facility];  Service: General;  Laterality: N/A;   ivf     LAPAROSCOPY     for endometriosis   PLANTAR FASCIA RELEASE  05/2023   PLANTAR FASCIA SURGERY     UMBILICAL HERNIA REPAIR N/A  07/21/2022   Procedure: HERNIA REPAIR UMBILICAL ADULT WITH POSSIBLE MESH;  Surgeon: Caralyn Chandler, MD;  Location: [facility];  Service: General;  Laterality: N/A;    Current Outpatient Medications  Medication Sig Dispense Refill   ANUCORT-HC  25 MG suppository INSERT 1 SUPPOSITORY RECTALLY TWICE DAILY AS NEEDED FOR HEMORRHOIDS 24 suppository 0   cholecalciferol (VITAMIN D )  1000 UNITS tablet Take 2,000 Units by mouth daily.     estradiol  (ESTRACE ) 0.1 MG/GM vaginal cream Per vagina three times per week (Patient taking differently: 2 (two) times a week. Per vagina three times per week) 42.5 g 12   finasteride (PROSCAR) 5 MG tablet Take 2.5 mg by mouth daily.     linaclotide  (LINZESS ) 290 MCG CAPS capsule Take 1 capsule (290 mcg total) by mouth daily before breakfast. Patient needs follow up appointment for future refills. Please call [phone] to schedule an appointment. 30 capsule 0   losartan  (COZAAR ) 25 MG tablet Take 1 tablet by mouth once daily 90 tablet 1   MAGNESIUM PO Take 200 mg by mouth daily at 6 (six) AM.     medium chain triglycerides (MCT OIL) oil Take by mouth 3 (three) times daily.     tirzepatide  (MOUNJARO) 2.5 MG/0.5ML Pen Inject 2.5 mg into the skin once a week.     vitamin C (ASCORBIC ACID) 500 MG tablet Take 500 mg by mouth daily.     Current Facility-Administered Medications  Medication Dose Route Frequency Provider Last Rate Last Admin   dexamethasone  (DECADRON ) injection 4 mg  4 mg Intra-articular Once        triamcinolone  acetonide (KENALOG ) 10 MG/ML injection 2.5 mg  2.5 mg Intra-articular Once         Allergies as of 07/06/2023 - Review Complete 07/06/2023  Allergen Reaction Noted   Lactose intolerance (gi)  09/04/2015    Family History  Problem Relation Age of Onset   Renal Disease Mother    Diabetes Mother    Hypertension Mother    Cancer Father        Lung CA   Alcohol abuse Brother    Hypertension Brother    Sickle cell anemia Brother    Cancer Brother    Colon polyps Brother    Heart disease Maternal Grandmother    Diabetes Maternal Grandmother    Cancer Maternal Grandfather    Diabetes Maternal Grandfather    Colon cancer Maternal Grandfather 36 - 79   Stroke Paternal Grandmother    Heart disease Paternal Grandfather    Diabetes Maternal Aunt    Esophageal cancer Neg Hx    Rectal cancer Neg Hx    Stomach  cancer Neg Hx     Review of Systems:    Constitutional: No weight loss, fever, chills, weakness or fatigue HEENT: Eyes: No change in vision               Ears, Nose, Throat:  No change in hearing or congestion Skin: No rash or itching Cardiovascular: No chest pain, chest pressure or palpitations   Respiratory: No SOB or cough Gastrointestinal: See HPI and otherwise negative Genitourinary: No dysuria or change in urinary frequency Neurological: No headache, dizziness or syncope Musculoskeletal: No new muscle or joint pain Hematologic: No bleeding or bruising Psychiatric: No history of depression or anxiety    Physical Exam:  Vital signs: BP 124/74   Pulse 85   Ht 5\' 3"  (1.6 m)   Wt 155 lb (70.3 kg)   SpO2  99%   BMI 27.46 kg/m   Constitutional:   Pleasant  female appears to be in NAD, Well developed, Well nourished, alert and cooperative Throat: Oral cavity and pharynx without inflammation, swelling or lesion.  Respiratory: Respirations even and unlabored. Lungs clear to auscultation bilaterally.   No wheezes, crackles, or rhonchi.  Cardiovascular: Normal S1, S2. Regular rate and rhythm. No peripheral edema, cyanosis or pallor.  Gastrointestinal:  Soft, nondistended, nontender. No rebound or guarding. Normal bowel sounds. No appreciable masses or hepatomegaly. Rectal:  Not performed.  Msk:  Symmetrical without gross deformities. Without edema, no deformity or joint abnormality.  Neurologic:  Alert and  oriented x4;  grossly normal neurologically.  Skin:   Dry and intact without significant lesions or rashes. Psychiatric: Oriented to person, place and time. Demonstrates good judgement and reason without abnormal affect or behaviors.  RELEVANT LABS AND IMAGING: CBC    Latest Ref Rng & Units 01/12/2023    9:06 AM 01/08/2022   12:00 AM 01/06/2021   12:00 AM  CBC  WBC 3.4 - 10.8 x10E3/uL 5.9  6.8  8.6   Hemoglobin 11.1 - 15.9 g/dL 40.9  81.1  91.4   Hematocrit 34.0 - 46.6 %  41.7  40.2  39.5   Platelets 150 - 450 x10E3/uL 259  278  282      CMP     Latest Ref Rng & Units 01/12/2023    9:06 AM 07/14/2022   12:48 PM 06/03/2022   10:18 AM  CMP  Glucose 70 - 99 mg/dL 96  94  90   BUN 8 - 27 mg/dL 17  21  21    Creatinine 0.57 - 1.00 mg/dL 7.82  9.56  2.13   Sodium 134 - 144 mmol/L 141  137  141   Potassium 3.5 - 5.2 mmol/L 4.6  5.1  4.3   Chloride 96 - 106 mmol/L 105  102  106   CO2 20 - 29 mmol/L 25  26  27    Calcium 8.7 - 10.3 mg/dL 8.7  9.4  9.4   Total Protein 6.0 - 8.5 g/dL 6.6   7.1   Total Bilirubin 0.0 - 1.2 mg/dL 0.6   0.6   Alkaline Phos 44 - 121 IU/L 72     AST 0 - 40 IU/L 20   18   ALT 0 - 32 IU/L 18   14      Lab Results  Component Value Date   TSH 1.370 01/12/2023     Assessment:    63 year old female patient with history of chronic constipation and lower abdominal/pelvis pain with extensive evaluation. She has responded well to Linzess  290 mcg po daily for the constipation but it does not help alleviate the pain. She enquires about other conservative therapies and we discussed massage therapy. We also discussed considering Tricyclic Antidepressants (TCAs) and Selective Serotonin Reuptake Inhibitors (SSRIs), like Celexa (citalopram) for her chronic abdominal/spastic pain that occurs with defecation as an alternative option. She would like to hold off on any medication for now.  She also mentions history of colon polyps and colon cancer in her grandfather.  She states she had a discussion with her GYN and recommended she do colon screenings every 5 years.  Will defer to Dr. Karene Oto and they can discuss at her 1 year follow-up.  Plan: -Continue Linzess  290mcg po daily, refilled -OTC Miralax prn as needed -Recommend abdominal massage therapy -Ensure that you are drinking at least 64 ounces of water  daily  -We discussed considering low dose TCA or SSRI's, will hold off for now -Follow-up 1 year and prn  Thank you for the courtesy of this  consult. Please call me with any questions or concerns.   Stacy Belmonte, FNP-C [facility] 07/06/2023, 11:28 AM  Cc: Stacy Holter, DO

## 2023-07-06 NOTE — Patient Instructions (Addendum)
 Continue Linzess  290mcg po daily Recommend abdominal massage therapy Ensure that you are drinking at least 64 ounces of water daily   _______________________________________________________  If your blood pressure at your visit was 140/90 or greater, please contact your primary care physician to follow up on this.  _______________________________________________________  If you are age 64 or older, your body mass index should be between 23-30. Your Body mass index is 27.46 kg/m. If this is out of the aforementioned range listed, please consider follow up with your Primary Care Provider.  If you are age 71 or younger, your body mass index should be between 19-25. Your Body mass index is 27.46 kg/m. If this is out of the aformentioned range listed, please consider follow up with your Primary Care Provider.   ________________________________________________________  The [facility] providers would like to encourage you to use MYCHART to communicate with providers for non-urgent requests or questions.  Due to long hold times on the telephone, sending your provider a message by [facility] may be a faster and more efficient way to get a response.  Please allow 48 business hours for a response.  Please remember that this is for non-urgent requests.  _______________________________________________________ Thank you for trusting me with your gastrointestinal care. Deanna May, RNP

## 2023-07-11 ENCOUNTER — Telehealth: Payer: Self-pay | Admitting: Gastroenterology

## 2023-07-11 NOTE — Telephone Encounter (Signed)
 Patient called and stated that she was seen with Stacy Preston and Stacy Preston had refilled her Linzess  medication for only a 60 day supply instead of a 90 day supply like she had before. Patient is requesting a call back. Please advise.

## 2023-07-13 ENCOUNTER — Other Ambulatory Visit: Payer: Self-pay

## 2023-07-13 DIAGNOSIS — K581 Irritable bowel syndrome with constipation: Secondary | ICD-10-CM

## 2023-07-13 MED ORDER — LINACLOTIDE 290 MCG PO CAPS: 290.0000 ug | ORAL_CAPSULE | Freq: Every day | ORAL | 3 refills | Status: AC

## 2023-07-13 NOTE — Progress Notes (Signed)
 Med Refill

## 2023-07-14 NOTE — Progress Notes (Signed)
 Agree with the assessment and plan as outlined by [facility], FNP-C.  Can discuss the role/utility of changing surveillance to 5-year interval at her follow-up appointment with me based on her history of adenomatous polyps and family history of grandparent with colon cancer.  Kendelle Schweers, DO, [facility]

## 2023-07-21 ENCOUNTER — Telehealth: Payer: Self-pay | Admitting: Podiatry

## 2023-07-21 NOTE — Telephone Encounter (Signed)
 Pt was scheduled for her pov # 3 tomorrow but asked to change it to next week as her daughter is in the hospital having a baby. Its her first grandbaby.  I moved appt. Pt does not have stitches still I did confirm

## 2023-07-22 ENCOUNTER — Encounter: Admitting: Podiatry

## 2023-07-26 ENCOUNTER — Ambulatory Visit: Admitting: Behavioral Health

## 2023-07-28 ENCOUNTER — Ambulatory Visit (INDEPENDENT_AMBULATORY_CARE_PROVIDER_SITE_OTHER): Admitting: Podiatry

## 2023-07-28 ENCOUNTER — Encounter: Payer: Self-pay | Admitting: Podiatry

## 2023-07-28 DIAGNOSIS — Z9889 Other specified postprocedural states: Secondary | ICD-10-CM

## 2023-07-28 NOTE — Progress Notes (Signed)
  Subjective:  Patient ID: Stacy Preston, female    DOB: Apr 07, 1960,  MRN: [id number]  No chief complaint on file.   DOS: 05/31/23 Procedure: Left plantar fasciotomy  63 y.o. female returns for POV#3. Relates doing well.   Review of Systems: Negative except as noted in the HPI. Denies N/V/F/Ch.  Past Medical History:  Diagnosis Date   Allergy    History of basal cell carcinoma 01/09/2016   Hypertension    Infertility, female    Post-operative nausea and vomiting    Prolactinoma (HCC)    Vitamin D  deficiency 01/09/2016    Current Outpatient Medications:    ANUCORT-HC  25 MG suppository, INSERT 1 SUPPOSITORY RECTALLY TWICE DAILY AS NEEDED FOR HEMORRHOIDS, Disp: 24 suppository, Rfl: 0   cholecalciferol (VITAMIN D ) 1000 UNITS tablet, Take 2,000 Units by mouth daily., Disp: , Rfl:    estradiol  (ESTRACE ) 0.1 MG/GM vaginal cream, Per vagina three times per week (Patient taking differently: 2 (two) times a week. Per vagina three times per week), Disp: 42.5 g, Rfl: 12   finasteride (PROSCAR) 5 MG tablet, Take 2.5 mg by mouth daily., Disp: , Rfl:    linaclotide  (LINZESS ) 290 MCG CAPS capsule, Take 1 capsule (290 mcg total) by mouth daily before breakfast. Patient needs follow up appointment for future refills. Please call [phone] to schedule an appointment., Disp: 30 capsule, Rfl: 0   linaclotide  (LINZESS ) 290 MCG CAPS capsule, Take 1 capsule (290 mcg total) by mouth daily before breakfast., Disp: 90 capsule, Rfl: 3   losartan  (COZAAR ) 25 MG tablet, Take 1 tablet by mouth once daily, Disp: 90 tablet, Rfl: 1   MAGNESIUM PO, Take 200 mg by mouth daily at 6 (six) AM., Disp: , Rfl:    medium chain triglycerides (MCT OIL) oil, Take by mouth 3 (three) times daily., Disp: , Rfl:    tirzepatide  (MOUNJARO) 2.5 MG/0.5ML Pen, Inject 2.5 mg into the skin once a week., Disp: , Rfl:    vitamin C (ASCORBIC ACID) 500 MG tablet, Take 500 mg by mouth daily., Disp: , Rfl:   Current  Facility-Administered Medications:    dexamethasone  (DECADRON ) injection 4 mg, 4 mg, Intra-articular, Once,    triamcinolone  acetonide (KENALOG ) 10 MG/ML injection 2.5 mg, 2.5 mg, Intra-articular, Once,   Social History   Tobacco Use  Smoking Status Never  Smokeless Tobacco Never    Allergies  Allergen Reactions   Lactose Intolerance (Gi)    Objective:  There were no vitals filed for this visit. There is no height or weight on file to calculate BMI. Constitutional Well developed. Well nourished.  Vascular Foot warm and well perfused. Capillary refill normal to all digits.   Neurologic Normal speech. Oriented to person, place, and time. Epicritic sensation to light touch grossly present bilaterally.  Dermatologic Skin healing well without signs of infection. Skin edges well coapted without signs of infection.  Orthopedic: Tenderness to palpation noted about the surgical site.   Radiographs: No interval changes on bony structure Assessment:   1. Post-operative state     Plan:  Patient was evaluated and treated and all questions answered.  S/p foot surgery left -Progressing as expected post-operatively. -WB Status: WBAT in regular shoe  Return in .5 weeks for recheck  No follow-ups on file.

## 2023-08-08 ENCOUNTER — Other Ambulatory Visit: Payer: Self-pay | Admitting: Family Medicine

## 2023-08-10 NOTE — Progress Notes (Signed)
   Joanna Muck, PhD, LAT, ATC acting as a scribe for Garlan Juniper, MD.  Stacy Preston is a 63 y.o. female who presents to [facility] at [facility] today for exacerbation of her R knee pain. Pt was last seen by Dr. Alease Hunter on 04/01/23 and was given a R knee Zilretta  injection.   Today, pt reports ***  Dx imaging: 11/21/22 R knee MRI 07/30/22 R knee XR 08/09/20 R knee MRI             08/03/20 R knee XR             09/26/18 R knee XR  Pertinent review of systems: ***  Relevant historical information: ***   Exam:  There were no vitals taken for this visit. General: Well Developed, well nourished, and in no acute distress.   MSK: ***    Lab and Radiology Results No results found for this or any previous visit (from the past 72 hours). No results found.     Assessment and Plan: 63 y.o. female with ***   PDMP not reviewed this encounter. No orders of the defined types were placed in this encounter.  No orders of the defined types were placed in this encounter.    Discussed warning signs or symptoms. Please see discharge instructions. Patient expresses understanding.   ***

## 2023-08-11 ENCOUNTER — Other Ambulatory Visit: Payer: Self-pay

## 2023-08-11 ENCOUNTER — Ambulatory Visit: Admitting: Family Medicine

## 2023-08-11 ENCOUNTER — Ambulatory Visit (INDEPENDENT_AMBULATORY_CARE_PROVIDER_SITE_OTHER)

## 2023-08-11 ENCOUNTER — Encounter: Payer: Self-pay | Admitting: Family Medicine

## 2023-08-11 VITALS — BP 118/72 | HR 85 | Ht 63.0 in | Wt 147.0 lb

## 2023-08-11 DIAGNOSIS — R0781 Pleurodynia: Secondary | ICD-10-CM | POA: Diagnosis not present

## 2023-08-11 DIAGNOSIS — G8929 Other chronic pain: Secondary | ICD-10-CM

## 2023-08-11 DIAGNOSIS — M1711 Unilateral primary osteoarthritis, right knee: Secondary | ICD-10-CM | POA: Diagnosis not present

## 2023-08-11 DIAGNOSIS — M25561 Pain in right knee: Secondary | ICD-10-CM | POA: Diagnosis not present

## 2023-08-11 NOTE — Patient Instructions (Addendum)
 Thank you for coming in today.   You received an injection today. Seek immediate medical attention if the joint becomes red, extremely painful, or is oozing fluid.   Let me know if you need PT referral.   If you want to do Zilretta  in the future out of pocket let me know   PRP injection is around $500 but I dont know if it will work.

## 2023-08-22 ENCOUNTER — Ambulatory Visit: Payer: Self-pay | Admitting: Family Medicine

## 2023-08-22 NOTE — Progress Notes (Signed)
 Right rib x-ray looks okay to radiology.

## 2023-08-29 ENCOUNTER — Ambulatory Visit: Admitting: Behavioral Health

## 2023-09-02 ENCOUNTER — Encounter: Payer: Self-pay | Admitting: Podiatry

## 2023-09-02 ENCOUNTER — Ambulatory Visit (INDEPENDENT_AMBULATORY_CARE_PROVIDER_SITE_OTHER): Admitting: Podiatry

## 2023-09-02 ENCOUNTER — Telehealth: Payer: Self-pay | Admitting: Family Medicine

## 2023-09-02 DIAGNOSIS — Z9889 Other specified postprocedural states: Secondary | ICD-10-CM

## 2023-09-02 NOTE — Telephone Encounter (Signed)
 Patient called and asked how often she can get a cortisone injection. She got an injection recently and a week later she fell on that knee and it is aggravated again and she does not know if it is too soon to get another injection. Please advise.

## 2023-09-02 NOTE — Telephone Encounter (Signed)
 Last seen 08/11/23 and received Triamcinolone  injection.   Forwarding to Dr. Joane to review and advise.

## 2023-09-02 NOTE — Progress Notes (Signed)
  Subjective:  Patient ID: Stacy Preston, female    DOB: [birth year],  MRN: [id number]  Chief Complaint  Patient presents with   Plantar Fasciitis    DOS 05/31/2023  LEFT PLANTAR FASCIOTOMY It's pretty good.    DOS: 05/31/23 Procedure: Left plantar fasciotomy  63 y.o. female returns for POV#4. Relates doing well.   Review of Systems: Negative except as noted in the HPI. Denies N/V/F/Ch.  Past Medical History:  Diagnosis Date   Allergy    History of basal cell carcinoma 01/09/2016   Hypertension    Infertility, female    Post-operative nausea and vomiting    Prolactinoma (HCC)    Vitamin D  deficiency 01/09/2016    Current Outpatient Medications:    ANUCORT-HC  25 MG suppository, INSERT 1 SUPPOSITORY RECTALLY TWICE DAILY AS NEEDED FOR HEMORRHOIDS, Disp: 24 suppository, Rfl: 0   cholecalciferol (VITAMIN D ) 1000 UNITS tablet, Take 2,000 Units by mouth daily., Disp: , Rfl:    estradiol  (ESTRACE ) 0.1 MG/GM vaginal cream, Per vagina three times per week (Patient taking differently: 2 (two) times a week. Per vagina three times per week), Disp: 42.5 g, Rfl: 12   finasteride (PROSCAR) 5 MG tablet, Take 2.5 mg by mouth daily., Disp: , Rfl:    linaclotide  (LINZESS ) 290 MCG CAPS capsule, Take 1 capsule (290 mcg total) by mouth daily before breakfast. Patient needs follow up appointment for future refills. Please call [phone] to schedule an appointment., Disp: 30 capsule, Rfl: 0   linaclotide  (LINZESS ) 290 MCG CAPS capsule, Take 1 capsule (290 mcg total) by mouth daily before breakfast., Disp: 90 capsule, Rfl: 3   losartan  (COZAAR ) 25 MG tablet, Take 1 tablet by mouth once daily, Disp: 90 tablet, Rfl: 0   MAGNESIUM PO, Take 200 mg by mouth daily at 6 (six) AM., Disp: , Rfl:    medium chain triglycerides (MCT OIL) oil, Take by mouth 3 (three) times daily., Disp: , Rfl:    tirzepatide  (MOUNJARO) 2.5 MG/0.5ML Pen, Inject 2.5 mg into the skin once a week., Disp: , Rfl:    vitamin C  (ASCORBIC ACID) 500 MG tablet, Take 500 mg by mouth daily., Disp: , Rfl:   Current Facility-Administered Medications:    dexamethasone  (DECADRON ) injection 4 mg, 4 mg, Intra-articular, Once,    triamcinolone  acetonide (KENALOG ) 10 MG/ML injection 2.5 mg, 2.5 mg, Intra-articular, Once,   Social History   Tobacco Use  Smoking Status Never  Smokeless Tobacco Never    Allergies  Allergen Reactions   Lactose Intolerance (Gi)    Objective:  There were no vitals filed for this visit. There is no height or weight on file to calculate BMI. Constitutional Well developed. Well nourished.  Vascular Foot warm and well perfused. Capillary refill normal to all digits.   Neurologic Normal speech. Oriented to person, place, and time. Epicritic sensation to light touch grossly present bilaterally.  Dermatologic Skin healing well without signs of infection. Skin edges well coapted without signs of infection.  Orthopedic: Tenderness to palpation noted about the surgical site.   Radiographs: No interval changes on bony structure Assessment:   1. Post-operative state     Plan:  Patient was evaluated and treated and all questions answered.  S/p foot surgery left -Progressing as expected post-operatively. -WB Status: WBAT in regular shoe  Patient discharged from surgical stnadpoing.   No follow-ups on file.

## 2023-09-05 NOTE — Telephone Encounter (Signed)
 Forwarding to Dr. Alease Hunter to prescribe.

## 2023-09-05 NOTE — Telephone Encounter (Signed)
 We can do it every 3 months which would be middle part of September.

## 2023-09-06 MED ORDER — MELOXICAM 15 MG PO TABS: ORAL_TABLET | ORAL | 3 refills | Status: DC

## 2023-09-06 MED ORDER — PREDNISONE 50 MG PO TABS: 50.0000 mg | ORAL_TABLET | Freq: Every day | ORAL | 0 refills | Status: DC

## 2023-09-06 NOTE — Addendum Note (Signed)
 Addended by: JOANE ARTIST RAMAN on: 09/06/2023 07:31 AM   Modules accepted: Orders

## 2023-09-13 ENCOUNTER — Encounter: Admitting: Physical Therapy

## 2023-09-27 ENCOUNTER — Encounter: Payer: Self-pay | Admitting: Behavioral Health

## 2023-09-27 ENCOUNTER — Ambulatory Visit (INDEPENDENT_AMBULATORY_CARE_PROVIDER_SITE_OTHER): Admitting: Behavioral Health

## 2023-09-27 DIAGNOSIS — F411 Generalized anxiety disorder: Secondary | ICD-10-CM

## 2023-09-27 DIAGNOSIS — F4322 Adjustment disorder with anxiety: Secondary | ICD-10-CM

## 2023-09-27 NOTE — Progress Notes (Signed)
 [facility] Counselor/Therapist Progress Note  Patient ID: MOSELLE RISTER, MRN: [id number],    Date: 09/27/2023  Time Spent: 3 PM until 3:55 PM, 55 minutes spent face-to-face with the patient in the outpatient therapist office.   Treatment Type: Individual Therapy  Reported Symptoms: Stress/anxiety  Mental Status Exam: Appearance:  Well Groomed     Behavior: Appropriate  Motor: Normal  Speech/Language:  Clear and Coherent  Affect: Appropriate  Mood: normal  Thought process: normal  Thought content:   WNL  Sensory/Perceptual disturbances:   WNL  Orientation: oriented to person, place, time/date, situation, day of week, month of year, and year  Attention: Good  Concentration: Good  Memory: WNL  Fund of knowledge:  Good  Insight:   Good  Judgment:  Good  Impulse Control: Good   Risk Assessment: Danger to Self:  No Self-injurious Behavior: No Danger to Others: No Duty to Warn:no Physical Aggression / Violence:No  Access to Firearms a concern: No  Gang Involvement:No  Subjective; the patient's daughter has had her baby the granddaughter is now about 71 weeks old.  The baby is doing well.  The daughter is doing well in terms of keeping infected changed and spending time with her but she is starting to see some signs of the daughter not necessarily being actively engaged with the baby.  Patient and her husband are helping as much as they can but they both work full-time from and have the limited time that they can spend with her.  The patient says she is trying to find the line of being supportive and helpful while also being encouraging to her daughter to start to take care of herself including getting her in to see a therapist.  Her daughter is working on her medication.  Her daughter is supposed to go back to work next week.  They are talking about options for the daughter and the granddaughter short and long-term.  The patient is trying to find time for a good  self-care and she and her husband are getting back into some of the routines that they had before with her daughter moved in with them.  Encouraged her to continue to practice self-care and set healthy boundaries to take care of herself.  Also encouraged her to try and compartmentalize not focusing too much the medium and long-range future look like until they can see how her daughter is going to do with her returning to work and having her granddaughter in daycare.  Patient does contract for safety having no thoughts of hurting herself or anyone else. Interventions: Cognitive Behavioral Therapy  Diagnosis: Generalized anxiety disorder  Plan: I .  Will meet with the patient every 2 weeks and she prefers that to be in person if possible.  Treatment plan: We will use cognitive behavioral therapy as well as elements of dialectical behavior therapy to reduce the patient's stress and anxiety level by at least 50% with a target date of August 22, 2023.  Goals will be to improve her ability to better manage anxiety symptoms and stress.  We will try to identify causes for anxiety and explore ways to reduce that as well as trying to resolve core conflicts contributing to the cause of anxiety.  We will use cognitive behavioral therapy to help her manage thoughts and worrisome thinking contributing to anxiety.  Interventions include providing education about anxiety to help her understand its causes symptoms and triggers, facilitate problem solution skills as well as teach coping skills to  help her manage anxiety.  We will use cognitive behavioral therapy to identify and change anxiety provoking thoughts as well as dialectical behavior therapy to help her with her distress tolerance and mindfulness skills.  Lorrene CHRISTELLA Hasten, [facility]                         Lorrene CHRISTELLA Hasten, [facility]

## 2023-09-30 ENCOUNTER — Encounter: Payer: Self-pay | Admitting: Family Medicine

## 2023-10-05 ENCOUNTER — Encounter: Payer: Self-pay | Admitting: Family Medicine

## 2023-10-06 NOTE — Telephone Encounter (Signed)
 Patient advised.

## 2023-10-10 ENCOUNTER — Ambulatory Visit: Admitting: Behavioral Health

## 2023-10-12 ENCOUNTER — Other Ambulatory Visit: Payer: Self-pay | Admitting: Medical Genetics

## 2023-10-13 ENCOUNTER — Encounter: Payer: Self-pay | Admitting: Family Medicine

## 2023-10-19 ENCOUNTER — Ambulatory Visit: Payer: Self-pay | Admitting: Family Medicine

## 2023-10-19 DIAGNOSIS — Z23 Encounter for immunization: Secondary | ICD-10-CM

## 2023-10-19 DIAGNOSIS — L659 Nonscarring hair loss, unspecified: Secondary | ICD-10-CM

## 2023-10-19 NOTE — Progress Notes (Signed)
 This just Stacy Preston - 63 y.o. female MRN [id number]  Date of birth: 1960-03-05  Subjective Chief Complaint  Patient presents with   Immunizations   Alopecia    HPI Stacy Preston is a 63 y.o. female here today for follow up visit.  She is concerned about continued hair loss.  Her she is planning on seeing dermatology would like to have labs checked initially.  She denies any other recent changes including changes to medications or supplements.    Recently had new grandchild.  Needs updated Tdap.  ROS:  A comprehensive ROS was completed and negative except as noted per HPI  Allergies  Allergen Reactions   Lactose Intolerance (Gi)     Past Medical History:  Diagnosis Date   Allergy    History of basal cell carcinoma 01/09/2016   Hypertension    Infertility, female    Post-operative nausea and vomiting    Prolactinoma (HCC)    Vitamin D  deficiency 01/09/2016    Past Surgical History:  Procedure Laterality Date   BREAST BIOPSY Right 12/29/2020   COLONOSCOPY  2013, 2017   [place]   INSERTION OF MESH N/A 07/21/2022   Procedure: INSERTION OF MESH;  Surgeon: Curvin Deward MOULD, MD;  Location: [facility];  Service: General;  Laterality: N/A;   ivf     LAPAROSCOPY     for endometriosis   PLANTAR FASCIA RELEASE  05/2023   PLANTAR FASCIA SURGERY     UMBILICAL HERNIA REPAIR N/A 07/21/2022   Procedure: HERNIA REPAIR UMBILICAL ADULT WITH POSSIBLE MESH;  Surgeon: Curvin Deward MOULD, MD;  Location:  SURGERY CENTER;  Service: General;  Laterality: N/A;    Social History   Socioeconomic History   Marital status: Married    Spouse name: Not on file   Number of children: Not on file   Years of education: Not on file   Highest education level: Not on file  Occupational History   Not on file  Tobacco Use   Smoking status: Never   Smokeless tobacco: Never  Vaping Use   Vaping status: Never Used  Substance and Sexual Activity   Alcohol use: Yes     Alcohol/week: 2.0 standard drinks of alcohol    Types: 2 Standard drinks or equivalent per week    Comment: socially   Drug use: No   Sexual activity: Yes    Birth control/protection: None  Other Topics Concern   Not on file  Social History Narrative   Not on file   Social Drivers of Health   Financial Resource Strain: Not on file  Food Insecurity: Not on file  Transportation Needs: Not on file  Physical Activity: Insufficiently Active (11/23/2017)   Exercise Vital Sign    Days of Exercise per Week: 1 day    Minutes of Exercise per Session: 40 min  Stress: Not on file  Social Connections: Unknown (07/07/2021)   Received from [facility]   Social Network    Social Network: Not on file    Family History  Problem Relation Age of Onset   Renal Disease Mother    Diabetes Mother    Hypertension Mother    Cancer Father        Lung CA   Alcohol abuse Brother    Hypertension Brother    Sickle cell anemia Brother    Cancer Brother    Colon polyps Brother    Heart disease Maternal Grandmother    Diabetes Maternal  Grandmother    Cancer Maternal Grandfather    Diabetes Maternal Grandfather    Colon cancer Maternal Grandfather 17 - 79   Stroke Paternal Grandmother    Heart disease Paternal Grandfather    Diabetes Maternal Aunt    Esophageal cancer Neg Hx    Rectal cancer Neg Hx    Stomach cancer Neg Hx     Health Maintenance  Topic Date Due   Zoster Vaccines- Shingrix (1 of 2) Never done   Pneumococcal Vaccine: 50+ Years (1 of 1 - PCV) Never done   INFLUENZA VACCINE  09/23/2023   COVID-19 Vaccine (4 - 2025-26 season) 10/24/2023   MAMMOGRAM  12/28/2024   Colonoscopy  08/05/2025   Cervical Cancer Screening (HPV/Pap Cotest)  01/24/2028   DTaP/Tdap/Td (3 - Td or Tdap) 10/18/2033   Hepatitis C Screening  Completed   HIV Screening  Completed   Hepatitis B Vaccines 19-59 Average Risk  Aged Out   HPV VACCINES  Aged Out   Meningococcal B Vaccine  Aged Out      ----------------------------------------------------------------------------------------------------------------------------------------------------------------------------------------------------------------- Physical Exam There were no vitals taken for this visit.  Physical Exam Constitutional:      Appearance: Normal appearance.  Cardiovascular:     Rate and Rhythm: Normal rate and regular rhythm.  Neurological:     General: No focal deficit present.     Mental Status: She is alert.  Psychiatric:        Mood and Affect: Mood normal.        Behavior: Behavior normal.     ------------------------------------------------------------------------------------------------------------------------------------------------------------------------------------------------------------------- Assessment and Plan  Hair loss Orders Placed This Encounter  Procedures   Tdap vaccine greater than or equal to 7yo IM   CMP14+EGFR   CBC with Differential/Platelet   Ferritin   TSH + free T4   B12 and Folate Panel   Ambulatory referral to Dermatology    Referral Priority:   Routine    Referral Type:   Consultation    Referral Reason:   Specialty Services Required    Requested Specialty:   Dermatology    Number of Visits Requested:   1     No orders of the defined types were placed in this encounter.   No follow-ups on file.

## 2023-10-20 LAB — CMP14+EGFR
ALT: 23 IU/L (ref 0–32)
AST: 27 IU/L (ref 0–40)
Albumin: 4.2 g/dL (ref 3.9–4.9)
Alkaline Phosphatase: 63 IU/L (ref 44–121)
BUN/Creatinine Ratio: 19 (ref 12–28)
BUN: 17 mg/dL (ref 8–27)
Bilirubin Total: 0.4 mg/dL (ref 0.0–1.2)
CO2: 24 mmol/L (ref 20–29)
Calcium: 9.3 mg/dL (ref 8.7–10.3)
Chloride: 106 mmol/L (ref 96–106)
Creatinine, Ser: 0.91 mg/dL (ref 0.57–1.00)
Globulin, Total: 2.3 g/dL (ref 1.5–4.5)
Glucose: 84 mg/dL (ref 70–99)
Potassium: 4.6 mmol/L (ref 3.5–5.2)
Sodium: 144 mmol/L (ref 134–144)
Total Protein: 6.5 g/dL (ref 6.0–8.5)
eGFR: 70 mL/min/1.73 (ref >59)

## 2023-10-20 LAB — CBC WITH DIFFERENTIAL/PLATELET
Basophils Absolute: 0.1 x10E3/uL (ref 0.0–0.2)
Basos: 1 %
EOS (ABSOLUTE): 0.2 x10E3/uL (ref 0.0–0.4)
Eos: 2 %
Hematocrit: 38.7 % (ref 34.0–46.6)
Hemoglobin: 12.3 g/dL (ref 11.1–15.9)
Immature Grans (Abs): 0 x10E3/uL (ref 0.0–0.1)
Immature Granulocytes: 0 %
Lymphocytes Absolute: 2.1 x10E3/uL (ref 0.7–3.1)
Lymphs: 25 %
MCH: 29.9 pg (ref 26.6–33.0)
MCHC: 31.8 g/dL (ref 31.5–35.7)
MCV: 94 fL (ref 79–97)
Monocytes Absolute: 0.7 x10E3/uL (ref 0.1–0.9)
Monocytes: 9 %
Neutrophils Absolute: 5.3 x10E3/uL (ref 1.4–7.0)
Neutrophils: 63 %
Platelets: 230 x10E3/uL (ref 150–450)
RBC: 4.12 x10E6/uL (ref 3.77–5.28)
RDW: 12.7 % (ref 11.7–15.4)
WBC: 8.3 x10E3/uL (ref 3.4–10.8)

## 2023-10-20 LAB — B12 AND FOLATE PANEL
Folate: 14.2 ng/mL (ref >3.0)
Vitamin B-12: >2000 pg/mL — ABNORMAL HIGH (ref 232–1245)

## 2023-10-20 LAB — FERRITIN: Ferritin: 114 ng/mL (ref 15–150)

## 2023-10-20 LAB — TSH+FREE T4
Free T4: 1.05 ng/dL (ref 0.82–1.77)
TSH: 1.56 u[IU]/mL (ref 0.450–4.500)

## 2023-10-24 ENCOUNTER — Encounter: Payer: Self-pay | Admitting: Family Medicine

## 2023-10-24 DIAGNOSIS — L659 Nonscarring hair loss, unspecified: Secondary | ICD-10-CM | POA: Insufficient documentation

## 2023-10-24 NOTE — Assessment & Plan Note (Signed)
 Orders Placed This Encounter  Procedures   Tdap vaccine greater than or equal to 64yo IM   CMP14+EGFR   CBC with Differential/Platelet   Ferritin   TSH + free T4   B12 and Folate Panel   Ambulatory referral to Dermatology    Referral Priority:   Routine    Referral Type:   Consultation    Referral Reason:   Specialty Services Required    Requested Specialty:   Dermatology    Number of Visits Requested:   1

## 2023-10-25 ENCOUNTER — Ambulatory Visit: Payer: Self-pay | Admitting: Family Medicine

## 2023-10-25 ENCOUNTER — Encounter: Payer: Self-pay | Admitting: Sports Medicine

## 2023-10-31 ENCOUNTER — Ambulatory Visit: Admitting: Behavioral Health

## 2023-11-05 ENCOUNTER — Other Ambulatory Visit: Payer: Self-pay | Admitting: Family Medicine

## 2023-11-16 ENCOUNTER — Other Ambulatory Visit: Payer: Self-pay | Admitting: Family Medicine

## 2023-11-16 DIAGNOSIS — Z1231 Encounter for screening mammogram for malignant neoplasm of breast: Secondary | ICD-10-CM

## 2023-12-14 ENCOUNTER — Other Ambulatory Visit (HOSPITAL_COMMUNITY)
Admission: RE | Admit: 2023-12-14 | Discharge: 2023-12-14 | Disposition: A | Discharge status: Home / Self Care | Payer: Self-pay | Source: Ambulatory Visit | Attending: Medical Genetics | Admitting: Medical Genetics

## 2023-12-23 LAB — GENECONNECT MOLECULAR SCREEN: Genetic Analysis Overall Interpretation: NEGATIVE

## 2023-12-27 ENCOUNTER — Ambulatory Visit: Admitting: Behavioral Health

## 2023-12-27 ENCOUNTER — Encounter: Payer: Self-pay | Admitting: Behavioral Health

## 2023-12-27 DIAGNOSIS — F4322 Adjustment disorder with anxiety: Secondary | ICD-10-CM | POA: Diagnosis not present

## 2023-12-27 NOTE — Progress Notes (Signed)
 [facility] Counselor/Therapist Progress Note  Patient ID: Stacy Preston, MRN: [id number],    Date: 12/27/2023  Time Spent: 8:00 am until 8:58 am, 58 minutes spent face-to-face with the patient in the outpatient therapist office.   Treatment Type: Individual Therapy  Reported Symptoms: Stress/anxiety  Mental Status Exam: Appearance:  Well Groomed     Behavior: Appropriate  Motor: Normal  Speech/Language:  Clear and Coherent  Affect: Appropriate  Mood: normal  Thought process: normal  Thought content:   WNL  Sensory/Perceptual disturbances:   WNL  Orientation: oriented to person, place, time/date, situation, day of week, month of year, and year  Attention: Good  Concentration: Good  Memory: WNL  Fund of knowledge:  Good  Insight:   Good  Judgment:  Good  Impulse Control: Good   Risk Assessment: Danger to Self:  No Self-injurious Behavior: No Danger to Others: No Duty to Warn:no Physical Aggression / Violence:No  Access to Firearms a concern: No  Gang Involvement:No  Subjective;  I had not seen the patient since August of this year and soon after that the patient said things started to change with her daughter.  The patient and her husband went back to [place]  where they are from for a weekend volunteer event and when they came back the daughter's best friend had convinced her to move back to [place] .  Was no conversation for the patient and her husband until a point from the daughter or the daughter's friend over the daughter's friend's mother whom they had known for years.  The patient has since to move to [place]  and is living in the basement of her daughter's mother it with the intention of moving into the apartment with her best friend in a few months.  The patient indicated they put a ton of work into helping the patient get established here including finding work, childcare, helping to buy a car and they feel like all that was just recorded not  appreciated.  Their daughter is 86 and they said this is gone on and off for 11 years and they are tired the roller coaster out of only being able to help the patient's daughter when she needs something financially.  She would not be making enough money in the [place]  area to support herself I realize there is nothing that they can do about this as it appears that the decision was significantly impulsive.  We talked at length about setting clear boundaries for themselves with the daughter and what that looks like in terms of how they were willing to help and support but knowing that in someway she has taken advantage of them and in trying to help I have enabled her choices and behaviors.  We talked about how she in a way that will be evolving over time we will continue to touch with her daughter and granddaughter.  The granddaughter who is now 66 months old is part of the difficulty because she is not sure how the daughter cannot afford to put her in daycare and work what provisions might be made.  This is a difficult situation in time especially with the patient.  She says her husband is handling much better because he had set boundaries with her earlier because he came more frustrated with her ups and downs choices over the years than the patient has.  I courage continued boundaries as well as the use of coping skills especially in this whole time.  Also encouraged him to start to  do the count of things that he wanted to do as they approach retirement. Patient does contract for safety having no thoughts of hurting herself or anyone else. Interventions: Cognitive Behavioral Therapy  Diagnosis: Generalized anxiety disorder  Plan: I .  Will meet with the patient every 2 weeks and she prefers that to be in person if possible.  Treatment plan: We will use cognitive behavioral therapy as well as elements of dialectical behavior therapy to reduce the patient's stress and anxiety level by at least 50% with a  target date of December 31st, 2025.  Goals will be to improve her ability to better manage anxiety symptoms and stress.  We will try to identify causes for anxiety and explore ways to reduce that as well as trying to resolve core conflicts contributing to the cause of anxiety.  We will use cognitive behavioral therapy to help her manage thoughts and worrisome thinking contributing to anxiety.  Interventions include providing education about anxiety to help her understand its causes symptoms and triggers, facilitate problem solution skills as well as teach coping skills to help her manage anxiety.  We will use cognitive behavioral therapy to identify and change anxiety provoking thoughts as well as dialectical behavior therapy to help her with her distress tolerance and mindfulness skills.  Lorrene CHRISTELLA Hasten, [facility]                         Lorrene CHRISTELLA Hasten, [facility]               Lorrene CHRISTELLA Hasten, [facility]

## 2023-12-29 ENCOUNTER — Ambulatory Visit: Admitting: Behavioral Health

## 2023-12-30 ENCOUNTER — Ambulatory Visit
Admission: RE | Admit: 2023-12-30 | Discharge: 2023-12-30 | Disposition: A | Discharge status: Home / Self Care | Source: Ambulatory Visit | Attending: Family Medicine | Admitting: Family Medicine

## 2023-12-30 ENCOUNTER — Ambulatory Visit: Admitting: Podiatry

## 2023-12-30 ENCOUNTER — Telehealth: Payer: Self-pay | Admitting: *Deleted

## 2023-12-30 ENCOUNTER — Encounter: Payer: Self-pay | Admitting: Podiatry

## 2023-12-30 DIAGNOSIS — M722 Plantar fascial fibromatosis: Secondary | ICD-10-CM

## 2023-12-30 DIAGNOSIS — Z1231 Encounter for screening mammogram for malignant neoplasm of breast: Secondary | ICD-10-CM

## 2023-12-30 NOTE — Patient Instructions (Signed)

## 2023-12-30 NOTE — Progress Notes (Signed)
  Subjective:  Patient ID: Stacy Preston, female    DOB: 04-24-1960,   MRN: [id number]  Chief Complaint  Patient presents with   Post-op Problem    DOS 05/31/2023  LEFT PLANTAR FASCIOTOMY She did surgery in April, over the past month, I've been having pain in that area.    63 y.o. female presents for concern of reoccurrence of left plantar heel pain.  She had left plantar fasciotomy back in April 2025.  She relates over the past month she has been having pain in the area.  Denies any changes in lifestyle.  She relates it feels like there is something in the arch of her foot.  She relates a squishy sensation.  She denies much pain but feels like there is something there.. Denies any other pedal complaints. Denies n/v/f/c.   Past Medical History:  Diagnosis Date   Allergy    History of basal cell carcinoma 01/09/2016   Hypertension    Infertility, female    Post-operative nausea and vomiting    Prolactinoma (HCC)    Vitamin D  deficiency 01/09/2016    Objective:  Physical Exam: Vascular: DP/PT pulses 2/4 bilateral. CFT <3 seconds. Normal hair growth on digits. No edema.  Skin. No lacerations or abrasions bilateral feet.  Musculoskeletal: MMT 5/5 bilateral lower extremities in DF, PF, Inversion and Eversion. Deceased ROM in DF of ankle joint.  Tenderness noted to the arch of left foot.  Mild palpable soft tissue mass noted firm and mobile with the plantar fascia. Neurological: Sensation intact to light touch.   Assessment:   1. Plantar fascial fibromatosis   2. Plantar fasciitis, left      Plan:  Patient was evaluated and treated and all questions answered. Discussed the diagnosis of fibroma and treatment options with patient. Discussed offloading of the area and proper shoe wear. Discussed trying verapamil treatments. Stretching exercises given. MRI ordered to further evaluate for possible plantar fibroma versus other nerve related injury or other causes given soft  tissue mass. Did discuss surgical options and advised patient that fibroma resection is not 100% guaranteed. Patient to follow-up in 2 months for recheck   Stacy Failing, DPM

## 2023-12-30 NOTE — Telephone Encounter (Signed)
 I called the patient to see what was going on with her foot to determine whether or not if she needed to stop and get xrays of her foot.  She said it was the same problem that Dr. Sikora ddi surgery for.

## 2024-01-02 ENCOUNTER — Encounter: Payer: Self-pay | Admitting: Family Medicine

## 2024-01-02 ENCOUNTER — Other Ambulatory Visit: Payer: Self-pay

## 2024-01-02 ENCOUNTER — Ambulatory Visit (INDEPENDENT_AMBULATORY_CARE_PROVIDER_SITE_OTHER)

## 2024-01-02 ENCOUNTER — Ambulatory Visit: Admitting: Family Medicine

## 2024-01-02 VITALS — BP 138/82 | HR 76 | Ht 63.0 in | Wt 145.0 lb

## 2024-01-02 DIAGNOSIS — G8929 Other chronic pain: Secondary | ICD-10-CM

## 2024-01-02 DIAGNOSIS — M5416 Radiculopathy, lumbar region: Secondary | ICD-10-CM

## 2024-01-02 DIAGNOSIS — M25561 Pain in right knee: Secondary | ICD-10-CM | POA: Diagnosis not present

## 2024-01-02 MED ORDER — GABAPENTIN 100 MG PO CAPS: 100.0000 mg | ORAL_CAPSULE | Freq: Three times a day (TID) | ORAL | 3 refills | Status: AC | PRN

## 2024-01-02 MED ORDER — PREDNISONE 50 MG PO TABS: 50.0000 mg | ORAL_TABLET | Freq: Every day | ORAL | 0 refills | Status: DC

## 2024-01-02 NOTE — Patient Instructions (Addendum)
 Thank you for coming in today.   You received an injection today. Seek immediate medical attention if the joint becomes red, extremely painful, or is oozing fluid.   Please get an Xray today before you leave   You should hear from MRI scheduling within 1 week. If you do not hear please let me know.    Try the prednisone  and gabapentin .   If not better let me know and we can do the injection without the MRI if it is going to be delayed.

## 2024-01-02 NOTE — Progress Notes (Signed)
 I, Leotis Batter, CMA acting as a scribe for Artist Lloyd, MD.  Stacy Preston is a 63 y.o. female who presents to [facility] today for right leg numbness. Pt was last seen by Dr. Lloyd on 11/11/23 for right knee pain. Pt received steroid injection. Also c/o rib pain. XR was obtained, no fracture was seen.   Today, pt reports right leg numbness x 1.5-2 months. Shooting pain across the bilat lower back radiating into the right gluteal region and down the leg to the knee.  N/T worse with prolonged ambulation. Has tried Meloxicam  or IBU with minimal relief.  She does note a bit of weakness in the right lower extremity.  Additionally she notes continued right knee pain.  She has been seen for this problem in the past before and pain was thought to be due to DJD.  Last steroid injection was in June.  Pain has returned a bit recently.  She is interested in repeat injection if possible.   Pertinent review of systems: No fevers or chills  Relevant historical information: Lumbar radiculopathy history.   Exam:  BP 138/82   Pulse 76   Ht 5' 3 (1.6 m)   Wt 145 lb (65.8 kg)   SpO2 100%   BMI 25.69 kg/m  General: Well Developed, well nourished, and in no acute distress.   MSK: L-spine: Normal appearing. Nontender palpation spinal midline. Decreased lumbar motion.  Lower extremity strength mild decreased right knee extension and right foot dorsiflexion.  Right knee mild effusion.  Normal motion.  Stable ligamentous exam.   Lab and Radiology Results  Procedure: Real-time Ultrasound Guided Injection of right knee joint superior lateral patella space Device: Philips Affiniti 50G/GE Logiq Images permanently stored and available for review in PACS Verbal informed consent obtained.  Discussed risks and benefits of procedure. Warned about infection, bleeding, hyperglycemia damage to structures among others. Patient expresses understanding and  agreement Time-out conducted.   Noted no overlying erythema, induration, or other signs of local infection.   Skin prepped in a sterile fashion.   Local anesthesia: Topical Ethyl chloride.   With sterile technique and under real time ultrasound guidance: 40 mg of Kenalog  and 2 mL of Marcaine  injected into knee joint. Fluid seen entering the joint capsule.   Completed without difficulty   Pain immediately resolved suggesting accurate placement of the medication.   Advised to call if fevers/chills, erythema, induration, drainage, or persistent bleeding.   Images permanently stored and available for review in the ultrasound unit.  Impression: Technically successful ultrasound guided injection.    X-ray images lumbar spine obtained today personally and independently interpreted. DDD L4-5 and L5-S1.  No acute fractures are visible. Await formal radiology review   Assessment and Plan: 63 y.o. female with right lumbar radiculopathy most consistent with L5 dermatomal pattern.  She does have a bit of weakness that does correspond to this level as well.  Plan for course of prednisone  and gabapentin  but will proceed to MRI.  If we cannot get the MRI authorized in a timely manner we may need to do an epidural steroid injection if her symptoms are not well-controlled with the oral prednisone .  Gabapentin  was also prescribed as well.  Additionally patient notes recurrence of her chronic right knee pain due to DJD.  Plan for steroid injection as well today.   PDMP not reviewed this encounter. Orders Placed This Encounter  Procedures   DG Lumbar Spine 2-3 Views  Standing Status:   Future    Number of Occurrences:   1    Expiration Date:   01/01/2025    Reason for Exam (SYMPTOM  OR DIAGNOSIS REQUIRED):   eval rt lumbar radiculopathy    Preferred imaging location?:   [facility]   MR Lumbar Spine Wo Contrast    Standing Status:   Future    Expiration Date:   01/01/2025    What is the  patient's sedation requirement?:   No Sedation    Does the patient have a pacemaker or implanted devices?:   No    Preferred imaging location?:   [facility] (table limit-500lbs)   US  LIMITED JOINT SPACE STRUCTURES LOW RIGHT(NO LINKED CHARGES)    Reason for Exam (SYMPTOM  OR DIAGNOSIS REQUIRED):   right knee pain    Preferred imaging location?:   [facility]   Meds ordered this encounter  Medications   predniSONE  (DELTASONE ) 50 MG tablet    Sig: Take 1 tablet (50 mg total) by mouth daily.    Dispense:  5 tablet    Refill:  0   gabapentin  (NEURONTIN ) 100 MG capsule    Sig: Take 1-3 capsules (100-300 mg total) by mouth 3 (three) times daily as needed.    Dispense:  90 capsule    Refill:  3     Discussed warning signs or symptoms. Please see discharge instructions. Patient expresses understanding.   The above documentation has been reviewed and is accurate and complete Artist Lloyd, M.D.

## 2024-01-03 ENCOUNTER — Other Ambulatory Visit: Payer: Self-pay | Admitting: Podiatry

## 2024-01-03 ENCOUNTER — Telehealth: Payer: Self-pay | Admitting: Podiatry

## 2024-01-03 DIAGNOSIS — M722 Plantar fascial fibromatosis: Secondary | ICD-10-CM

## 2024-01-03 NOTE — Telephone Encounter (Signed)
 Pt lft mess on my vmail and I sent mess to Dr. Joya and [facility] that she wants MRI done in [place] if she can.

## 2024-01-04 ENCOUNTER — Ambulatory Visit: Payer: Self-pay | Admitting: Family Medicine

## 2024-01-04 NOTE — Progress Notes (Signed)
 Low back x-ray shows old compression fracture at T12 that is not changed from prior x-rays and a little bit of scoliosis at L3-L4.

## 2024-01-06 NOTE — Addendum Note (Signed)
 Addended by: GEROME ASHLEY SAUNDERS on: 01/06/2024 08:14 AM   Modules accepted: Orders

## 2024-01-09 ENCOUNTER — Telehealth: Payer: Self-pay | Admitting: Podiatry

## 2024-01-09 ENCOUNTER — Other Ambulatory Visit: Payer: Self-pay | Admitting: Podiatry

## 2024-01-09 DIAGNOSIS — M722 Plantar fascial fibromatosis: Secondary | ICD-10-CM

## 2024-01-09 NOTE — Telephone Encounter (Signed)
 Patient called and said that she would like the order for imaging to be sent to [facility] in [place].

## 2024-01-09 NOTE — Telephone Encounter (Signed)
 Stacy Preston from [facility] called in regards to the referral [facility]. She said she left a voicemail on the triage line however the patient has decided that she would like to keep original referral. I called the patient to verify if that was correct and she said that she has an appointment scheduled for Monday at the original location.

## 2024-01-10 ENCOUNTER — Encounter: Payer: Self-pay | Admitting: Behavioral Health

## 2024-01-10 ENCOUNTER — Ambulatory Visit: Admitting: Behavioral Health

## 2024-01-10 DIAGNOSIS — F411 Generalized anxiety disorder: Secondary | ICD-10-CM | POA: Diagnosis not present

## 2024-01-10 NOTE — Progress Notes (Signed)
 [facility] Counselor/Therapist Progress Note  Patient ID: Stacy Preston, MRN: [id number],    Date: 01/10/2024  Time Spent: 8:00 am until 8:58 am, 58 minutes spent face-to-face with the patient in the outpatient therapist office.   Treatment Type: Individual Therapy  Reported Symptoms: Stress/anxiety  Mental Status Exam: Appearance:  Well Groomed     Behavior: Appropriate  Motor: Normal  Speech/Language:  Clear and Coherent  Affect: Appropriate  Mood: normal  Thought process: normal  Thought content:   WNL  Sensory/Perceptual disturbances:   WNL  Orientation: oriented to person, place, time/date, situation, day of week, month of year, and year  Attention: Good  Concentration: Good  Memory: WNL  Fund of knowledge:  Good  Insight:   Good  Judgment:  Good  Impulse Control: Good   Risk Assessment: Danger to Self:  No Self-injurious Behavior: No Danger to Others: No Duty to Warn:no Physical Aggression / Violence:No  Access to Firearms a concern: No  Gang Involvement:No  Subjective; the patient is trying to set healthy verbal emotional mental boundaries in relationship to her daughter.  Her daughter is coming home for Thanksgiving and although she wants to see the daughter and the granddaughter she knows it will be hard because it feels like it is opening up the wound again.  She read a book by Inocente Fent entitled this is not the end of you which talks about grieving and separation.  The patient feels as if her daughter abandoning her and this is not the first time her daughter has done that.  She recognizes that her daughter is diagnosed with borderline personality disorder and that is a part of the diagnosis but this time hurts more than any other because they did so much to help her through the pregnancy and with her granddaughter.  Talked about how she control those boundaries including being willing to give her advice but starting to like over the fact  that she knows her daughter may not take it or act on it.  She said she and her husband talked about it and they just cannot let her come back home again but she is going through the progression of what if this.  Talked about how to challenge a lot of's and how to challenge those thoughts about doing something good for herself.  Use the analogy of continuing to mend a piece of clothing over and over again but they are finally being a point where you have to buy a new piece of clothing.  She notes that her daughter has now learned how to do this herself and so we talked about the constant cognitive challenges that will make her stronger through the grieving process.  Patient does contract for safety having no thoughts of hurting herself or anyone else. Interventions: Cognitive Behavioral Therapy  Diagnosis: Generalized anxiety disorder  Plan: I .  Will meet with the patient every 2 weeks and she prefers that to be in person if possible.  Treatment plan: We will use cognitive behavioral therapy as well as elements of dialectical behavior therapy to reduce the patient's stress and anxiety level by at least 50% with a target date of December 31st, 2025.  Goals will be to improve her ability to better manage anxiety symptoms and stress.  We will try to identify causes for anxiety and explore ways to reduce that as well as trying to resolve core conflicts contributing to the cause of anxiety.  We will use cognitive behavioral therapy  to help her manage thoughts and worrisome thinking contributing to anxiety.  Interventions include providing education about anxiety to help her understand its causes symptoms and triggers, facilitate problem solution skills as well as teach coping skills to help her manage anxiety.  We will use cognitive behavioral therapy to identify and change anxiety provoking thoughts as well as dialectical behavior therapy to help her with her distress tolerance and mindfulness  skills.  Lorrene CHRISTELLA Hasten, [facility]                         Lorrene CHRISTELLA Hasten, [facility]               Lorrene CHRISTELLA Hasten, [facility]               Lorrene CHRISTELLA Hasten, [facility]

## 2024-01-16 ENCOUNTER — Ambulatory Visit (INDEPENDENT_AMBULATORY_CARE_PROVIDER_SITE_OTHER)

## 2024-01-16 ENCOUNTER — Encounter: Payer: Self-pay | Admitting: Family Medicine

## 2024-01-16 DIAGNOSIS — M722 Plantar fascial fibromatosis: Secondary | ICD-10-CM

## 2024-01-18 ENCOUNTER — Encounter: Payer: Self-pay | Admitting: Family Medicine

## 2024-01-18 ENCOUNTER — Ambulatory Visit (INDEPENDENT_AMBULATORY_CARE_PROVIDER_SITE_OTHER): Admitting: Family Medicine

## 2024-01-18 VITALS — BP 123/66 | HR 90 | Ht 63.0 in | Wt 149.0 lb

## 2024-01-18 DIAGNOSIS — R7989 Other specified abnormal findings of blood chemistry: Secondary | ICD-10-CM

## 2024-01-18 DIAGNOSIS — Z0000 Encounter for general adult medical examination without abnormal findings: Secondary | ICD-10-CM | POA: Diagnosis not present

## 2024-01-18 DIAGNOSIS — E559 Vitamin D deficiency, unspecified: Secondary | ICD-10-CM | POA: Diagnosis not present

## 2024-01-18 DIAGNOSIS — Z13220 Encounter for screening for lipoid disorders: Secondary | ICD-10-CM

## 2024-01-18 NOTE — Progress Notes (Signed)
 Stacy Preston - 63 y.o. female MRN [id number]  Date of birth: Oct 04, 1960  Subjective Chief Complaint  Patient presents with   Annual Exam    HPI Stacy Preston is a 63 y.o. female here today for annual exam.   She has seen Dr. Joane for low back pain and MRI has been ordered.   Doing well with all other medicaitons.   She tries to remain pretty active with weights.  Not as much cardio recently.  She feels that she continues to do well with dietary changes.   She is a non-smoker.  Occasional EtOH.   Seeing dermatology regularly.   She has regular dental care.   Review of Systems  Constitutional:  Negative for chills, fever, malaise/fatigue and weight loss.  HENT:  Negative for congestion, ear pain and sore throat.   Eyes:  Negative for blurred vision, double vision and pain.  Respiratory:  Negative for cough and shortness of breath.   Cardiovascular:  Negative for chest pain and palpitations.  Gastrointestinal:  Negative for abdominal pain, blood in stool, constipation, heartburn and nausea.  Genitourinary:  Negative for dysuria and urgency.  Musculoskeletal:  Negative for joint pain and myalgias.  Neurological:  Negative for dizziness and headaches.  Endo/Heme/Allergies:  Does not bruise/bleed easily.  Psychiatric/Behavioral:  Negative for depression. The patient is not nervous/anxious and does not have insomnia.     Allergies  Allergen Reactions   Lactose Intolerance (Gi)     Past Medical History:  Diagnosis Date   Allergy    History of basal cell carcinoma 01/09/2016   Hypertension    Infertility, female    Post-operative nausea and vomiting    Prolactinoma (HCC)    Vitamin D  deficiency 01/09/2016    Past Surgical History:  Procedure Laterality Date   BREAST BIOPSY Right 12/29/2020   COLONOSCOPY  2013, 2017   [place]   INSERTION OF MESH N/A 07/21/2022   Procedure: INSERTION OF MESH;  Surgeon: Curvin Deward MOULD, MD;  Location: [facility];  Service: General;  Laterality: N/A;   ivf     LAPAROSCOPY     for endometriosis   PLANTAR FASCIA RELEASE  05/2023   PLANTAR FASCIA SURGERY     UMBILICAL HERNIA REPAIR N/A 07/21/2022   Procedure: HERNIA REPAIR UMBILICAL ADULT WITH POSSIBLE MESH;  Surgeon: Curvin Deward MOULD, MD;  Location: [facility];  Service: General;  Laterality: N/A;    Social History   Socioeconomic History   Marital status: Married    Spouse name: Not on file   Number of children: Not on file   Years of education: Not on file   Highest education level: Not on file  Occupational History   Not on file  Tobacco Use   Smoking status: Never   Smokeless tobacco: Never  Vaping Use   Vaping status: Never Used  Substance and Sexual Activity   Alcohol use: Yes    Alcohol/week: 2.0 standard drinks of alcohol    Types: 2 Standard drinks or equivalent per week    Comment: socially   Drug use: No   Sexual activity: Yes    Birth control/protection: None  Other Topics Concern   Not on file  Social History Narrative   Not on file   Social Drivers of Health   Financial Resource Strain: Low Risk  (01/18/2024)   Overall Financial Resource Strain (CARDIA)    Difficulty of Paying Living Expenses: Not hard at all  Food Insecurity: No Food Insecurity (01/18/2024)   Hunger Vital Sign    Worried About Running Out of Food in the Last Year: Never true    Ran Out of Food in the Last Year: Never true  Transportation Needs: No Transportation Needs (01/18/2024)   PRAPARE - Administrator, Civil Service (Medical): No    Lack of Transportation (Non-Medical): No  Physical Activity: Insufficiently Active (01/18/2024)   Exercise Vital Sign    Days of Exercise per Week: 1 day    Minutes of Exercise per Session: 40 min  Stress: No Stress Concern Present (01/18/2024)   [employer] of Occupational Health - Occupational Stress Questionnaire    Feeling of Stress: Not at all  Social  Connections: Socially Integrated (01/18/2024)   Social Connection and Isolation Panel    Frequency of Communication with Friends and Family: Three times a week    Frequency of Social Gatherings with Friends and Family: Once a week    Attends Religious Services: 1 to 4 times per year    Active Member of Clubs or Organizations: Yes    Attends Banker Meetings: 1 to 4 times per year    Marital Status: Married    Family History  Problem Relation Age of Onset   Renal Disease Mother    Diabetes Mother    Hypertension Mother    Cancer Father        Lung CA   Diabetes Maternal Aunt    Heart disease Maternal Grandmother    Diabetes Maternal Grandmother    Cancer Maternal Grandfather    Diabetes Maternal Grandfather    Colon cancer Maternal Grandfather 64 - 79   Stroke Paternal Grandmother    Heart disease Paternal Grandfather    Alcohol abuse Brother    Hypertension Brother    Sickle cell anemia Brother    Cancer Brother    Colon polyps Brother    Esophageal cancer Neg Hx    Rectal cancer Neg Hx    Stomach cancer Neg Hx    Breast cancer Neg Hx     Health Maintenance  Topic Date Due   Zoster Vaccines- Shingrix (1 of 2) 04/19/2024 (Originally 09/07/1978)   Influenza Vaccine  05/22/2024 (Originally 09/23/2023)   Pneumococcal Vaccine: 50+ Years (1 of 1 - PCV) 01/17/2025 (Originally 09/06/2009)   COVID-19 Vaccine (4 - 2025-26 season) 02/02/2025 (Originally 10/24/2023)   Colonoscopy  08/05/2025   Mammogram  12/29/2025   Cervical Cancer Screening (HPV/Pap Cotest)  01/24/2028   DTaP/Tdap/Td (3 - Td or Tdap) 10/18/2033   Hepatitis C Screening  Completed   HIV Screening  Completed   Hepatitis B Vaccines 19-59 Average Risk  Aged Out   HPV VACCINES  Aged Out   Meningococcal B Vaccine  Aged Out      ----------------------------------------------------------------------------------------------------------------------------------------------------------------------------------------------------------------- Physical Exam BP 123/66 (BP Location: Left Arm, Patient Position: Sitting, Cuff Size: Normal)   Pulse 90   Ht 5' 3 (1.6 m)   Wt 149 lb (67.6 kg)   SpO2 100%   BMI 26.39 kg/m   Physical Exam Constitutional:      General: She is not in acute distress. HENT:     Head: Normocephalic and atraumatic.     Right Ear: Tympanic membrane and ear canal normal.     Left Ear: Tympanic membrane and ear canal normal.     Nose: Nose normal.  Eyes:     General: No scleral icterus.    Conjunctiva/sclera: Conjunctivae normal.  Neck:     Thyroid : No thyromegaly.  Cardiovascular:     Rate and Rhythm: Normal rate and regular rhythm.     Heart sounds: Normal heart sounds.  Pulmonary:     Effort: Pulmonary effort is normal.     Breath sounds: Normal breath sounds.  Abdominal:     General: Bowel sounds are normal. There is no distension.     Palpations: Abdomen is soft.     Tenderness: There is no abdominal tenderness. There is no guarding.  Musculoskeletal:        General: Normal range of motion.     Cervical back: Normal range of motion and neck supple.  Lymphadenopathy:     Cervical: No cervical adenopathy.  Skin:    General: Skin is warm and dry.     Findings: No rash.  Neurological:     General: No focal deficit present.     Mental Status: She is alert and oriented to person, place, and time.     Cranial Nerves: No cranial nerve deficit.     Coordination: Coordination normal.  Psychiatric:        Mood and Affect: Mood normal.        Behavior: Behavior normal.      ------------------------------------------------------------------------------------------------------------------------------------------------------------------------------------------------------------------- Assessment and Plan  Well adult exam Well adult Orders Placed This Encounter  Procedures   CMP14+EGFR   CBC with Differential/Platelet   Lipid Panel With LDL/HDL Ratio   HgB A1c   TSH + free T4   Prolactin   Vitamin D  (25 hydroxy)  Screenings: UTD Immunizations: UTD Anticipatory guidance/Risk factor reduction:  Recommendations per AVS.     No orders of the defined types were placed in this encounter.   No follow-ups on file.

## 2024-01-18 NOTE — Patient Instructions (Signed)
 Preventive Care 58-64 Years Old, Female  Preventive care refers to lifestyle choices and visits with your health care provider that can promote health and wellness. Preventive care visits are also called wellness exams.  What can I expect for my preventive care visit?  Counseling  Your health care provider may ask you questions about your:  Medical history, including:  Past medical problems.  Family medical history.  Pregnancy history.  Current health, including:  Menstrual cycle.  Method of birth control.  Emotional well-being.  Home life and relationship well-being.  Sexual activity and sexual health.  Lifestyle, including:  Alcohol, nicotine or tobacco, and drug use.  Access to firearms.  Diet, exercise, and sleep habits.  Work and work Astronomer.  Sunscreen use.  Safety issues such as seatbelt and bike helmet use.  Physical exam  Your health care provider will check your:  Height and weight. These may be used to calculate your BMI (body mass index). BMI is a measurement that tells if you are at a healthy weight.  Waist circumference. This measures the distance around your waistline. This measurement also tells if you are at a healthy weight and may help predict your risk of certain diseases, such as type 2 diabetes and high blood pressure.  Heart rate and blood pressure.  Body temperature.  Skin for abnormal spots.  What immunizations do I need?    Vaccines are usually given at various ages, according to a schedule. Your health care provider will recommend vaccines for you based on your age, medical history, and lifestyle or other factors, such as travel or where you work.  What tests do I need?  Screening  Your health care provider may recommend screening tests for certain conditions. This may include:  Lipid and cholesterol levels.  Diabetes screening. This is done by checking your blood sugar (glucose) after you have not eaten for a while (fasting).  Pelvic exam and Pap test.  Hepatitis B test.  Hepatitis C  test.  HIV (human immunodeficiency virus) test.  STI (sexually transmitted infection) testing, if you are at risk.  Lung cancer screening.  Colorectal cancer screening.  Mammogram. Talk with your health care provider about when you should start having regular mammograms. This may depend on whether you have a family history of breast cancer.  BRCA-related cancer screening. This may be done if you have a family history of breast, ovarian, tubal, or peritoneal cancers.  Bone density scan. This is done to screen for osteoporosis.  Talk with your health care provider about your test results, treatment options, and if necessary, the need for more tests.  Follow these instructions at home:  Eating and drinking    Eat a diet that includes fresh fruits and vegetables, whole grains, lean protein, and low-fat dairy products.  Take vitamin and mineral supplements as recommended by your health care provider.  Do not drink alcohol if:  Your health care provider tells you not to drink.  You are pregnant, may be pregnant, or are planning to become pregnant.  If you drink alcohol:  Limit how much you have to 0-1 drink a day.  Know how much alcohol is in your drink. In the [place] one drink equals one 12 oz bottle of beer (355 mL), one 5 oz glass of wine (148 mL), or one 1 oz glass of hard liquor (44 mL).  Lifestyle  Brush your teeth every morning and night with fluoride toothpaste. Floss one time each day.  Exercise for at least  30 minutes 5 or more days each week.  Do not use any products that contain nicotine or tobacco. These products include cigarettes, chewing tobacco, and vaping devices, such as e-cigarettes. If you need help quitting, ask your health care provider.  Do not use drugs.  If you are sexually active, practice safe sex. Use a condom or other form of protection to prevent STIs.  If you do not wish to become pregnant, use a form of birth control. If you plan to become pregnant, see your health care provider for a  prepregnancy visit.  Take aspirin only as told by your health care provider. Make sure that you understand how much to take and what form to take. Work with your health care provider to find out whether it is safe and beneficial for you to take aspirin daily.  Find healthy ways to manage stress, such as:  Meditation, yoga, or listening to music.  Journaling.  Talking to a trusted person.  Spending time with friends and family.  Minimize exposure to UV radiation to reduce your risk of skin cancer.  Safety  Always wear your seat belt while driving or riding in a vehicle.  Do not drive:  If you have been drinking alcohol. Do not ride with someone who has been drinking.  When you are tired or distracted.  While texting.  If you have been using any mind-altering substances or drugs.  Wear a helmet and other protective equipment during sports activities.  If you have firearms in your house, make sure you follow all gun safety procedures.  Seek help if you have been physically or sexually abused.  What's next?  Visit your health care provider once a year for an annual wellness visit.  Ask your health care provider how often you should have your eyes and teeth checked.  Stay up to date on all vaccines.  This information is not intended to replace advice given to you by your health care provider. Make sure you discuss any questions you have with your health care provider.  Document Revised: 08/06/2020 Document Reviewed: 08/06/2020  Elsevier Patient Education  2024 [employer].

## 2024-01-18 NOTE — Assessment & Plan Note (Signed)
 Well adult Orders Placed This Encounter  Procedures   CMP14+EGFR   CBC with Differential/Platelet   Lipid Panel With LDL/HDL Ratio   HgB A1c   TSH + free T4   Prolactin   Vitamin D  (25 hydroxy)  Screenings: UTD Immunizations: UTD Anticipatory guidance/Risk factor reduction:  Recommendations per AVS.

## 2024-01-24 ENCOUNTER — Telehealth: Payer: Self-pay | Admitting: Lab

## 2024-01-24 LAB — CMP14+EGFR
ALT: 28 IU/L (ref 0–32)
AST: 32 IU/L (ref 0–40)
Albumin: 4.1 g/dL (ref 3.9–4.9)
Alkaline Phosphatase: 61 IU/L (ref 49–135)
BUN/Creatinine Ratio: 24 (ref 12–28)
BUN: 19 mg/dL (ref 8–27)
Bilirubin Total: 0.4 mg/dL (ref 0.0–1.2)
CO2: 23 mmol/L (ref 20–29)
Calcium: 9.1 mg/dL (ref 8.7–10.3)
Chloride: 107 mmol/L — ABNORMAL HIGH (ref 96–106)
Creatinine, Ser: 0.79 mg/dL (ref 0.57–1.00)
Globulin, Total: 2.3 g/dL (ref 1.5–4.5)
Glucose: 74 mg/dL (ref 70–99)
Potassium: 4.5 mmol/L (ref 3.5–5.2)
Sodium: 142 mmol/L (ref 134–144)
Total Protein: 6.4 g/dL (ref 6.0–8.5)
eGFR: 83 mL/min/1.73 (ref >59)

## 2024-01-24 LAB — LIPID PANEL WITH LDL/HDL RATIO
Cholesterol, Total: 171 mg/dL (ref 100–199)
HDL: 85 mg/dL (ref >39)
LDL Chol Calc (NIH): 77 mg/dL (ref 0–99)
LDL/HDL Ratio: 0.9 ratio (ref 0.0–3.2)
Triglycerides: 43 mg/dL (ref 0–149)
VLDL Cholesterol Cal: 9 mg/dL (ref 5–40)

## 2024-01-24 LAB — CBC WITH DIFFERENTIAL/PLATELET
Basophils Absolute: 0.1 x10E3/uL (ref 0.0–0.2)
Basos: 1 %
EOS (ABSOLUTE): 0.3 x10E3/uL (ref 0.0–0.4)
Eos: 5 %
Hematocrit: 39.8 % (ref 34.0–46.6)
Hemoglobin: 12.4 g/dL (ref 11.1–15.9)
Immature Grans (Abs): 0 x10E3/uL (ref 0.0–0.1)
Immature Granulocytes: 0 %
Lymphocytes Absolute: 1.5 x10E3/uL (ref 0.7–3.1)
Lymphs: 27 %
MCH: 29.6 pg (ref 26.6–33.0)
MCHC: 31.2 g/dL — ABNORMAL LOW (ref 31.5–35.7)
MCV: 95 fL (ref 79–97)
Monocytes Absolute: 0.5 x10E3/uL (ref 0.1–0.9)
Monocytes: 9 %
Neutrophils Absolute: 3.3 x10E3/uL (ref 1.4–7.0)
Neutrophils: 58 %
Platelets: 221 x10E3/uL (ref 150–450)
RBC: 4.19 x10E6/uL (ref 3.77–5.28)
RDW: 12.7 % (ref 11.7–15.4)
WBC: 5.6 x10E3/uL (ref 3.4–10.8)

## 2024-01-24 LAB — VITAMIN D 25 HYDROXY (VIT D DEFICIENCY, FRACTURES): Vit D, 25-Hydroxy: 67.2 ng/mL (ref 30.0–100.0)

## 2024-01-24 LAB — PROLACTIN: Prolactin: 14.5 ng/mL (ref 3.6–25.2)

## 2024-01-24 LAB — TSH+FREE T4
Free T4: 1.08 ng/dL (ref 0.82–1.77)
TSH: 1.76 u[IU]/mL (ref 0.450–4.500)

## 2024-01-24 LAB — HEMOGLOBIN A1C
Est. average glucose Bld gHb Est-mCnc: 105 mg/dL
Hgb A1c MFr Bld: 5.3 % (ref 4.8–5.6)

## 2024-01-24 NOTE — Telephone Encounter (Signed)
 Patient calling regarding MRI results she states worsening pain in foot and is [place] and concerned about the walking with the pain scale she is on please advise.

## 2024-01-25 ENCOUNTER — Encounter: Payer: Self-pay | Admitting: Family Medicine

## 2024-01-26 ENCOUNTER — Encounter: Payer: Self-pay | Admitting: Podiatry

## 2024-01-26 ENCOUNTER — Ambulatory Visit: Admitting: Podiatry

## 2024-01-26 DIAGNOSIS — M722 Plantar fascial fibromatosis: Secondary | ICD-10-CM | POA: Diagnosis not present

## 2024-01-26 MED ORDER — DEXAMETHASONE SODIUM PHOSPHATE 120 MG/30ML IJ SOLN
4.0000 mg | Freq: Once | INTRAMUSCULAR | Status: AC
  Administered 2024-01-26: 4 mg via INTRA_ARTICULAR

## 2024-01-26 MED ORDER — TRIAMCINOLONE ACETONIDE 10 MG/ML IJ SUSP
2.5000 mg | Freq: Once | INTRAMUSCULAR | Status: AC
  Administered 2024-01-26: 2.5 mg via INTRA_ARTICULAR

## 2024-01-26 NOTE — Progress Notes (Signed)
  Subjective:  Patient ID: Stacy Preston, female    DOB: 15-Jan-1961,   MRN: [id number]  Chief Complaint  Patient presents with   Foot Pain    It's sore.    63 y.o. female presents for follow-up of left heel pain and to discuss MRI results.  She relates it is still sore.  She has been using the parabola and has not helped much.  She has been wearing good supportive shoes.  Denies any other pedal complaints. Denies n/v/f/c.   Past Medical History:  Diagnosis Date   Allergy    History of basal cell carcinoma 01/09/2016   Hypertension    Infertility, female    Post-operative nausea and vomiting    Prolactinoma (HCC)    Vitamin D  deficiency 01/09/2016    Objective:  Physical Exam: Vascular: DP/PT pulses 2/4 bilateral. CFT <3 seconds. Normal hair growth on digits. No edema.  Skin. No lacerations or abrasions bilateral feet.  Musculoskeletal: MMT 5/5 bilateral lower extremities in DF, PF, Inversion and Eversion. Deceased ROM in DF of ankle joint.  Tenderness noted to the arch of left foot.  No major palpable mass noted today.  There is tenderness noted in the central cord of the plantar fascia. Neurological: Sensation intact to light touch.   IMPRESSION: 1. Moderate plantar fasciitis of the central cord of the plantar fascia at towards the calcaneal insertion without a tear. 2. Moderate tendinosis of the peroneus brevis with a longitudinal split tear distal to the lateral malleolus.  Assessment:   1. Plantar fasciitis, left       Plan:  Patient was evaluated and treated and all questions answered. Discussed the diagnosis of plantar fasciitis and fibroma and treatment options with patient. Discussed offloading of the area and proper shoe wear. May discontinue verapamil as this is not helping. Stretching exercises given.  Will plan to start physical therapy Discussed MRI and reviewed results. Injection offered today procedure below. Discussed if continued problems can  consider surgical reoperation if needed. Patient to follow-up in 2 months for recheck  Procedure: Injection Tendon/Ligament Discussed alternatives, risks, complications and verbal consent was obtained.  Location: Left plantar fascia. Skin Prep: Alcohol. Injectate: 1cc 0.5% marcaine  plain, 1 cc dexamethasone  0.5 cc kenalog   Disposition: Patient tolerated procedure well. Injection site dressed with a band-aid.  Post-injection care was discussed and return precautions discussed.     Asberry Failing, DPM

## 2024-01-30 NOTE — Telephone Encounter (Signed)
 Hey, can we f/u on MRI L-spine results, per pt, was done 3 weeks ago, to me, looks like it was ordered but cannot tell that it was done.   Thanks!

## 2024-02-01 ENCOUNTER — Other Ambulatory Visit: Payer: Self-pay | Admitting: Family Medicine

## 2024-02-06 ENCOUNTER — Encounter: Payer: Self-pay | Admitting: Behavioral Health

## 2024-02-06 ENCOUNTER — Ambulatory Visit (INDEPENDENT_AMBULATORY_CARE_PROVIDER_SITE_OTHER): Admitting: Behavioral Health

## 2024-02-06 DIAGNOSIS — F411 Generalized anxiety disorder: Secondary | ICD-10-CM

## 2024-02-06 DIAGNOSIS — F4322 Adjustment disorder with anxiety: Secondary | ICD-10-CM

## 2024-02-06 NOTE — Progress Notes (Signed)
 [facility] Counselor/Therapist Progress Note  Patient ID: Stacy Preston, MRN: [id number],    Date: 02/06/2024  Time Spent: 4:02 PM until 4:53 PM, 51 minutes spent face-to-face with the patient in the outpatient therapist office.   Treatment Type: Individual Therapy  Reported Symptoms: Stress/anxiety  Mental Status Exam: Appearance:  Well Groomed     Behavior: Appropriate  Motor: Normal  Speech/Language:  Clear and Coherent  Affect: Appropriate  Mood: normal  Thought process: normal  Thought content:   WNL  Sensory/Perceptual disturbances:   WNL  Orientation: oriented to person, place, time/date, situation, day of week, month of year, and year  Attention: Good  Concentration: Good  Memory: WNL  Fund of knowledge:  Good  Insight:   Good  Judgment:  Good  Impulse Control: Good   Risk Assessment: Danger to Self:  No Self-injurious Behavior: No Danger to Others: No Duty to Warn:no Physical Aggression / Violence:No  Access to Firearms a concern: No  Gang Involvement:No  Subjective: There have been some changes in the situation with her daughter.  The friend that the patient's daughter moved to [place]  to be around has decided now that she has finished her associates degree to move to [place]  to live with her biological father because it is cheaper to go to school there.  She is encouraging the patient's daughter to follow her there.  Hopefully will not take place until late spring or early summer of next year.  The patient says her daughter will now only her friend down there and her friend will be working and going to school and will have nowhere to live and will not have a job set up.  She is currently living in her friend's parents basements and her friend's mother is helping care for her child but the patient is concerned that if the daughter moves that those friends parents will not let her daughter stay there any longer.  The patient's husband does not  want the daughter moving back in to the house but he might be open to her living in the area if they could find a way to make that work but that would be a financial strain on them.  Is not even sure if her daughter will want to move back to [place] .  They would last year or Christmas but are going up around [employer] day to have Christmas with her daughter in [place]  and I encouraged him to have a conversation with her from a lets look at your options type of perspective to see what kind of response to get from their daughter.  We talked about acceptance and trying to take the emotion out of this.  The patient recognizes that she is a fixer but cannot necessarily fix this. Patient does contract for safety having no thoughts of hurting herself or anyone else. Interventions: Cognitive Behavioral Therapy  Diagnosis: Generalized anxiety disorder  Plan: I .  Will meet with the patient every 2 weeks and she prefers that to be in person if possible.  Treatment plan: We will use cognitive behavioral therapy as well as elements of dialectical behavior therapy to reduce the patient's stress and anxiety level by at least 50% with a target date of June 30 , 2026.  Goals will be to improve her ability to better manage anxiety symptoms and stress.  We will try to identify causes for anxiety and explore ways to reduce that as well as trying to resolve core conflicts contributing  to the cause of anxiety.  We will use cognitive behavioral therapy to help her manage thoughts and worrisome thinking contributing to anxiety.  Interventions include providing education about anxiety to help her understand its causes symptoms and triggers, facilitate problem solution skills as well as teach coping skills to help her manage anxiety.  We will use cognitive behavioral therapy to identify and change anxiety provoking thoughts as well as dialectical behavior therapy to help her with her distress tolerance and  mindfulness skills. Progress: 40%  Lorrene CHRISTELLA Hasten, [facility]                         Lorrene CHRISTELLA Hasten, [facility]               Lorrene CHRISTELLA Hasten, [facility]               Lorrene CHRISTELLA Hasten, [facility]               Lorrene CHRISTELLA Hasten, [facility]

## 2024-02-09 ENCOUNTER — Ambulatory Visit: Payer: Self-pay | Admitting: Family Medicine

## 2024-02-09 ENCOUNTER — Encounter: Payer: Self-pay | Admitting: Family Medicine

## 2024-02-13 ENCOUNTER — Other Ambulatory Visit (HOSPITAL_BASED_OUTPATIENT_CLINIC_OR_DEPARTMENT_OTHER): Payer: Self-pay | Admitting: Obstetrics & Gynecology

## 2024-02-13 ENCOUNTER — Ambulatory Visit: Admitting: Behavioral Health

## 2024-02-13 DIAGNOSIS — N951 Menopausal and female climacteric states: Secondary | ICD-10-CM

## 2024-02-13 NOTE — Telephone Encounter (Signed)
 Called and spoke with Norleen with [facility] outpatient imaging. He apologizes for the delay and will work on getting the MRI read.

## 2024-02-13 NOTE — Telephone Encounter (Signed)
 Called, transferred to the read room, call disconnected.   Called again, spoke with radiologist, was unable to transfer me to read room.

## 2024-02-13 NOTE — Telephone Encounter (Signed)
 Hey, can we reach back out to f/u on results, as it's been > 1 month since the scan was done.   Thank you!

## 2024-02-14 ENCOUNTER — Telehealth: Payer: Self-pay | Admitting: Family Medicine

## 2024-02-14 ENCOUNTER — Ambulatory Visit (HOSPITAL_BASED_OUTPATIENT_CLINIC_OR_DEPARTMENT_OTHER)
Admission: RE | Admit: 2024-02-14 | Discharge: 2024-02-14 | Disposition: A | Discharge status: Home / Self Care | Source: Ambulatory Visit | Attending: Obstetrics & Gynecology | Admitting: Obstetrics & Gynecology

## 2024-02-14 DIAGNOSIS — N951 Menopausal and female climacteric states: Secondary | ICD-10-CM | POA: Insufficient documentation

## 2024-02-14 NOTE — Telephone Encounter (Signed)
 Copied from CRM #[id number]. Topic: General - Other >> Feb 14, 2024 11:30 AM Shanda MATSU wrote: Reason for CRM: Patient is calling in stating that there is bone density scan that was done 12 years ago at another facility in her medical records, it was a base line bone density scan, she is wanting to know if there is any way to have the scan uploaded to her chart so that she can show it to a facility where she recently had a bone density scan done at, patient stated that when she became a patient of Dr. Alvia she provided this record from her old provider, is req a call back in regards to this.

## 2024-02-17 NOTE — Telephone Encounter (Signed)
 Left message for a return call. I could not find the dexa scan from 10 years ago.

## 2024-02-20 ENCOUNTER — Encounter: Payer: Self-pay | Admitting: Physical Therapy

## 2024-02-20 ENCOUNTER — Telehealth: Payer: Self-pay | Admitting: Family Medicine

## 2024-02-20 ENCOUNTER — Ambulatory Visit: Attending: Family Medicine | Admitting: Physical Therapy

## 2024-02-20 ENCOUNTER — Other Ambulatory Visit: Payer: Self-pay

## 2024-02-20 DIAGNOSIS — M722 Plantar fascial fibromatosis: Secondary | ICD-10-CM | POA: Insufficient documentation

## 2024-02-20 DIAGNOSIS — M5416 Radiculopathy, lumbar region: Secondary | ICD-10-CM

## 2024-02-20 DIAGNOSIS — M79672 Pain in left foot: Secondary | ICD-10-CM | POA: Insufficient documentation

## 2024-02-20 DIAGNOSIS — M6281 Muscle weakness (generalized): Secondary | ICD-10-CM | POA: Insufficient documentation

## 2024-02-20 DIAGNOSIS — R262 Difficulty in walking, not elsewhere classified: Secondary | ICD-10-CM | POA: Diagnosis present

## 2024-02-20 NOTE — Telephone Encounter (Signed)
 Stacy Preston,  I can't tell which [facility] location this order was sent to. Can you provide contact information please?

## 2024-02-20 NOTE — Therapy (Signed)
 " OUTPATIENT PHYSICAL THERAPY LOWER EXTREMITY EVALUATION   Patient Name: Stacy Preston MRN: [id number] DOB:[birth year], 63 y.o., female Today's Date: 02/20/2024  END OF SESSION:  PT End of Session - 02/20/24 1146     Visit Number 1    Number of Visits 17    Date for Recertification  04/16/24    Authorization Type cigna    PT Start Time 1146    PT Stop Time 1232    PT Time Calculation (min) 46 min          Past Medical History:  Diagnosis Date   Allergy    History of basal cell carcinoma 01/09/2016   Hypertension    Infertility, female    Post-operative nausea and vomiting    Prolactinoma (HCC)    Vitamin D  deficiency 01/09/2016   Past Surgical History:  Procedure Laterality Date   BREAST BIOPSY Right 12/29/2020   COLONOSCOPY  2013, 2017   [place]   INSERTION OF MESH N/A 07/21/2022   Procedure: INSERTION OF MESH;  Surgeon: Curvin Deward MOULD, MD;  Location: [facility];  Service: General;  Laterality: N/A;   ivf     LAPAROSCOPY     for endometriosis   PLANTAR FASCIA RELEASE  05/2023   PLANTAR FASCIA SURGERY     UMBILICAL HERNIA REPAIR N/A 07/21/2022   Procedure: HERNIA REPAIR UMBILICAL ADULT WITH POSSIBLE MESH;  Surgeon: Curvin Deward MOULD, MD;  Location: [facility];  Service: General;  Laterality: N/A;   Patient Active Problem List   Diagnosis Date Noted   Hair loss 10/24/2023   Primary osteoarthritis of right knee 12/09/2022   Abnormal weight gain 06/03/2022   Umbilical hernia without obstruction and without gangrene 06/03/2022   Left foot pain 11/02/2021   Family history of early CAD 11/02/2021   Irritable bowel syndrome with constipation 09/08/2021   Acute medial meniscal tear, sequela 10/07/2020   Lumbar radiculopathy 05/20/2020   Chronic anterior pelvic pain 05/20/2020   Knee pain 05/20/2020   Cervical stenosis (uterine cervix) 05/05/2020   Pelvic and perineal pain 04/24/2020   Cervical high risk HPV (human papillomavirus)  test positive 01/08/2020   Well adult exam 11/28/2019   Elevated blood-pressure reading without diagnosis of hypertension 12/28/2018   Tinnitus 11/27/2018   GAD (generalized anxiety disorder) 12/21/2016   Atrophic vaginitis 02/27/2016   Vitamin D  deficiency 01/09/2016   History of prolactinoma 01/09/2016   History of basal cell carcinoma 01/09/2016   Cervical disc disorder with radiculopathy of cervical region 01/09/2016   Elevated C-reactive protein (CRP) 01/09/2016    PCP: Alvia Bring, DO  REFERRING PROVIDER: Joya Asberry, DPM  REFERRING DIAG: M72.2 (ICD-10-CM) - Plantar fasciitis, left  THERAPY DIAG:  Pain in left foot  Muscle weakness (generalized)  Difficulty in walking, not elsewhere classified  Rationale for Evaluation and Treatment: Rehabilitation  ONSET DATE: April 2025  SUBJECTIVE:   SUBJECTIVE STATEMENT: Pt states she has had ongoing BIL foot pain for years - prior plantar fascia surgeries in 2010s, has had PRP injections. States she had plantar fascia surgery in April of this year which helped her initial heel pain, but states as she was recovering began to develop worsening pain in medial arch, described as numbness and pulling. Had cortisone shot Dec 5th which she states was very helpful. Denies any radiation of pain, no noticeable swelling. Reports numbness to light touch since surgery.  She denies surgical restrictions at this point. States she also has hx of R  meniscal issues and back pain that shoots into R knee.    PERTINENT HISTORY: hx basal carcinoma, GAD, IBS, hx cervical and lumbar radiculopathy   PAIN:  Are you having pain: none at rest Location/description: L medial arch, pulling/tension/numbness Best-worst over past week: 0-10/10  - aggravating factors: walking, especially barefoot, prolonged standing - Easing factors: footwear    PRECAUTIONS: cancer hx  RED FLAGS: None   WEIGHT BEARING RESTRICTIONS: No  FALLS:  Has patient fallen  in last 6 months? Yes. Number of falls 3 (total over the course of the year), occurs while walking, attributes to R knee issues  LIVING ENVIRONMENT: Lives w/ husband in 2 story home, no STE, no issues with stair navigation d/t foot (Does endorse knee issues)  OCCUPATION: [employer] - works from home  PLOF: Independent  PATIENT GOALS: be able to walk more, feel more comfortable walking barefoot   NEXT MD VISIT: February 5th  OBJECTIVE:  Note: Objective measures were completed at Evaluation unless otherwise noted.  DIAGNOSTIC FINDINGS:  01/16/24 L heel MRI: IMPRESSION: 1. Moderate plantar fasciitis of the central cord of the plantar fascia at towards the calcaneal insertion without a tear. 2. Moderate tendinosis of the peroneus brevis with a longitudinal split tear distal to the lateral malleolus.    PATIENT SURVEYS:  LEFS: 40/80   COGNITION: Overall cognitive status: Within functional limits for tasks assessed     SENSATION: Reports light touch deficits since surgery   EDEMA:  No visible edema   PALPATION: Tightness to plantar fascia LLE but no pain about foot/ankle w/ palpation  LOWER EXTREMITY ROM:  ROM Right eval Left eval  Knee flexion    Knee extension    Ankle dorsiflexion 8 deg 5 deg  Ankle plantarflexion    Ankle inversion 25 35 deg *  Ankle eversion 35 30 deg *   (Blank rows = not tested) (Key: WFL = within functional limits not formally assessed, * = concordant pain, s = stiffness/stretching sensation, NT = not tested) Comments: concordant pain provoked with DF/eversion/toe extension  LOWER EXTREMITY MMT:  MMT Right eval Left eval  Knee flexion    Knee extension    Ankle dorsiflexion 5 4+  Ankle plantarflexion    Ankle inversion 5 4+  Ankle eversion 5 5   (Blank rows = not tested) (Key: WFL = within functional limits not formally assessed, * = concordant pain, s = stiffness/stretching sensation, NT = not tested) Comment:     FUNCTIONAL  TESTS:  5xSTS: 8.49sec w UE support, barefoot, painful Single leg stance (wearing shoes): 2 sec RLE (knee), LLE 30sec but provokes arch pain, instability noted  GAIT: Distance walked: within clinic Assistive device utilized: None Level of assistance: Complete Independence Comments: reduced toe off noted with barefoot walking on LLE  TREATMENT DATE:  [facility] Adult PT Treatment:                                                DATE: 02/20/24 Therapeutic Exercise: Toe scrunches, toe extensions x10 BIL; cues for tripod foot; HEP handout/education (scrunches mildly painful, improves w/ cues for ROM and setup, educated on modification as indicated)  Self Care: Education/discussion re: relevant anatomy/physiology, rationale for interventions, exam findings as they relate to symptom behavior, PT goals/POC    PATIENT EDUCATION:  Education details: Pt education on PT impairments, prognosis, and POC. Informed consent. Rationale for interventions, safe/appropriate HEP performance Person educated: Patient Education method: Explanation, Demonstration, Tactile cues, Verbal cues Education comprehension: verbalized understanding, returned demonstration, verbal cues required, tactile cues required, and needs further education    HOME EXERCISE PROGRAM: Access Code: [email or web address] URL: [email or web address] Date: 02/20/2024 Prepared by: Alm Jenny  Program Notes - scrunching toes, lifting toes  Exercises - Towel Scrunches  - 2-3 x daily - 7 x weekly - 1 sets - 6-8 reps - Seated Great Toe Extension  - 2-3 x daily - 7 x weekly - 1 sets - 8-10 reps  ASSESSMENT:  CLINICAL IMPRESSION: Patient is a 63 y.o. woman who was seen today for physical therapy evaluation and treatment for L foot pain ongoing since April 2025. Endorses difficulty w/ WB, particularly when  barefoot. On exam she demonstrates concordant limitations in strength and foot intrinsic activation. Able to maintain SLS for 30 sec on affected limb but does elicit pain even in shoes, moderate multidirectional sway. No adverse events, tolerates session well overall. Recommend trial of skilled PT to address aforementioned deficits with aim of improving functional tolerance and reducing pain with typical activities. Pt departs today's session in no acute distress, all voiced concerns/questions addressed appropriately from PT perspective.    OBJECTIVE IMPAIRMENTS: decreased balance, decreased endurance, decreased mobility, difficulty walking, decreased ROM, decreased strength, impaired perceived functional ability, and pain.   ACTIVITY LIMITATIONS: standing, squatting, and locomotion level  PARTICIPATION LIMITATIONS: meal prep, cleaning, and laundry  PERSONAL FACTORS: Time since onset of injury/illness/exacerbation and 3+ comorbidities: hx basal carcinoma, GAD, IBS, hx cervical and lumbar radiculopathy  are also affecting patient's functional outcome.   REHAB POTENTIAL: Fair given chronicity and surgical history   CLINICAL DECISION MAKING: Stable/uncomplicated  EVALUATION COMPLEXITY: Low   GOALS:   SHORT TERM GOALS: Target date: 03/19/2024  Pt will demonstrate appropriate understanding and performance of initially prescribed HEP in order to facilitate improved independence with management of symptoms.  Baseline: HEP established  Goal status: INITIAL   2. Pt will report at least 25% improvement in overall pain levels over past week in order to facilitate improved tolerance to typical daily activities.   Baseline: 0-10/10  Goal status: INITIAL    LONG TERM GOALS: Target date: 04/16/2024  Pt will score 60/80 or greater on LEFS in order to demonstrate improved perception of function due to symptoms (MCID 9 pts) Baseline: 40/80 Goal status: INITIAL  2.  Pt will demonstrate grossly  symmetrical ankle MMT in tested groups in order to indicate improved functional strength. Baseline: see MMT chart above Goal status: INITIAL  3.  Pt will report ability to walk for >3min with less than 3 pt increase in pain on NPS for improved community navigation. Baseline: limiting community ambulation at this time, states she would like  to be able to walk for up to 30 min at a time without pain Goal status: INITIAL  4.  Pt will report at least 50% decrease in overall pain levels in past week in order to facilitate improved tolerance to basic ADLs/mobility.   Baseline: 0-10/10  Goal status: INITIAL    5. Pt will be able to perform SLS on LLE for 30sec with </= 2/10 pain on NPS and no more than min sway in order to facilitate improved postural stability and reduced fall risk  Baseline: 30sec w/ pain and moderate sway  Goal status: INITIAL  PLAN:  PT FREQUENCY: 2x/week  PT DURATION: 8 weeks  PLANNED INTERVENTIONS: 97164- PT Re-evaluation, 97750- Physical Performance Testing, 97110-Therapeutic exercises, 97530- Therapeutic activity, [id number]- Neuromuscular re-education, 97535- Self Care, [id number]- Manual therapy, [id number]- Gait training, [id number]- Electrical stimulation (unattended), 20560 (1-2 muscles), 20561 (3+ muscles)- Dry Needling, Patient/Family education, Balance training, Stair training, Taping, Joint mobilization, Cryotherapy, and Moist heat  PLAN FOR NEXT SESSION: Review/update HEP PRN. Work on [employer] exercises as appropriate with emphasis on foot intrinsic/extrinsic activation, calf strengthening. Symptom modification strategies as indicated/appropriate.    Alm DELENA Jenny PT, DPT 02/20/2024 1:46 PM  "

## 2024-02-20 NOTE — Telephone Encounter (Signed)
 I just got off the phone with [facility]. They are going to fax the results to our office. Also there is something attached in Care Everywhere but not sure if it is the same MRI that we requested. It was attached today to the chart if I read it correctly.

## 2024-02-20 NOTE — Telephone Encounter (Signed)
 Patient called and stated that she still has not heard back about her MRI that she had done on November 17th. She is having a lot of back pain that is shooting through her leg into her knee. Please advise.

## 2024-02-21 NOTE — Telephone Encounter (Signed)
 Report received and placed on Dr. Virgilio desk to review.

## 2024-02-22 ENCOUNTER — Encounter: Payer: Self-pay | Admitting: Physical Therapy

## 2024-02-22 ENCOUNTER — Ambulatory Visit: Admitting: Physical Therapy

## 2024-02-22 DIAGNOSIS — M79672 Pain in left foot: Secondary | ICD-10-CM

## 2024-02-22 DIAGNOSIS — R262 Difficulty in walking, not elsewhere classified: Secondary | ICD-10-CM

## 2024-02-22 DIAGNOSIS — M6281 Muscle weakness (generalized): Secondary | ICD-10-CM

## 2024-02-22 NOTE — Therapy (Signed)
 " OUTPATIENT PHYSICAL THERAPY TREATMENT   Patient Name: Stacy Preston MRN: [id number] DOB:October 10, 1960, 63 y.o., female Today's Date: 02/22/2024  END OF SESSION:  PT End of Session - 02/22/24 1148     Visit Number 2    Number of Visits 17    Date for Recertification  04/16/24    Authorization Type cigna    PT Start Time 1148    PT Stop Time 1228    PT Time Calculation (min) 40 min           Past Medical History:  Diagnosis Date   Allergy    History of basal cell carcinoma 01/09/2016   Hypertension    Infertility, female    Post-operative nausea and vomiting    Prolactinoma (HCC)    Vitamin D  deficiency 01/09/2016   Past Surgical History:  Procedure Laterality Date   BREAST BIOPSY Right 12/29/2020   COLONOSCOPY  2013, 2017   [place]   INSERTION OF MESH N/A 07/21/2022   Procedure: INSERTION OF MESH;  Surgeon: Curvin Deward MOULD, MD;  Location: [facility];  Service: General;  Laterality: N/A;   ivf     LAPAROSCOPY     for endometriosis   PLANTAR FASCIA RELEASE  05/2023   PLANTAR FASCIA SURGERY     UMBILICAL HERNIA REPAIR N/A 07/21/2022   Procedure: HERNIA REPAIR UMBILICAL ADULT WITH POSSIBLE MESH;  Surgeon: Curvin Deward MOULD, MD;  Location: [facility];  Service: General;  Laterality: N/A;   Patient Active Problem List   Diagnosis Date Noted   Hair loss 10/24/2023   Primary osteoarthritis of right knee 12/09/2022   Abnormal weight gain 06/03/2022   Umbilical hernia without obstruction and without gangrene 06/03/2022   Left foot pain 11/02/2021   Family history of early CAD 11/02/2021   Irritable bowel syndrome with constipation 09/08/2021   Acute medial meniscal tear, sequela 10/07/2020   Lumbar radiculopathy 05/20/2020   Chronic anterior pelvic pain 05/20/2020   Knee pain 05/20/2020   Cervical stenosis (uterine cervix) 05/05/2020   Pelvic and perineal pain 04/24/2020   Cervical high risk HPV (human papillomavirus) test positive  01/08/2020   Well adult exam 11/28/2019   Elevated blood-pressure reading without diagnosis of hypertension 12/28/2018   Tinnitus 11/27/2018   GAD (generalized anxiety disorder) 12/21/2016   Atrophic vaginitis 02/27/2016   Vitamin D  deficiency 01/09/2016   History of prolactinoma 01/09/2016   History of basal cell carcinoma 01/09/2016   Cervical disc disorder with radiculopathy of cervical region 01/09/2016   Elevated C-reactive protein (CRP) 01/09/2016    PCP: Alvia Bring, DO  REFERRING PROVIDER: Joya Asberry, DPM  REFERRING DIAG: M72.2 (ICD-10-CM) - Plantar fasciitis, left  THERAPY DIAG:  Pain in left foot  Muscle weakness (generalized)  Difficulty in walking, not elsewhere classified  Rationale for Evaluation and Treatment: Rehabilitation  ONSET DATE: April 2025  SUBJECTIVE:  Per eval: Pt states she has had ongoing BIL foot pain for years - prior plantar fascia surgeries in 2010s, has had PRP injections. States she had plantar fascia surgery in April of this year which helped her initial heel pain, but states as she was recovering began to develop worsening pain in medial arch, described as numbness and pulling. Had cortisone shot Dec 5th which she states was very helpful. Denies any radiation of pain, no noticeable swelling. Reports numbness to light touch since surgery.  She denies surgical restrictions at this point. States she also has hx of R meniscal issues  and back pain that shoots into R knee.   SUBJECTIVE STATEMENT: 02/22/2024: states she has been practicing HEP - feels a bit of a workout feeling but no pain. No issues after initial eval. Still has numbness. No other new updates.    PERTINENT HISTORY: hx basal carcinoma, GAD, IBS, hx cervical and lumbar radiculopathy   PAIN:  Are you having pain: none at rest, unrated discomfort with walking/standing L heel  Per eval:  Location/description: L medial arch, pulling/tension/numbness Best-worst over past  week: 0-10/10  - aggravating factors: walking, especially barefoot, prolonged standing - Easing factors: footwear    PRECAUTIONS: cancer hx  RED FLAGS: None   WEIGHT BEARING RESTRICTIONS: No  FALLS:  Has patient fallen in last 6 months? Yes. Number of falls 3 (total over the course of the year), occurs while walking, attributes to R knee issues  LIVING ENVIRONMENT: Lives w/ husband in 2 story home, no STE, no issues with stair navigation d/t foot (Does endorse knee issues)  OCCUPATION: [employer] - works from home  PLOF: Independent  PATIENT GOALS: be able to walk more, feel more comfortable walking barefoot   NEXT MD VISIT: February 5th  OBJECTIVE:  Note: Objective measures were completed at Evaluation unless otherwise noted.  DIAGNOSTIC FINDINGS:  01/16/24 L heel MRI: IMPRESSION: 1. Moderate plantar fasciitis of the central cord of the plantar fascia at towards the calcaneal insertion without a tear. 2. Moderate tendinosis of the peroneus brevis with a longitudinal split tear distal to the lateral malleolus.    PATIENT SURVEYS:  LEFS: 40/80   COGNITION: Overall cognitive status: Within functional limits for tasks assessed     SENSATION: Reports light touch deficits since surgery   EDEMA:  No visible edema   PALPATION: Tightness to plantar fascia LLE but no pain about foot/ankle w/ palpation  LOWER EXTREMITY ROM:  ROM Right eval Left eval  Knee flexion    Knee extension    Ankle dorsiflexion 8 deg 5 deg  Ankle plantarflexion    Ankle inversion 25 35 deg *  Ankle eversion 35 30 deg *   (Blank rows = not tested) (Key: WFL = within functional limits not formally assessed, * = concordant pain, s = stiffness/stretching sensation, NT = not tested) Comments: concordant pain provoked with DF/eversion/toe extension  LOWER EXTREMITY MMT:  MMT Right eval Left eval  Knee flexion    Knee extension    Ankle dorsiflexion 5 4+  Ankle plantarflexion    Ankle  inversion 5 4+  Ankle eversion 5 5   (Blank rows = not tested) (Key: WFL = within functional limits not formally assessed, * = concordant pain, s = stiffness/stretching sensation, NT = not tested) Comment:     FUNCTIONAL TESTS:  5xSTS: 8.49sec w UE support, barefoot, painful Single leg stance (wearing shoes): 2 sec RLE (knee), LLE 30sec but provokes arch pain, instability noted  GAIT: Distance walked: within clinic Assistive device utilized: None Level of assistance: Complete Independence Comments: reduced toe off noted with barefoot walking on LLE  TREATMENT:   [facility] Adult PT Treatment:                                                DATE: 02/22/24 Therapeutic Exercise: Ankle inversion/eversion within comfortable range x25 total  Seated LLE heel raise 2x8 cues for alignment Seated inversion isometric 2x10 cues for submax, alignment HEP update + education/handout  Neuromuscular re-ed: Toe scrunches 2x12 cues for tripod Toe extension 2x12 cues for tripod Isolated hallux ext 2x12 cues for sequencing Toe yoga x12 - discontinued d/t difficulty sequencing  Self Care: Education/discussion re: rationale for interventions, relevant anatomy/physiology as pertains to symptom behavior and activity in session (use of visual aids)   [facility] Adult PT Treatment:                                                DATE: 02/20/24 Therapeutic Exercise: Toe scrunches, toe extensions x10 BIL; cues for tripod foot; HEP handout/education (scrunches mildly painful, improves w/ cues for ROM and setup, educated on modification as indicated)  Self Care: Education/discussion re: relevant anatomy/physiology, rationale for interventions, exam findings as they relate to symptom behavior, PT goals/POC    PATIENT EDUCATION:  Education details: rationale for interventions, HEP  Person  educated: Patient Education method: Explanation, Demonstration, Tactile cues, Verbal cues Education comprehension: verbalized understanding, returned demonstration, verbal cues required, tactile cues required, and needs further education     HOME EXERCISE PROGRAM: Access Code: [email or web address] URL: [email or web address] Date: 02/22/2024 Prepared by: Alm Jenny  Program Notes - scrunching toes, lifting toes  Exercises - Towel Scrunches  - 2-3 x daily - 7 x weekly - 1 sets - 6-8 reps - Seated Great Toe Extension  - 2-3 x daily - 7 x weekly - 1 sets - 8-10 reps - Seated Heel Raise  - 2-3 x daily - 7 x weekly - 1 sets - 8-10 reps  ASSESSMENT:  CLINICAL IMPRESSION: 02/22/2024: Pt arrives w/o pain, no issues after initial eval. Improved performance noted with HEP review compared to initial eval. We are able to expand program to increase volume/complexity with motor control training of foot musculature. Does well with activity in session, reports some tension/pulling sensations but no increase in pain. Increased time w/ self care education as above discussing relevant anatomy/physiology and rationale for interventions. Reports improved sensation with walking at end of session compared to walking in on arrival. No adverse events. HEP update as above. Recommend continuing along current POC in order to address relevant deficits and improve functional tolerance. Pt departs today's session in no acute distress, all voiced questions/concerns addressed appropriately from PT perspective.    Per eval: Patient is a 63 y.o. woman who was seen today for physical therapy evaluation and treatment for L foot pain ongoing since April 2025. Endorses difficulty w/ WB, particularly when barefoot. On exam she demonstrates concordant limitations in strength and foot intrinsic activation. Able to maintain SLS for 30 sec on affected limb but does elicit pain even in shoes, moderate multidirectional sway. No  adverse events, tolerates session well overall. Recommend trial of skilled PT to address aforementioned deficits with aim of improving functional tolerance and reducing pain with typical activities. Pt departs today's session in no acute distress, all voiced  concerns/questions addressed appropriately from PT perspective.    OBJECTIVE IMPAIRMENTS: decreased balance, decreased endurance, decreased mobility, difficulty walking, decreased ROM, decreased strength, impaired perceived functional ability, and pain.   ACTIVITY LIMITATIONS: standing, squatting, and locomotion level  PARTICIPATION LIMITATIONS: meal prep, cleaning, and laundry  PERSONAL FACTORS: Time since onset of injury/illness/exacerbation and 3+ comorbidities: hx basal carcinoma, GAD, IBS, hx cervical and lumbar radiculopathy  are also affecting patient's functional outcome.   REHAB POTENTIAL: Fair given chronicity and surgical history   CLINICAL DECISION MAKING: Stable/uncomplicated  EVALUATION COMPLEXITY: Low   GOALS:   SHORT TERM GOALS: Target date: 03/19/2024  Pt will demonstrate appropriate understanding and performance of initially prescribed HEP in order to facilitate improved independence with management of symptoms.  Baseline: HEP established  Goal status: INITIAL   2. Pt will report at least 25% improvement in overall pain levels over past week in order to facilitate improved tolerance to typical daily activities.   Baseline: 0-10/10  Goal status: INITIAL    LONG TERM GOALS: Target date: 04/16/2024  Pt will score 60/80 or greater on LEFS in order to demonstrate improved perception of function due to symptoms (MCID 9 pts) Baseline: 40/80 Goal status: INITIAL  2.  Pt will demonstrate grossly symmetrical ankle MMT in tested groups in order to indicate improved functional strength. Baseline: see MMT chart above Goal status: INITIAL  3.  Pt will report ability to walk for >73min with less than 3 pt increase in pain  on NPS for improved community navigation. Baseline: limiting community ambulation at this time, states she would like to be able to walk for up to 30 min at a time without pain Goal status: INITIAL  4.  Pt will report at least 50% decrease in overall pain levels in past week in order to facilitate improved tolerance to basic ADLs/mobility.   Baseline: 0-10/10  Goal status: INITIAL    5. Pt will be able to perform SLS on LLE for 30sec with </= 2/10 pain on NPS and no more than min sway in order to facilitate improved postural stability and reduced fall risk  Baseline: 30sec w/ pain and moderate sway  Goal status: INITIAL  PLAN:  PT FREQUENCY: 2x/week  PT DURATION: 8 weeks  PLANNED INTERVENTIONS: 97164- PT Re-evaluation, 97750- Physical Performance Testing, 97110-Therapeutic exercises, 97530- Therapeutic activity, [id number]- Neuromuscular re-education, 97535- Self Care, [id number]- Manual therapy, [id number]- Gait training, [id number]- Electrical stimulation (unattended), 20560 (1-2 muscles), 20561 (3+ muscles)- Dry Needling, Patient/Family education, Balance training, Stair training, Taping, Joint mobilization, Cryotherapy, and Moist heat  PLAN FOR NEXT SESSION: Review/update HEP PRN. Work on [employer] exercises as appropriate with emphasis on foot intrinsic/extrinsic activation, calf strengthening. Symptom modification strategies as indicated/appropriate.    Alm DELENA Jenny PT, DPT 02/22/2024 12:34 PM  "

## 2024-02-22 NOTE — Telephone Encounter (Signed)
 Forwarding to Dr. Joane to review report (attached).

## 2024-02-27 ENCOUNTER — Encounter: Payer: Self-pay | Admitting: Behavioral Health

## 2024-02-27 ENCOUNTER — Ambulatory Visit: Admitting: Behavioral Health

## 2024-02-27 DIAGNOSIS — F411 Generalized anxiety disorder: Secondary | ICD-10-CM

## 2024-02-27 DIAGNOSIS — F4322 Adjustment disorder with anxiety: Secondary | ICD-10-CM

## 2024-02-27 NOTE — Progress Notes (Signed)
 " [facility] Counselor/Therapist Progress Note  Patient ID: Stacy Preston, MRN: [id number],    Date: 02/27/2024  Time Spent: 9:01 am until 9:54 am, 53 minutes spent face-to-face with the patient in the outpatient therapist office.   Treatment Type: Individual Therapy  Reported Symptoms: Stress/anxiety  Mental Status Exam: Appearance:  Well Groomed     Behavior: Appropriate  Motor: Normal  Speech/Language:  Clear and Coherent  Affect: Appropriate  Mood: normal  Thought process: normal  Thought content:   WNL  Sensory/Perceptual disturbances:   WNL  Orientation: oriented to person, place, time/date, situation, day of week, month of year, and year  Attention: Good  Concentration: Good  Memory: WNL  Fund of knowledge:  Good  Insight:   Good  Judgment:  Good  Impulse Control: Good   Risk Assessment: Danger to Self:  No Self-injurious Behavior: No Danger to Others: No Duty to Warn:no Physical Aggression / Violence:No  Access to Firearms a concern: No  Gang Involvement:No  Subjective: The patient and her husband had a fairly quiet Christmas.  Because her daughter and granddaughter could not come home for the holidays they went to the beach and said they had a very restful time.  They went out for a while New Year's Eve but were home in bed before the ball dropped.  He will be going to [place]  and [place]  in the next couple of weekends for different celebrations and will see their daughter and granddaughter there and have Christmas with them then.  She continues to have contact through video chat, phone and text calls with her daughter and she is thankful for that contact.  She says her daughter is now recognizing that she cannot continue to live where she is.  Patient is trying hard not to problem solve for her daughter because that is what she and her husband have done for years and they know they have to let go of that.  They have made it clear that she cannot move  back into their home but we did talk about what they might be willing to except if her daughter would move back to [place] .  We talked about the patient doing such things as providing a realtor's name to look at rental properties or making her aware of job opportunities or contact names from jobs that she has had in the past.  The patient and her had knows that they have to allow the daughter to do things for herself but is trying to balance that with her homework he wants to help her daughter and granddaughter without enabling.  Continue to encourage her to use her coping skills for minimizing her anxiety centering around her daughter and granddaughter's care.  Patient does contract for safety having no thoughts of hurting herself or anyone else. Interventions: Cognitive Behavioral Therapy  Diagnosis: Generalized anxiety disorder  Plan: I .  Will meet with the patient every 2 weeks and she prefers that to be in person if possible.  Treatment plan: We will use cognitive behavioral therapy as well as elements of dialectical behavior therapy to reduce the patient's stress and anxiety level by at least 50% with a target date of June 30 , 2026.  Goals will be to improve her ability to better manage anxiety symptoms and stress.  We will try to identify causes for anxiety and explore ways to reduce that as well as trying to resolve core conflicts contributing to the cause of anxiety.  We will  use cognitive behavioral therapy to help her manage thoughts and worrisome thinking contributing to anxiety.  Interventions include providing education about anxiety to help her understand its causes symptoms and triggers, facilitate problem solution skills as well as teach coping skills to help her manage anxiety.  We will use cognitive behavioral therapy to identify and change anxiety provoking thoughts as well as dialectical behavior therapy to help her with her distress tolerance and mindfulness  skills. Progress: 40%  Lorrene CHRISTELLA Hasten, [facility]                         Lorrene CHRISTELLA Hasten, [facility]               Lorrene CHRISTELLA Hasten, [facility]               Lorrene CHRISTELLA Hasten, [facility]               Lorrene CHRISTELLA Hasten, [facility]               Lorrene CHRISTELLA Hasten, [facility] "

## 2024-02-28 ENCOUNTER — Ambulatory Visit: Attending: Family Medicine

## 2024-02-28 DIAGNOSIS — M79672 Pain in left foot: Secondary | ICD-10-CM | POA: Insufficient documentation

## 2024-02-28 DIAGNOSIS — R262 Difficulty in walking, not elsewhere classified: Secondary | ICD-10-CM | POA: Insufficient documentation

## 2024-02-28 DIAGNOSIS — M6281 Muscle weakness (generalized): Secondary | ICD-10-CM | POA: Insufficient documentation

## 2024-02-28 NOTE — Telephone Encounter (Signed)
 Lumbar spine MRI does support right L5 pinched nerve suspected during your last visit.  Based on the results of the MRI plan for an epidural steroid injection.  You should hear soon about scheduling.  MRI lumbar spine: 1.  Multilevel lumbar spondylosis most progressed at L3-4 where there is moderate to severe canal stenosis contributing to crowding of cauda equina nerve roots.  2.  Moderate foraminal stenosis on the left at L4-L5 and severe on the right at L5-S1 with mass effect and deformity of the right greater than left exiting L5 nerve roots.  3.  Degenerative endplate edema most pronounced superiorly at L4.

## 2024-02-28 NOTE — Addendum Note (Signed)
 Addended by: JOANE ARTIST RAMAN on: 02/28/2024 06:27 AM   Modules accepted: Orders

## 2024-02-28 NOTE — Therapy (Signed)
 " OUTPATIENT PHYSICAL THERAPY TREATMENT   Patient Name: Stacy Preston MRN: [id number] DOB:[birth year], 64 y.o., female Today's Date: 02/28/2024  END OF SESSION:  PT End of Session - 02/28/24 1019     Visit Number 3    Number of Visits 17    Date for Recertification  04/16/24    Authorization Type cigna    PT Start Time 1020    PT Stop Time 1100    PT Time Calculation (min) 40 min    Activity Tolerance Patient tolerated treatment well    Behavior During Therapy [facility] for tasks assessed/performed         Past Medical History:  Diagnosis Date   Allergy    History of basal cell carcinoma 01/09/2016   Hypertension    Infertility, female    Post-operative nausea and vomiting    Prolactinoma (HCC)    Vitamin D  deficiency 01/09/2016   Past Surgical History:  Procedure Laterality Date   BREAST BIOPSY Right 12/29/2020   COLONOSCOPY  2013, 2017   [place]   INSERTION OF MESH N/A 07/21/2022   Procedure: INSERTION OF MESH;  Surgeon: Curvin Deward MOULD, MD;  Location: [facility];  Service: General;  Laterality: N/A;   ivf     LAPAROSCOPY     for endometriosis   PLANTAR FASCIA RELEASE  05/2023   PLANTAR FASCIA SURGERY     UMBILICAL HERNIA REPAIR N/A 07/21/2022   Procedure: HERNIA REPAIR UMBILICAL ADULT WITH POSSIBLE MESH;  Surgeon: Curvin Deward MOULD, MD;  Location: [facility];  Service: General;  Laterality: N/A;   Patient Active Problem List   Diagnosis Date Noted   Hair loss 10/24/2023   Primary osteoarthritis of right knee 12/09/2022   Abnormal weight gain 06/03/2022   Umbilical hernia without obstruction and without gangrene 06/03/2022   Left foot pain 11/02/2021   Family history of early CAD 11/02/2021   Irritable bowel syndrome with constipation 09/08/2021   Acute medial meniscal tear, sequela 10/07/2020   Lumbar radiculopathy 05/20/2020   Chronic anterior pelvic pain 05/20/2020   Knee pain 05/20/2020   Cervical stenosis (uterine cervix)  05/05/2020   Pelvic and perineal pain 04/24/2020   Cervical high risk HPV (human papillomavirus) test positive 01/08/2020   Well adult exam 11/28/2019   Elevated blood-pressure reading without diagnosis of hypertension 12/28/2018   Tinnitus 11/27/2018   GAD (generalized anxiety disorder) 12/21/2016   Atrophic vaginitis 02/27/2016   Vitamin D  deficiency 01/09/2016   History of prolactinoma 01/09/2016   History of basal cell carcinoma 01/09/2016   Cervical disc disorder with radiculopathy of cervical region 01/09/2016   Elevated C-reactive protein (CRP) 01/09/2016    PCP: Alvia Bring, DO  REFERRING PROVIDER: Joya Asberry, DPM  REFERRING DIAG: M72.2 (ICD-10-CM) - Plantar fasciitis, left  THERAPY DIAG:  Pain in left foot  Muscle weakness (generalized)  Difficulty in walking, not elsewhere classified  Rationale for Evaluation and Treatment: Rehabilitation  ONSET DATE: April 2025  SUBJECTIVE:  Per eval: Pt states she has had ongoing BIL foot pain for years - prior plantar fascia surgeries in 2010s, has had PRP injections. States she had plantar fascia surgery in April of this year which helped her initial heel pain, but states as she was recovering began to develop worsening pain in medial arch, described as numbness and pulling. Had cortisone shot Dec 5th which she states was very helpful. Denies any radiation of pain, no noticeable swelling. Reports numbness to light touch since surgery.  She denies surgical restrictions at this point. States she also has hx of R meniscal issues and back pain that shoots into R knee.   SUBJECTIVE STATEMENT: Patient reports pain is better but continues to have numbness and pulling at incision. Patient states her balance feels steady; no change in symptoms since last visit.    PERTINENT HISTORY: hx basal carcinoma, GAD, IBS, hx cervical and lumbar radiculopathy   PAIN:  Are you having pain: none at rest, unrated discomfort with  walking/standing L heel  Per eval:  Location/description: L medial arch, pulling/tension/numbness Best-worst over past week: 0-10/10  - aggravating factors: walking, especially barefoot, prolonged standing - Easing factors: footwear    PRECAUTIONS: cancer hx  RED FLAGS: None   WEIGHT BEARING RESTRICTIONS: No  FALLS:  Has patient fallen in last 6 months? Yes. Number of falls 3 (total over the course of the year), occurs while walking, attributes to R knee issues  LIVING ENVIRONMENT: Lives w/ husband in 2 story home, no STE, no issues with stair navigation d/t foot (Does endorse knee issues)  OCCUPATION: [employer] - works from home  PLOF: Independent  PATIENT GOALS: be able to walk more, feel more comfortable walking barefoot   NEXT MD VISIT: February 5th  OBJECTIVE:  Note: Objective measures were completed at Evaluation unless otherwise noted.  DIAGNOSTIC FINDINGS:  01/16/24 L heel MRI: IMPRESSION: 1. Moderate plantar fasciitis of the central cord of the plantar fascia at towards the calcaneal insertion without a tear. 2. Moderate tendinosis of the peroneus brevis with a longitudinal split tear distal to the lateral malleolus.    PATIENT SURVEYS:  LEFS: 40/80   COGNITION: Overall cognitive status: Within functional limits for tasks assessed     SENSATION: Reports light touch deficits since surgery   EDEMA:  No visible edema   PALPATION: Tightness to plantar fascia LLE but no pain about foot/ankle w/ palpation  LOWER EXTREMITY ROM:  ROM Right eval Left eval  Knee flexion    Knee extension    Ankle dorsiflexion 8 deg 5 deg  Ankle plantarflexion    Ankle inversion 25 35 deg *  Ankle eversion 35 30 deg *   (Blank rows = not tested) (Key: WFL = within functional limits not formally assessed, * = concordant pain, s = stiffness/stretching sensation, NT = not tested) Comments: concordant pain provoked with DF/eversion/toe extension  LOWER EXTREMITY  MMT:  MMT Right eval Left eval  Knee flexion    Knee extension    Ankle dorsiflexion 5 4+  Ankle plantarflexion    Ankle inversion 5 4+  Ankle eversion 5 5   (Blank rows = not tested) (Key: WFL = within functional limits not formally assessed, * = concordant pain, s = stiffness/stretching sensation, NT = not tested) Comment:     FUNCTIONAL TESTS:  5xSTS: 8.49sec w UE support, barefoot, painful Single leg stance (wearing shoes): 2 sec RLE (knee), LLE 30sec but provokes arch pain, instability noted  GAIT: Distance walked: within clinic Assistive device utilized: None Level of assistance: Complete Independence Comments: reduced toe off noted with barefoot walking on LLE  TREATMENT:  [facility] Adult PT Treatment:                                                DATE: 02/28/2024 Neuromuscular re-ed: Standing foot doming Alternating heel/toe raises --> tactile cues for ankle stability with heel raise  Gyro foot circle --> standing Ankle pronation mobility standing Ankle supination AROM on rocker BAPS board --> ankle EVER/INVER & circles CW/CCW Mini squat with great toe flexion isometric + red TB under toe (difficult) Therapeutic Activity: Towel scrunch with hold Towel scrunch in & push out Ankle ever/inv AROM Standing toe yoga Rolling plantar surface with tennis ball    [facility] Adult PT Treatment:                                                DATE: 02/22/24 Therapeutic Exercise: Ankle inversion/eversion within comfortable range x25 total  Seated LLE heel raise 2x8 cues for alignment Seated inversion isometric 2x10 cues for submax, alignment HEP update + education/handout  Neuromuscular re-ed: Toe scrunches 2x12 cues for tripod Toe extension 2x12 cues for tripod Isolated hallux ext 2x12 cues for sequencing Toe yoga x12 - discontinued d/t difficulty  sequencing  Self Care: Education/discussion re: rationale for interventions, relevant anatomy/physiology as pertains to symptom behavior and activity in session (use of visual aids)   [facility] Adult PT Treatment:                                                DATE: 02/20/24 Therapeutic Exercise: Toe scrunches, toe extensions x10 BIL; cues for tripod foot; HEP handout/education (scrunches mildly painful, improves w/ cues for ROM and setup, educated on modification as indicated)  Self Care: Education/discussion re: relevant anatomy/physiology, rationale for interventions, exam findings as they relate to symptom behavior, PT goals/POC    PATIENT EDUCATION:  Education details: rationale for interventions, HEP  Person educated: Patient Education method: Explanation, Demonstration, Tactile cues, Verbal cues Education comprehension: verbalized understanding, returned demonstration, verbal cues required, tactile cues required, and needs further education     HOME EXERCISE PROGRAM: Access Code: [email or web address] URL: [email or web address] Date: 02/22/2024 Prepared by: Alm Jenny  Program Notes - scrunching toes, lifting toes  Exercises - Towel Scrunches  - 2-3 x daily - 7 x weekly - 1 sets - 6-8 reps - Seated Great Toe Extension  - 2-3 x daily - 7 x weekly - 1 sets - 8-10 reps - Seated Heel Raise  - 2-3 x daily - 7 x weekly - 1 sets - 8-10 reps  ASSESSMENT:  CLINICAL IMPRESSION: Progressed intrinsic foot strengthening exercises as tolerated. Limited foot inversion noted with ankle exercises; tactile cues improved mobility, however patient had difficulty maintaining mechanics independently. Instructed patient in self-myofascial release with tennis ball to plantar surface of foot; assess response at next visit.   Per eval: Patient is a 64 y.o. woman who was seen today for physical therapy evaluation and treatment for L foot pain ongoing since April 2025. Endorses difficulty w/ WB,  particularly when barefoot. On exam she demonstrates concordant limitations in strength and foot intrinsic activation. Able to maintain  SLS for 30 sec on affected limb but does elicit pain even in shoes, moderate multidirectional sway. No adverse events, tolerates session well overall. Recommend trial of skilled PT to address aforementioned deficits with aim of improving functional tolerance and reducing pain with typical activities. Pt departs today's session in no acute distress, all voiced concerns/questions addressed appropriately from PT perspective.    OBJECTIVE IMPAIRMENTS: decreased balance, decreased endurance, decreased mobility, difficulty walking, decreased ROM, decreased strength, impaired perceived functional ability, and pain.   ACTIVITY LIMITATIONS: standing, squatting, and locomotion level  PARTICIPATION LIMITATIONS: meal prep, cleaning, and laundry  PERSONAL FACTORS: Time since onset of injury/illness/exacerbation and 3+ comorbidities: hx basal carcinoma, GAD, IBS, hx cervical and lumbar radiculopathy  are also affecting patient's functional outcome.   REHAB POTENTIAL: Fair given chronicity and surgical history   CLINICAL DECISION MAKING: Stable/uncomplicated  EVALUATION COMPLEXITY: Low   GOALS:   SHORT TERM GOALS: Target date: 03/19/2024  Pt will demonstrate appropriate understanding and performance of initially prescribed HEP in order to facilitate improved independence with management of symptoms.  Baseline: HEP established  Goal status: INITIAL   2. Pt will report at least 25% improvement in overall pain levels over past week in order to facilitate improved tolerance to typical daily activities.   Baseline: 0-10/10  Goal status: INITIAL    LONG TERM GOALS: Target date: 04/16/2024  Pt will score 60/80 or greater on LEFS in order to demonstrate improved perception of function due to symptoms (MCID 9 pts) Baseline: 40/80 Goal status: INITIAL  2.  Pt will  demonstrate grossly symmetrical ankle MMT in tested groups in order to indicate improved functional strength. Baseline: see MMT chart above Goal status: INITIAL  3.  Pt will report ability to walk for >79min with less than 3 pt increase in pain on NPS for improved community navigation. Baseline: limiting community ambulation at this time, states she would like to be able to walk for up to 30 min at a time without pain Goal status: INITIAL  4.  Pt will report at least 50% decrease in overall pain levels in past week in order to facilitate improved tolerance to basic ADLs/mobility.   Baseline: 0-10/10  Goal status: INITIAL    5. Pt will be able to perform SLS on LLE for 30sec with </= 2/10 pain on NPS and no more than min sway in order to facilitate improved postural stability and reduced fall risk  Baseline: 30sec w/ pain and moderate sway  Goal status: INITIAL  PLAN:  PT FREQUENCY: 2x/week  PT DURATION: 8 weeks  PLANNED INTERVENTIONS: 97164- PT Re-evaluation, 97750- Physical Performance Testing, 97110-Therapeutic exercises, 97530- Therapeutic activity, [id number]- Neuromuscular re-education, 97535- Self Care, [id number]- Manual therapy, [id number]- Gait training, [id number]- Electrical stimulation (unattended), 20560 (1-2 muscles), 20561 (3+ muscles)- Dry Needling, Patient/Family education, Balance training, Stair training, Taping, Joint mobilization, Cryotherapy, and Moist heat  PLAN FOR NEXT SESSION: Review/update HEP PRN. Work on [employer] exercises as appropriate with emphasis on foot intrinsic/extrinsic activation, calf strengthening. Symptom modification strategies as indicated/appropriate.    Lamarr Price, PTA 02/28/2024 11:05 AM  "

## 2024-03-01 ENCOUNTER — Ambulatory Visit: Admitting: Physical Therapy

## 2024-03-07 ENCOUNTER — Ambulatory Visit

## 2024-03-07 DIAGNOSIS — M6281 Muscle weakness (generalized): Secondary | ICD-10-CM

## 2024-03-07 DIAGNOSIS — M79672 Pain in left foot: Secondary | ICD-10-CM | POA: Diagnosis not present

## 2024-03-07 DIAGNOSIS — R262 Difficulty in walking, not elsewhere classified: Secondary | ICD-10-CM

## 2024-03-07 NOTE — Therapy (Signed)
 " OUTPATIENT PHYSICAL THERAPY TREATMENT   Patient Name: Stacy Preston MRN: [id number] DOB:31-May-1960, 64 y.o., female Today's Date: 03/07/2024  END OF SESSION:  PT End of Session - 03/07/24 0759     Visit Number 4    Number of Visits 17    Date for Recertification  04/16/24    Authorization Type cigna    PT Start Time 0802    PT Stop Time 0850    PT Time Calculation (min) 48 min    Activity Tolerance Patient tolerated treatment well    Behavior During Therapy [facility] for tasks assessed/performed         Past Medical History:  Diagnosis Date   Allergy    History of basal cell carcinoma 01/09/2016   Hypertension    Infertility, female    Post-operative nausea and vomiting    Prolactinoma (HCC)    Vitamin D  deficiency 01/09/2016   Past Surgical History:  Procedure Laterality Date   BREAST BIOPSY Right 12/29/2020   COLONOSCOPY  2013, 2017   [place]   INSERTION OF MESH N/A 07/21/2022   Procedure: INSERTION OF MESH;  Surgeon: Curvin Deward MOULD, MD;  Location: [facility];  Service: General;  Laterality: N/A;   ivf     LAPAROSCOPY     for endometriosis   PLANTAR FASCIA RELEASE  05/2023   PLANTAR FASCIA SURGERY     UMBILICAL HERNIA REPAIR N/A 07/21/2022   Procedure: HERNIA REPAIR UMBILICAL ADULT WITH POSSIBLE MESH;  Surgeon: Curvin Deward MOULD, MD;  Location: [facility];  Service: General;  Laterality: N/A;   Patient Active Problem List   Diagnosis Date Noted   Hair loss 10/24/2023   Primary osteoarthritis of right knee 12/09/2022   Abnormal weight gain 06/03/2022   Umbilical hernia without obstruction and without gangrene 06/03/2022   Left foot pain 11/02/2021   Family history of early CAD 11/02/2021   Irritable bowel syndrome with constipation 09/08/2021   Acute medial meniscal tear, sequela 10/07/2020   Lumbar radiculopathy 05/20/2020   Chronic anterior pelvic pain 05/20/2020   Knee pain 05/20/2020   Cervical stenosis (uterine cervix)  05/05/2020   Pelvic and perineal pain 04/24/2020   Cervical high risk HPV (human papillomavirus) test positive 01/08/2020   Well adult exam 11/28/2019   Elevated blood-pressure reading without diagnosis of hypertension 12/28/2018   Tinnitus 11/27/2018   GAD (generalized anxiety disorder) 12/21/2016   Atrophic vaginitis 02/27/2016   Vitamin D  deficiency 01/09/2016   History of prolactinoma 01/09/2016   History of basal cell carcinoma 01/09/2016   Cervical disc disorder with radiculopathy of cervical region 01/09/2016   Elevated C-reactive protein (CRP) 01/09/2016    PCP: Alvia Bring, DO  REFERRING PROVIDER: Joya Asberry, DPM  REFERRING DIAG: M72.2 (ICD-10-CM) - Plantar fasciitis, left  THERAPY DIAG:  Pain in left foot  Muscle weakness (generalized)  Difficulty in walking, not elsewhere classified  Rationale for Evaluation and Treatment: Rehabilitation  ONSET DATE: April 2025  SUBJECTIVE:  Per eval: Pt states she has had ongoing BIL foot pain for years - prior plantar fascia surgeries in 2010s, has had PRP injections. States she had plantar fascia surgery in April of this year which helped her initial heel pain, but states as she was recovering began to develop worsening pain in medial arch, described as numbness and pulling. Had cortisone shot Dec 5th which she states was very helpful. Denies any radiation of pain, no noticeable swelling. Reports numbness to light touch since surgery.  She denies surgical restrictions at this point. States she also has hx of R meniscal issues and back pain that shoots into R knee.   SUBJECTIVE STATEMENT: Patient reports she continues to have pulling sensation along incision and numbness but minimal pain.  PERTINENT HISTORY: hx basal carcinoma, GAD, IBS, hx cervical and lumbar radiculopathy   PAIN:  Are you having pain: none at rest, unrated discomfort with walking/standing L heel  Per eval:  Location/description: L medial arch,  pulling/tension/numbness Best-worst over past week: 0-10/10  - aggravating factors: walking, especially barefoot, prolonged standing - Easing factors: footwear    PRECAUTIONS: cancer hx  RED FLAGS: None   WEIGHT BEARING RESTRICTIONS: No  FALLS:  Has patient fallen in last 6 months? Yes. Number of falls 3 (total over the course of the year), occurs while walking, attributes to R knee issues  LIVING ENVIRONMENT: Lives w/ husband in 2 story home, no STE, no issues with stair navigation d/t foot (Does endorse knee issues)  OCCUPATION: [employer] - works from home  PLOF: Independent  PATIENT GOALS: be able to walk more, feel more comfortable walking barefoot   NEXT MD VISIT: February 5th  OBJECTIVE:  Note: Objective measures were completed at Evaluation unless otherwise noted.  DIAGNOSTIC FINDINGS:  01/16/24 L heel MRI: IMPRESSION: 1. Moderate plantar fasciitis of the central cord of the plantar fascia at towards the calcaneal insertion without a tear. 2. Moderate tendinosis of the peroneus brevis with a longitudinal split tear distal to the lateral malleolus.    PATIENT SURVEYS:  LEFS: 40/80   COGNITION: Overall cognitive status: Within functional limits for tasks assessed     SENSATION: Reports light touch deficits since surgery   EDEMA:  No visible edema   PALPATION: Tightness to plantar fascia LLE but no pain about foot/ankle w/ palpation  LOWER EXTREMITY ROM:  ROM Right eval Left eval  Knee flexion    Knee extension    Ankle dorsiflexion 8 deg 5 deg  Ankle plantarflexion    Ankle inversion 25 35 deg *  Ankle eversion 35 30 deg *   (Blank rows = not tested) (Key: WFL = within functional limits not formally assessed, * = concordant pain, s = stiffness/stretching sensation, NT = not tested) Comments: concordant pain provoked with DF/eversion/toe extension  LOWER EXTREMITY MMT:  MMT Right eval Left eval  Knee flexion    Knee extension    Ankle  dorsiflexion 5 4+  Ankle plantarflexion    Ankle inversion 5 4+  Ankle eversion 5 5   (Blank rows = not tested) (Key: WFL = within functional limits not formally assessed, * = concordant pain, s = stiffness/stretching sensation, NT = not tested) Comment:     FUNCTIONAL TESTS:  5xSTS: 8.49sec w UE support, barefoot, painful Single leg stance (wearing shoes): 2 sec RLE (knee), LLE 30sec but provokes arch pain, instability noted  GAIT: Distance walked: within clinic Assistive device utilized: None Level of assistance: Complete Independence Comments: reduced toe off noted with barefoot walking on LLE  TREATMENT:  [facility] Adult PT Treatment:                                                DATE: 03/07/2024 Manual Therapy: IASTM medial arch, plantar surface, medial ankle Neuromuscular re-ed: Standing: Towel scrunch in standing --> gathering towel in Arch lifting Alternating heel/toe raise (Lt) Seated: Resisted ankle PF + green TB Resisted ankle DF + green TB Resisted ankle EVER/INV + yellow TB Therapeutic Activity: Seated ankle ever/inver (ball b/w knees for stability) Weight shifting with push-off from great toe (Lt) Walking with focus on heel strike & great toe push off   [facility] Adult PT Treatment:                                                DATE: 02/28/2024 Neuromuscular re-ed: Standing foot doming Alternating heel/toe raises --> tactile cues for ankle stability with heel raise  Gyro foot circle --> standing Ankle pronation mobility standing Ankle supination AROM on rocker BAPS board --> ankle EVER/INVER & circles CW/CCW Mini squat with great toe flexion isometric + red TB under toe (difficult) Therapeutic Activity: Towel scrunch with hold Towel scrunch in & push out Ankle ever/inv AROM Standing toe yoga Rolling plantar surface with tennis  ball    [facility] Adult PT Treatment:                                                DATE: 02/22/24 Therapeutic Exercise: Ankle inversion/eversion within comfortable range x25 total  Seated LLE heel raise 2x8 cues for alignment Seated inversion isometric 2x10 cues for submax, alignment HEP update + education/handout  Neuromuscular re-ed: Toe scrunches 2x12 cues for tripod Toe extension 2x12 cues for tripod Isolated hallux ext 2x12 cues for sequencing Toe yoga x12 - discontinued d/t difficulty sequencing  Self Care: Education/discussion re: rationale for interventions, relevant anatomy/physiology as pertains to symptom behavior and activity in session (use of visual aids)    PATIENT EDUCATION:  Education details: Updated HEP  Person educated: Patient Education method: Explanation, Demonstration, Tactile cues, Verbal cues Education comprehension: verbalized understanding, returned demonstration, verbal cues required, tactile cues required, and needs further education     HOME EXERCISE PROGRAM: Access Code: [email or web address] URL: [email or web address] Date: 03/07/2024 Prepared by: Lamarr Price  Program Notes - scrunching toes, lifting toes  Exercises - Towel Scrunches  - 2-3 x daily - 7 x weekly - 1 sets - 6-8 reps - Seated Great Toe Extension  - 2-3 x daily - 7 x weekly - 1 sets - 8-10 reps - Seated Heel Raise  - 2-3 x daily - 7 x weekly - 1 sets - 8-10 reps - Long Sitting Ankle Eversion with Resistance  - 1 x daily - 7 x weekly - 3 sets - 10 reps - Long Sitting Ankle Plantar Flexion with Resistance  - 1 x daily - 7 x weekly - 3 sets - 10 reps - Long Sitting Ankle Inversion with Resistance  - 1 x daily - 7 x weekly - 3 sets - 10 reps - Long Sitting Ankle Dorsiflexion with Anchored Resistance  -  1 x daily - 7 x weekly - 3 sets - 10 reps  ASSESSMENT:  CLINICAL IMPRESSION: Progressed to resisted ankle strengthening exercises in all directions; patient able to complete  with no exacerbation of pain or discomfort. Weight shifting with great toe push off incorporated with gait mechanics; cueing heel strike improved Lt ankle stability with roll through on stepping transfer. Limited mobility noted on 5th digit of Lt foot during towel scrunch exercise.    Per eval: Patient is a 64 y.o. woman who was seen today for physical therapy evaluation and treatment for L foot pain ongoing since April 2025. Endorses difficulty w/ WB, particularly when barefoot. On exam she demonstrates concordant limitations in strength and foot intrinsic activation. Able to maintain SLS for 30 sec on affected limb but does elicit pain even in shoes, moderate multidirectional sway. No adverse events, tolerates session well overall. Recommend trial of skilled PT to address aforementioned deficits with aim of improving functional tolerance and reducing pain with typical activities. Pt departs today's session in no acute distress, all voiced concerns/questions addressed appropriately from PT perspective.    OBJECTIVE IMPAIRMENTS: decreased balance, decreased endurance, decreased mobility, difficulty walking, decreased ROM, decreased strength, impaired perceived functional ability, and pain.   ACTIVITY LIMITATIONS: standing, squatting, and locomotion level  PARTICIPATION LIMITATIONS: meal prep, cleaning, and laundry  PERSONAL FACTORS: Time since onset of injury/illness/exacerbation and 3+ comorbidities: hx basal carcinoma, GAD, IBS, hx cervical and lumbar radiculopathy  are also affecting patient's functional outcome.   REHAB POTENTIAL: Fair given chronicity and surgical history   CLINICAL DECISION MAKING: Stable/uncomplicated  EVALUATION COMPLEXITY: Low   GOALS:   SHORT TERM GOALS: Target date: 03/19/2024  Pt will demonstrate appropriate understanding and performance of initially prescribed HEP in order to facilitate improved independence with management of symptoms.  Baseline: HEP  established  Goal status: INITIAL   2. Pt will report at least 25% improvement in overall pain levels over past week in order to facilitate improved tolerance to typical daily activities.   Baseline: 0-10/10  Goal status: INITIAL    LONG TERM GOALS: Target date: 04/16/2024  Pt will score 60/80 or greater on LEFS in order to demonstrate improved perception of function due to symptoms (MCID 9 pts) Baseline: 40/80 Goal status: INITIAL  2.  Pt will demonstrate grossly symmetrical ankle MMT in tested groups in order to indicate improved functional strength. Baseline: see MMT chart above Goal status: INITIAL  3.  Pt will report ability to walk for >59min with less than 3 pt increase in pain on NPS for improved community navigation. Baseline: limiting community ambulation at this time, states she would like to be able to walk for up to 30 min at a time without pain Goal status: INITIAL  4.  Pt will report at least 50% decrease in overall pain levels in past week in order to facilitate improved tolerance to basic ADLs/mobility.   Baseline: 0-10/10  Goal status: INITIAL    5. Pt will be able to perform SLS on LLE for 30sec with </= 2/10 pain on NPS and no more than min sway in order to facilitate improved postural stability and reduced fall risk  Baseline: 30sec w/ pain and moderate sway  Goal status: INITIAL  PLAN:  PT FREQUENCY: 2x/week  PT DURATION: 8 weeks  PLANNED INTERVENTIONS: 97164- PT Re-evaluation, 97750- Physical Performance Testing, 97110-Therapeutic exercises, 97530- Therapeutic activity, [id number]- Neuromuscular re-education, 97535- Self Care, [id number]- Manual therapy, [id number]- Gait training, H9716- Electrical stimulation (unattended), [phone] (1-2  muscles), 20561 (3+ muscles)- Dry Needling, Patient/Family education, Balance training, Stair training, Taping, Joint mobilization, Cryotherapy, and Moist heat  PLAN FOR NEXT SESSION: Review/update HEP PRN. Work on [employer] exercises as  appropriate with emphasis on foot intrinsic/extrinsic activation, calf strengthening. Symptom modification strategies as indicated/appropriate.    Lamarr Price, PTA 03/07/2024 9:33 AM  "

## 2024-03-13 ENCOUNTER — Ambulatory Visit: Admitting: Behavioral Health

## 2024-03-13 ENCOUNTER — Encounter: Payer: Self-pay | Admitting: Family Medicine

## 2024-03-13 ENCOUNTER — Telehealth: Payer: Self-pay | Admitting: Family Medicine

## 2024-03-13 DIAGNOSIS — M5416 Radiculopathy, lumbar region: Secondary | ICD-10-CM

## 2024-03-13 NOTE — Telephone Encounter (Signed)
 Patient called and would like Brandy to call her as she had an MRI done on her back and has some questions./ Please advise.

## 2024-03-14 NOTE — Telephone Encounter (Signed)
Called pt, left VM to call the office.  

## 2024-03-15 ENCOUNTER — Ambulatory Visit: Admitting: Physical Therapy

## 2024-03-16 ENCOUNTER — Ambulatory Visit: Admitting: Physical Therapy

## 2024-03-16 ENCOUNTER — Other Ambulatory Visit: Payer: Self-pay | Admitting: Family Medicine

## 2024-03-20 NOTE — Telephone Encounter (Signed)
 Last OV 01/02/24 Next OV not scheduled  Last refill 09/06/23 Qty #30/3   Renal labs 01/23/24           Component Ref Range & Units (hover) 1 mo ago (01/23/24) 5 mo ago (10/19/23) 1 yr ago (01/12/23) 1 yr ago (07/14/22) 1 yr ago (06/03/22) 2 yr ago (01/08/22) 3 yr ago (01/06/21)  Glucose 74 84 96 94 CM 90 R, CM 96 R, CM 87 R, CM  BUN 19 17 17 21  R 21 R 17 R 16 R  Creatinine, Ser 0.79 0.91 0.81 1.03 High  R 0.76 R 0.72 R 0.66 R  eGFR 83 70 82  89 R 94 R 100 R, CM  BUN/Creatinine Ratio 24 19 21   SEE NOTE: R, CM SEE NOTE: R, CM NOT APPLICABLE R  Sodium 142 144 141 137 R 141 R 141 R 140 R  Potassium 4.5 4.6 4.6 5.1 R 4.3 R 4.6 R 4.4 R  Chloride 107 High  106 105 102 R 106 R 106 R 105 R  CO2 23 24 25 26  R 27 R 30 R 29 R  Calcium 9.1 9.3 8.7 9.4 R 9.4 R 9.2 R 9.0 R  Total Protein 6.4 6.5 6.6  7.1 R 7.2 R 6.8 R  Albumin 4.1 4.2 4.2      Globulin, Total 2.3 2.3 2.4      Bilirubin Total 0.4 0.4 0.6  0.6 R 0.6 R 0.5 R  Alkaline Phosphatase 61 63 R 72 R      AST 32 27 20  18  R 22 R 20 R  ALT 28 23 18  14  R 21 R 17 R

## 2024-03-20 NOTE — Telephone Encounter (Signed)
 Patient called back and left a message for a call back

## 2024-03-21 NOTE — Telephone Encounter (Signed)
Called pt, left VM to call the office.  

## 2024-03-22 NOTE — Addendum Note (Signed)
 Addended by: MARDY LEOTIS RAMAN on: 03/22/2024 10:07 AM   Modules accepted: Orders

## 2024-03-22 NOTE — Telephone Encounter (Signed)
 Spoke with pt, discussed possibility of PT. Pt would like referral. Had additional questions, call transferred to Dr. Joane.

## 2024-03-22 NOTE — Telephone Encounter (Signed)
 Referral placed for PT at [facility].

## 2024-03-23 ENCOUNTER — Ambulatory Visit: Payer: Self-pay

## 2024-03-23 ENCOUNTER — Encounter: Payer: Self-pay | Admitting: Student

## 2024-03-23 ENCOUNTER — Telehealth: Admitting: Student

## 2024-03-23 DIAGNOSIS — H109 Unspecified conjunctivitis: Secondary | ICD-10-CM

## 2024-03-23 MED ORDER — POLYMYXIN B-TRIMETHOPRIM 10000-0.1 UNIT/ML-% OP SOLN: 1.0000 [drp] | Freq: Four times a day (QID) | OPHTHALMIC | 0 refills | Status: AC

## 2024-03-23 NOTE — Telephone Encounter (Signed)
 FYI Only or Action Required?: FYI only for provider: appointment scheduled on virtual this morning.  Patient was last seen in primary care on 01/18/2024 by Alvia Bring, DO.  Called Nurse Triage reporting Eye Drainage.redness  Symptoms began yesterday.  Interventions attempted: Nothing.  Symptoms are: gradually worsening.  Triage Disposition: See PCP Within 2 Weeks  Patient/caregiver understands and will follow disposition?: Yes                             Reason for Triage: eyes are red and oozing, slight pain.    Reason for Disposition  [1] Mild eyelid irritation and eye redness are a recurrent problem AND [2] red and crusty eyelids  Answer Assessment - Initial Assessment Questions 1. LOCATION: Where is the redness? (e.g., eyeball or outer eyelids) Note: When callers say the eye is red, they usually mean the sclera (white of the eye) is red.       Eye ball 2. ONE OR BOTH EYES: Is the redness in one or both eyes?      both 3. ONSET: When did the redness start? (e.g., hours, days)      yesterday 4. EYELIDS: Are the eyelids red or swollen? If Yes, ask: How much?      no 5. VISION: Do you have blurred vision?     Did not ask 6. ITCHING: Does it feel itchy? If so ask: How bad is it (Scale 1-10; or mild, moderate, severe)     yes 7. PAIN: Is it painful? If Yes, ask: How bad is the pain? (Scale 0-10; or none, mild, moderate, severe)     Not really 8. CONTACT LENS: Do you wear contacts?     Did not ask 9. CAUSE: What do you think is causing the redness?     Pink eye 10. OTHER SYMPTOMS: Do you have any other symptoms? (e.g., fever, runny nose, cough, vomiting)       Eyes are crusty  Protocols used: Eye - Redness-A-AH

## 2024-03-23 NOTE — Progress Notes (Signed)
 "   MyChart Video Visit    Virtual Visit via Video Note    Patient location: Home. Patient and provider in visit Provider location: Office  I discussed the limitations of evaluation and management by telemedicine and the availability of in person appointments. The patient expressed understanding and agreed to proceed.  Discussed the use of AI scribe software for clinical note transcription with the patient, who gave verbal consent to proceed.   Visit Date: 03/23/2024  Today's healthcare provider: Harlene LITTIE Jolly, NP     Subjective:    Patient ID: Stacy Preston, female    DOB: [birth year], 64 y.o.   MRN: [id number]  Chief Complaint  Patient presents with   Conjunctivitis    Patient stated that she may have pink eye. There is visible discharge that is thick and white. Some pain in both eyes, no fever. Symptoms began around yesterday afternoon.    HPI  Stacy Preston is a 64 yo female presents for acute video visit w c/o b/l eye redness and discharge that began yesterday. Symptoms began yesterday with redness in one eye, progressing to both eyes by this morning. Discharge is thick, white, and mucousy, wet. Denies morning crusting that makes it hard to open her eyes. She has mild discomfort and itchiness without significant eye pain or vision changes. She has had conjunctivitis before. Denies allergies and no known contact with anyone with similar symptoms. Denies eye pain,fever, chills, SOB, CP, palpitations, dyspnea, edema, HA, vision changes  Past Medical History:  Diagnosis Date   Allergy    History of basal cell carcinoma 01/09/2016   Hypertension    Infertility, female    Post-operative nausea and vomiting    Prolactinoma (HCC)    Vitamin D  deficiency 01/09/2016    Past Surgical History:  Procedure Laterality Date   BREAST BIOPSY Right 12/29/2020   COLONOSCOPY  2013, 2017   [place]   INSERTION OF MESH N/A 07/21/2022   Procedure: INSERTION OF MESH;   Surgeon: Curvin Deward MOULD, MD;  Location: [facility];  Service: General;  Laterality: N/A;   ivf     LAPAROSCOPY     for endometriosis   PLANTAR FASCIA RELEASE  05/2023   PLANTAR FASCIA SURGERY     UMBILICAL HERNIA REPAIR N/A 07/21/2022   Procedure: HERNIA REPAIR UMBILICAL ADULT WITH POSSIBLE MESH;  Surgeon: Curvin Deward MOULD, MD;  Location: [facility];  Service: General;  Laterality: N/A;    Family History  Problem Relation Age of Onset   Renal Disease Mother    Diabetes Mother    Hypertension Mother    Cancer Father        Lung CA   Diabetes Maternal Aunt    Heart disease Maternal Grandmother    Diabetes Maternal Grandmother    Cancer Maternal Grandfather    Diabetes Maternal Grandfather    Colon cancer Maternal Grandfather 64 - 79   Stroke Paternal Grandmother    Heart disease Paternal Grandfather    Alcohol abuse Brother    Hypertension Brother    Sickle cell anemia Brother    Cancer Brother    Colon polyps Brother    Esophageal cancer Neg Hx    Rectal cancer Neg Hx    Stomach cancer Neg Hx    Breast cancer Neg Hx     Social History   Socioeconomic History   Marital status: Married    Spouse name: Not on file   Number of  children: Not on file   Years of education: Not on file   Highest education level: Not on file  Occupational History   Not on file  Tobacco Use   Smoking status: Never   Smokeless tobacco: Never  Vaping Use   Vaping status: Never Used  Substance and Sexual Activity   Alcohol use: Yes    Alcohol/week: 2.0 standard drinks of alcohol    Types: 2 Standard drinks or equivalent per week    Comment: socially   Drug use: No   Sexual activity: Yes    Birth control/protection: None  Other Topics Concern   Not on file  Social History Narrative   Not on file   Social Drivers of Health   Tobacco Use: Low Risk (03/07/2024)   Patient History    Smoking Tobacco Use: Never    Smokeless Tobacco Use: Never    Passive  Exposure: Not on file  Financial Resource Strain: Low Risk (01/18/2024)   Overall Financial Resource Strain (CARDIA)    Difficulty of Paying Living Expenses: Not hard at all  Food Insecurity: No Food Insecurity (01/18/2024)   Epic    Worried About Programme Researcher, Broadcasting/film/video in the Last Year: Never true    Ran Out of Food in the Last Year: Never true  Transportation Needs: No Transportation Needs (01/18/2024)   Epic    Lack of Transportation (Medical): No    Lack of Transportation (Non-Medical): No  Physical Activity: Insufficiently Active (01/18/2024)   Exercise Vital Sign    Days of Exercise per Week: 1 day    Minutes of Exercise per Session: 40 min  Stress: No Stress Concern Present (01/18/2024)   [employer] of Occupational Health - Occupational Stress Questionnaire    Feeling of Stress: Not at all  Social Connections: Socially Integrated (01/18/2024)   Social Connection and Isolation Panel    Frequency of Communication with Friends and Family: Three times a week    Frequency of Social Gatherings with Friends and Family: Once a week    Attends Religious Services: 1 to 4 times per year    Active Member of Clubs or Organizations: Yes    Attends Banker Meetings: 1 to 4 times per year    Marital Status: Married  Catering Manager Violence: Not At Risk (01/18/2024)   Epic    Fear of Current or Ex-Partner: No    Emotionally Abused: No    Physically Abused: No    Sexually Abused: No  Depression (PHQ2-9): Low Risk (01/18/2024)   Depression (PHQ2-9)    PHQ-2 Score: 0  Alcohol Screen: Low Risk (01/18/2024)   Alcohol Screen    Last Alcohol Screening Score (AUDIT): 1  Housing: Low Risk (01/18/2024)   Epic    Unable to Pay for Housing in the Last Year: No    Number of Times Moved in the Last Year: 0    Homeless in the Last Year: No  Utilities: Not At Risk (01/18/2024)   Epic    Threatened with loss of utilities: No  Health Literacy: Adequate Health Literacy  (01/18/2024)   B1300 Health Literacy    Frequency of need for help with medical instructions: Never    Outpatient Medications Prior to Visit  Medication Sig Dispense Refill   ANUCORT-HC  25 MG suppository INSERT 1 SUPPOSITORY RECTALLY TWICE DAILY AS NEEDED FOR HEMORRHOIDS 24 suppository 0   cholecalciferol (VITAMIN D ) 1000 UNITS tablet Take 2,000 Units by mouth daily.     estradiol  (ESTRACE )  0.1 MG/GM vaginal cream Per vagina three times per week (Patient taking differently: 2 (two) times a week. Per vagina three times per week) 42.5 g 12   finasteride (PROSCAR) 5 MG tablet Take 2.5 mg by mouth daily.     gabapentin  (NEURONTIN ) 100 MG capsule Take 1-3 capsules (100-300 mg total) by mouth 3 (three) times daily as needed. 90 capsule 3   linaclotide  (LINZESS ) 290 MCG CAPS capsule Take 1 capsule (290 mcg total) by mouth daily before breakfast. Patient needs follow up appointment for future refills. Please call [phone] to schedule an appointment. 30 capsule 0   linaclotide  (LINZESS ) 290 MCG CAPS capsule Take 1 capsule (290 mcg total) by mouth daily before breakfast. 90 capsule 3   losartan  (COZAAR ) 25 MG tablet Take 1 tablet by mouth once daily 90 tablet 1   MAGNESIUM PO Take 200 mg by mouth daily at 6 (six) AM.     medium chain triglycerides (MCT OIL) oil Take by mouth 3 (three) times daily.     meloxicam  (MOBIC ) 15 MG tablet Take 1 tablet (15 mg total) by mouth daily. 30 tablet 2   minoxidil (LONITEN) 2.5 MG tablet Take 2.5 mg by mouth daily.     tirzepatide  (MOUNJARO) 2.5 MG/0.5ML Pen Inject 2.5 mg into the skin once a week.     vitamin C (ASCORBIC ACID) 500 MG tablet Take 500 mg by mouth daily.     predniSONE  (DELTASONE ) 50 MG tablet Take 1 tablet (50 mg total) by mouth daily. (Patient not taking: Reported on 01/26/2024) 5 tablet 0   Facility-Administered Medications Prior to Visit  Medication Dose Route Frequency Provider Last Rate Last Admin   dexamethasone  (DECADRON ) injection 4 mg  4  mg Intra-articular Once        triamcinolone  acetonide (KENALOG ) 10 MG/ML injection 2.5 mg  2.5 mg Intra-articular Once         Allergies[1]  ROS    See HPI Objective:    Physical Exam  General: Patient appears well-nourished and in no acute distress. Alert and oriented to person and place. Engaged with provider throughout visit  HEENT: Head atraumatic and normocephalic. No facial asymmetry.Pupils appear equal and reactive.  Respiratory: Breathing unlabored. No cough, wheezing, or respiratory distress observed. Respiratory rate appears normal. Neurological: Patient alert and oriented. Speech coherent. No facial droop, slurred speech, or focal deficits observed. Psychiatric: Mood and affect assessed via video. Patient cooperative. Thought process logical/coherent  There were no vitals taken for this visit. Wt Readings from Last 3 Encounters:  01/18/24 149 lb (67.6 kg)  01/02/24 145 lb (65.8 kg)  08/11/23 147 lb (66.7 kg)       Assessment & Plan:   Problem List Items Addressed This Visit   None Visit Diagnoses       Conjunctivitis of both eyes, unspecified conjunctivitis type    -  Primary   Relevant Medications   trimethoprim -polymyxin b  (POLYTRIM ) ophthalmic solution      Current symptoms suggest  viral conjunctivitis. Discussed bacterial vs viral conjunctivitis, pt voiced understanding. Use artificial tears 4-6 times daily in both eyes.  Frequent hand hygiene to prevent spread. Avoid touching the eyes and to avoid sharing towels or pillowcases.   - Rx Polytrim  eye drops as precaution, use if symptoms worsen or crusting develops. - Monitor symptoms, return if no improvement.     I have discontinued Stacy Preston's predniSONE . I am also having her start on trimethoprim -polymyxin b . Additionally, I am having her maintain her  cholecalciferol, medium chain triglycerides, estradiol , ascorbic acid, Anucort-HC , MAGNESIUM PO, finasteride, tirzepatide , linaclotide ,  linaclotide , gabapentin , minoxidil, losartan , and meloxicam . We will continue to administer dexamethasone  and triamcinolone  acetonide.  Meds ordered this encounter  Medications   trimethoprim -polymyxin b  (POLYTRIM ) ophthalmic solution    Sig: Place 1 drop into both eyes 4 (four) times daily for 5 days.    Dispense:  10 mL    Refill:  0    Supervising Provider:   DOMENICA BLACKBIRD A [[id number]]      I discussed the assessment and treatment plan with the patient. The patient was provided an opportunity to ask questions and all were answered. The patient agreed with the plan and demonstrated an understanding of the instructions.   The patient was advised to call back or seek an in-person evaluation if the symptoms worsen or if the condition fails to improve as anticipated.  I provided 7 minutes of face-to-face time during this encounter.   Harlene LITTIE Jolly, NP [facility] [phone] (phone) [phone] (fax)   Medical Group      [1]  Allergies Allergen Reactions   Lactose Intolerance (Gi)    "

## 2024-03-27 ENCOUNTER — Ambulatory Visit: Admitting: Behavioral Health

## 2024-03-28 ENCOUNTER — Encounter: Payer: Self-pay | Admitting: Family Medicine

## 2024-03-28 ENCOUNTER — Ambulatory Visit: Admitting: Rehabilitative and Restorative Service Providers"

## 2024-03-28 ENCOUNTER — Encounter: Payer: Self-pay | Admitting: Rehabilitative and Restorative Service Providers"

## 2024-03-28 DIAGNOSIS — M5416 Radiculopathy, lumbar region: Secondary | ICD-10-CM

## 2024-03-28 DIAGNOSIS — M5459 Other low back pain: Secondary | ICD-10-CM

## 2024-03-28 DIAGNOSIS — M6281 Muscle weakness (generalized): Secondary | ICD-10-CM

## 2024-03-28 DIAGNOSIS — R262 Difficulty in walking, not elsewhere classified: Secondary | ICD-10-CM

## 2024-03-28 DIAGNOSIS — M79672 Pain in left foot: Secondary | ICD-10-CM

## 2024-03-28 NOTE — Therapy (Deleted)
 " OUTPATIENT PHYSICAL THERAPY THORACOLUMBAR EVALUATION   Patient Name: Stacy Preston MRN: [id number] DOB:06/20/1960, 64 y.o., female Today's Date: 03/28/2024  END OF SESSION:   Past Medical History:  Diagnosis Date   Allergy    History of basal cell carcinoma 01/09/2016   Hypertension    Infertility, female    Post-operative nausea and vomiting    Prolactinoma (HCC)    Vitamin D  deficiency 01/09/2016   Past Surgical History:  Procedure Laterality Date   BREAST BIOPSY Right 12/29/2020   COLONOSCOPY  2013, 2017   [place]   INSERTION OF MESH N/A 07/21/2022   Procedure: INSERTION OF MESH;  Surgeon: Curvin Deward MOULD, MD;  Location: [facility];  Service: General;  Laterality: N/A;   ivf     LAPAROSCOPY     for endometriosis   PLANTAR FASCIA RELEASE  05/2023   PLANTAR FASCIA SURGERY     UMBILICAL HERNIA REPAIR N/A 07/21/2022   Procedure: HERNIA REPAIR UMBILICAL ADULT WITH POSSIBLE MESH;  Surgeon: Curvin Deward MOULD, MD;  Location: [facility];  Service: General;  Laterality: N/A;   Patient Active Problem List   Diagnosis Date Noted   Hair loss 10/24/2023   Primary osteoarthritis of right knee 12/09/2022   Abnormal weight gain 06/03/2022   Umbilical hernia without obstruction and without gangrene 06/03/2022   Left foot pain 11/02/2021   Family history of early CAD 11/02/2021   Irritable bowel syndrome with constipation 09/08/2021   Acute medial meniscal tear, sequela 10/07/2020   Lumbar radiculopathy 05/20/2020   Chronic anterior pelvic pain 05/20/2020   Knee pain 05/20/2020   Cervical stenosis (uterine cervix) 05/05/2020   Pelvic and perineal pain 04/24/2020   Cervical high risk HPV (human papillomavirus) test positive 01/08/2020   Well adult exam 11/28/2019   Elevated blood-pressure reading without diagnosis of hypertension 12/28/2018   Tinnitus 11/27/2018   GAD (generalized anxiety disorder) 12/21/2016   Atrophic vaginitis 02/27/2016    Vitamin D  deficiency 01/09/2016   History of prolactinoma 01/09/2016   History of basal cell carcinoma 01/09/2016   Cervical disc disorder with radiculopathy of cervical region 01/09/2016   Elevated C-reactive protein (CRP) 01/09/2016    PCP: ***  REFERRING PROVIDER: ***  REFERRING DIAG: ***  Rationale for Evaluation and Treatment: {HABREHAB:27488}  THERAPY DIAG:  No diagnosis found.  ONSET DATE: ***  SUBJECTIVE:                                                                                                                                                                                           SUBJECTIVE STATEMENT: ***  PERTINENT HISTORY:  ***  PAIN:  Are you having pain? {OPRCPAIN:27236}  PRECAUTIONS: {Therapy precautions:24002}  RED FLAGS: {PT Red Flags:29287}   WEIGHT BEARING RESTRICTIONS: {Yes ***/No:24003}  FALLS:  Has patient fallen in last 6 months? {fallsyesno:27318}  LIVING ENVIRONMENT: Lives with: {OPRC lives with:25569::lives with their family} Lives in: {Lives in:25570} Stairs: {opstairs:27293} Has following equipment at home: {Assistive devices:23999}  OCCUPATION: ***  PLOF: {PLOF:24004}  PATIENT GOALS: ***  NEXT MD VISIT: ***  OBJECTIVE:  Note: Objective measures were completed at Evaluation unless otherwise noted.  DIAGNOSTIC FINDINGS:  ***  PATIENT SURVEYS:  {rehab surveys:24030}  COGNITION: Overall cognitive status: {cognition:24006}     SENSATION: {sensation:27233}  MUSCLE LENGTH: Hamstrings: Right *** deg; Left *** deg Debby test: Right *** deg; Left *** deg  POSTURE: {posture:25561}  PALPATION: ***  LUMBAR ROM:   AROM eval  Flexion   Extension   Right lateral flexion   Left lateral flexion   Right rotation   Left rotation    (Blank rows = not tested)  LOWER EXTREMITY ROM:     {AROM/PROM:27142}  Right eval Left eval  Hip flexion    Hip extension    Hip abduction    Hip adduction    Hip internal  rotation    Hip external rotation    Knee flexion    Knee extension    Ankle dorsiflexion    Ankle plantarflexion    Ankle inversion    Ankle eversion     (Blank rows = not tested)  LOWER EXTREMITY MMT:    MMT Right eval Left eval  Hip flexion    Hip extension    Hip abduction    Hip adduction    Hip internal rotation    Hip external rotation    Knee flexion    Knee extension    Ankle dorsiflexion    Ankle plantarflexion    Ankle inversion    Ankle eversion     (Blank rows = not tested)  LUMBAR SPECIAL TESTS:  {lumbar special test:25242}  FUNCTIONAL TESTS:  {Functional tests:24029}  GAIT: Distance walked: *** Assistive device utilized: {Assistive devices:23999} Level of assistance: {Levels of assistance:24026} Comments: ***  TREATMENT DATE: ***                                                                                                                                 PATIENT EDUCATION:  Education details: *** Person educated: {Person educated:25204} Education method: {Education Method:25205} Education comprehension: {Education Comprehension:25206}  HOME EXERCISE PROGRAM: ***  ASSESSMENT:  CLINICAL IMPRESSION: Patient is a *** y.o. *** who was seen today for physical therapy evaluation and treatment for ***.   OBJECTIVE IMPAIRMENTS: {opptimpairments:25111}.   ACTIVITY LIMITATIONS: {activitylimitations:27494}  PARTICIPATION LIMITATIONS: {participationrestrictions:25113}  PERSONAL FACTORS: {Personal factors:25162} are also affecting patient's functional outcome.   REHAB POTENTIAL: {rehabpotential:25112}  CLINICAL DECISION MAKING: {clinical decision making:25114}  EVALUATION COMPLEXITY: {Evaluation complexity:25115}   GOALS: Goals reviewed with patient? {yes/no:20286}  SHORT TERM  GOALS: Target date: ***  *** Baseline: Goal status: INITIAL  2.  *** Baseline:  Goal status: INITIAL  3.  *** Baseline:  Goal status: INITIAL  4.   *** Baseline:  Goal status: INITIAL  5.  *** Baseline:  Goal status: INITIAL  6.  *** Baseline:  Goal status: INITIAL  LONG TERM GOALS: Target date: ***  *** Baseline:  Goal status: INITIAL  2.  *** Baseline:  Goal status: INITIAL  3.  *** Baseline:  Goal status: INITIAL  4.  *** Baseline:  Goal status: INITIAL  5.  *** Baseline:  Goal status: INITIAL  6.  *** Baseline:  Goal status: INITIAL  PLAN:  PT FREQUENCY: {rehab frequency:25116}  PT DURATION: {rehab duration:25117}  PLANNED INTERVENTIONS: {rehab planned interventions:25118::97110-Therapeutic exercises,97530- Therapeutic [phone]- Neuromuscular re-education,97535- Self Rjmz,02859- Manual therapy,Patient/Family education}.  PLAN FOR NEXT SESSION: ***   Stefani Baik P Nasia Cannan, PT 03/28/2024, 10:35 AM  "

## 2024-03-28 NOTE — Therapy (Signed)
 " OUTPATIENT PHYSICAL THERAPY RE-EVALUATION  EVALUATION OF THORACOLUMBAR SPINE   Patient Name: Stacy Preston MRN: [id number] DOB:07-09-1960, 64 y.o., female Today's Date: 03/28/2024  END OF SESSION:  PT End of Session - 03/28/24 1505     Visit Number 5    Number of Visits 22    Date for Recertification  05/25/24    Authorization Type cigna    PT Start Time 1015    PT Stop Time 1100    PT Time Calculation (min) 45 min    Activity Tolerance Patient tolerated treatment well         Past Medical History:  Diagnosis Date   Allergy    History of basal cell carcinoma 01/09/2016   Hypertension    Infertility, female    Post-operative nausea and vomiting    Prolactinoma (HCC)    Vitamin D  deficiency 01/09/2016   Past Surgical History:  Procedure Laterality Date   BREAST BIOPSY Right 12/29/2020   COLONOSCOPY  2013, 2017   [place]   INSERTION OF MESH N/A 07/21/2022   Procedure: INSERTION OF MESH;  Surgeon: Curvin Deward MOULD, MD;  Location: [facility];  Service: General;  Laterality: N/A;   ivf     LAPAROSCOPY     for endometriosis   PLANTAR FASCIA RELEASE  05/2023   PLANTAR FASCIA SURGERY     UMBILICAL HERNIA REPAIR N/A 07/21/2022   Procedure: HERNIA REPAIR UMBILICAL ADULT WITH POSSIBLE MESH;  Surgeon: Curvin Deward MOULD, MD;  Location: [facility];  Service: General;  Laterality: N/A;   Patient Active Problem List   Diagnosis Date Noted   Hair loss 10/24/2023   Primary osteoarthritis of right knee 12/09/2022   Abnormal weight gain 06/03/2022   Umbilical hernia without obstruction and without gangrene 06/03/2022   Left foot pain 11/02/2021   Family history of early CAD 11/02/2021   Irritable bowel syndrome with constipation 09/08/2021   Acute medial meniscal tear, sequela 10/07/2020   Lumbar radiculopathy 05/20/2020   Chronic anterior pelvic pain 05/20/2020   Knee pain 05/20/2020   Cervical stenosis (uterine cervix) 05/05/2020   Pelvic and  perineal pain 04/24/2020   Cervical high risk HPV (human papillomavirus) test positive 01/08/2020   Well adult exam 11/28/2019   Elevated blood-pressure reading without diagnosis of hypertension 12/28/2018   Tinnitus 11/27/2018   GAD (generalized anxiety disorder) 12/21/2016   Atrophic vaginitis 02/27/2016   Vitamin D  deficiency 01/09/2016   History of prolactinoma 01/09/2016   History of basal cell carcinoma 01/09/2016   Cervical disc disorder with radiculopathy of cervical region 01/09/2016   Elevated C-reactive protein (CRP) 01/09/2016    PCP: Alvia Bring, DO  REFERRING PROVIDER: Joya Asberry, DPM  REFERRING DIAG: M72.2 (ICD-10-CM) - Plantar fasciitis, left  THERAPY DIAG:  Pain in left foot  Muscle weakness (generalized)  Difficulty in walking, not elsewhere classified  Other low back pain  Radiculopathy, lumbar region  Rationale for Evaluation and Treatment: Rehabilitation  ONSET DATE: April 2025  SUBJECTIVE:  Per eval: Pt states she has had ongoing BIL foot pain for years - prior plantar fascia surgeries in 2010s, has had PRP injections. States she had plantar fascia surgery in April of this year which helped her initial heel pain, but states as she was recovering began to develop worsening pain in medial arch, described as numbness and pulling. Had cortisone shot Dec 5th which she states was very helpful. Denies any radiation of pain, no noticeable swelling. Reports numbness to  light touch since surgery.  She denies surgical restrictions at this point. States she also has hx of R meniscal issues and back pain that shoots into R knee.   SUBJECTIVE STATEMENT: Patient reports she continues to have pulling sensation along incision and numbness but minimal pain.  PERTINENT HISTORY: hx basal carcinoma, GAD, IBS, hx cervical and lumbar radiculopathy   PAIN:  Are you having pain: none at rest, unrated discomfort with walking/standing L heel  Per eval:   Location/description: L medial arch, pulling/tension/numbness Best-worst over past week: 0-10/10  - aggravating factors: walking, especially barefoot, prolonged standing - Easing factors: footwear    PRECAUTIONS: cancer hx  RED FLAGS: None   WEIGHT BEARING RESTRICTIONS: No  FALLS:  Has patient fallen in last 6 months? Yes. 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 She reports onset of LBP 02/21/20 after driving for several hours. She has had pain in the LB with radicular symptoms on an intermittent basis but symptoms have not fully resolved. Current flare up of back and R LE pain started after patient had L plantar fasciitis surgery 4/25. She was placed in a walking boot. Back and R LE pain increased significantly and patient discontinued use of the boot. Pain has persisted in the central and primarily R lateral buttock with numbness into the R LE. Numbness lasts 5-15 min and occurs when she is standing or walking and resolves when patient is sitting. She has received chiropractic care intermittently since 2025. She is currently receiving magnetic therapy which patient has had success with in the past.   PERTINENT HISTORY:  Miscarriage 6/99; pelvic pain 4/08; hip contusion from fall 1/14; back strain 9/15; fx Lt wrist/sprained Lt ankle  1/16; neck and back pain 6/17; fx Lt 5th toe 8/20; fx with bulging disc/nerve pain 4/22; PT for LBP/knee and LE pain 4/22; pelvic PT 6/22 x 2 visits; torn meniscus Rt knee 6/22 w/ injection; MRI Lt hip labral  tear with cyst 6/22; lumbar ESI 12/12; hysteroscopy, biopsy, D&C, laparoscopy 03/13/21 cervical dysfunction; GI problems; HTN; plantar fasciitis bilat feet; laproscopies 3-5 times; plantar fasciitis surgery 4/25  PAIN:  PAIN:  Are you having pain? Yes: NPRS scale: 5/10; best 1-2/10; worst 10/10 Pain location: central LB; primarily to R Pain description: at times burning; aching; tingling  Aggravating factors: standing; walking  Relieving factors: sitting     PATIENT GOALS: improve back pain and get rid of R leg tingling   OBJECTIVE:  Note: Objective measures were completed at Evaluation unless otherwise noted.  DIAGNOSTIC FINDINGS:  MRI 11/25: 2 mm retrolisthesis L5/S1; conus medullaris normal position; disc bulge L2/3; L3/4; L4/5; L5/S1 of varying degrees with canal stenosis and crowding of cauda equina nerve roots; facet hypertrophy  PATIENT SURVEYS:  (To be completed at subsequent PT appointments   SENSATION: Intermittent tingling R LE described as the entire leg; intermittent L UE tingling and numbness   MUSCLE LENGTH: Hamstrings: Right 75 deg; Left 80 deg Thomas test: Right (-) 8 deg; Left (-) 5 deg  POSTURE: rounded shoulders, forward head, increased thoracic kyphosis, anterior pelvic tilt, and flexed trunk   PALPATION: Significant tightness R/L psoas and hip flexors; lower lumbar spine; lateral sacral area R > L; R posterior hip through the glut max/med; piriformis   LUMBAR ROM:   AROM eval  Flexion 70% pain/tingling   Extension 50%  Right lateral flexion 60% R buttock pain  Left lateral flexion 70% LBP  Right rotation 50% R buttock pain  Left rotation 60% LBP    (Blank rows = not tested)  LOWER EXTREMITY ROM:     Active  Right eval Left eval  Hip flexion     Hip extension (-) 8 (-)5  Hip abduction    Hip adduction    Hip internal rotation 20 30  Hip external rotation 12 18  Knee flexion 116 126  Knee extension (-)7 0  Ankle dorsiflexion    Ankle plantarflexion    Ankle inversion    Ankle eversion     (Blank rows = not tested)  LOWER EXTREMITY MMT:    MMT Right eval Left eval  Hip flexion 5- 5-  Hip extension 4 5  Hip abduction 4 5  Hip adduction    Hip internal rotation    Hip external rotation    Knee flexion    Knee extension    Ankle dorsiflexion    Ankle plantarflexion    Ankle inversion    Ankle eversion     (Blank rows = not tested)  LUMBAR SPECIAL TESTS:  Straight leg raise test: Negative, Slump test: Positive increased R LE tingling, and Single leg stance - R 3 sec; L 5 sec   FUNCTIONAL TESTS:  Bridging - tinging in LE 43 sec; HS cramp 58.47 sec   GAIT: Distance walked: 40 feet  Assistive device utilized: None Level of assistance: Complete Independence Comments: reduced heel to toe pattern with gait; flexed trunk   TREATMENT DATE: POC; evaluation findings; exercises  PATIENT EDUCATION:  Education details: POC; HEP  Person educated: Patient Education method: Programmer, Multimedia, Demonstration, Actor cues, Verbal cues, and Handouts Education comprehension: verbalized understanding, returned demonstration, verbal cues required, tactile cues required, and needs further education  HOME EXERCISE PROGRAM: Access Code: [email or web address] URL: [email or web address] Date: 03/28/2024 Prepared by: Carlo Lorson  Exercises - Hip Flexor Stretch at [facility] of Bed  - 2 x daily - 7 x weekly - 1 sets - 3 reps - 30 sec  hold - Supine Transversus Abdominis Bracing with Pelvic Floor Contraction  - 2 x daily - 7 x weekly - 1 sets - 10 reps - 10sec  hold   OBJECTIVE:  Note: Objective measures were  completed at Evaluation unless otherwise noted.  DIAGNOSTIC FINDINGS:  01/16/24 L heel MRI: IMPRESSION: 1. Moderate plantar fasciitis of the central cord of the plantar fascia at towards the calcaneal insertion without a tear. 2. Moderate tendinosis of the peroneus brevis with a longitudinal split tear distal to the lateral malleolus.    PATIENT SURVEYS:  LEFS: 40/80   COGNITION: Overall cognitive status: Within functional limits for tasks assessed     SENSATION: Reports light touch deficits since surgery   EDEMA:  No visible edema   PALPATION: Tightness to plantar fascia LLE but no pain about foot/ankle w/ palpation  LOWER EXTREMITY ROM:  ROM Right eval Left eval  Knee flexion    Knee extension    Ankle dorsiflexion 8 deg 5 deg  Ankle plantarflexion    Ankle inversion 25 35 deg *  Ankle eversion 35 30 deg *   (Blank rows = not tested) (Key: WFL = within functional limits not formally assessed, * = concordant pain, s = stiffness/stretching sensation, NT = not tested) Comments: concordant pain provoked with DF/eversion/toe extension  LOWER EXTREMITY MMT:  MMT Right eval Left eval  Knee flexion    Knee extension    Ankle dorsiflexion 5 4+  Ankle plantarflexion    Ankle inversion 5 4+  Ankle eversion 5 5   (Blank rows = not tested) (Key: WFL = within functional limits not formally assessed, * = concordant pain, s = stiffness/stretching sensation, NT = not tested) Comment:     FUNCTIONAL TESTS:  5xSTS: 8.49sec w UE support, barefoot, painful Single leg stance (wearing shoes): 2 sec RLE (knee), LLE 30sec but provokes arch pain, instability noted  GAIT: Distance walked: within clinic Assistive device utilized: None Level of assistance: Complete Independence Comments: reduced toe off noted with barefoot walking on LLE                                                                                                                                  TREATMENT:  [facility] Adult PT Treatment:  DATE: 03/07/2024 Manual Therapy: IASTM medial arch, plantar surface, medial ankle Neuromuscular re-ed: Standing: Towel scrunch in standing --> gathering towel in Arch lifting Alternating heel/toe raise (Lt) Seated: Resisted ankle PF + green TB Resisted ankle DF + green TB Resisted ankle EVER/INV + yellow TB Therapeutic Activity: Seated ankle ever/inver (ball b/w knees for stability) Weight shifting with push-off from great toe (Lt) Walking with focus on heel strike & great toe push off   [facility] Adult PT Treatment:                                                DATE: 02/28/2024 Neuromuscular re-ed: Standing foot doming Alternating heel/toe raises --> tactile cues for ankle stability with heel raise  Gyro foot circle --> standing Ankle pronation mobility standing Ankle supination AROM on rocker BAPS board --> ankle EVER/INVER & circles CW/CCW Mini squat with great toe flexion isometric + red TB under toe (difficult) Therapeutic Activity: Towel scrunch with hold Towel scrunch in & push out Ankle ever/inv AROM Standing toe yoga Rolling plantar surface with tennis ball    [facility] Adult PT Treatment:                                                DATE: 02/22/24 Therapeutic Exercise: Ankle inversion/eversion within comfortable range x25 total  Seated LLE heel raise 2x8 cues for alignment Seated inversion isometric 2x10 cues for submax, alignment HEP update + education/handout  Neuromuscular re-ed: Toe scrunches 2x12 cues for tripod Toe extension 2x12 cues for tripod Isolated hallux ext 2x12 cues for sequencing Toe yoga x12 - discontinued d/t difficulty sequencing  Self Care: Education/discussion re: rationale for interventions, relevant anatomy/physiology as pertains to symptom behavior and activity in session (use of visual aids)    PATIENT EDUCATION:  Education details: Updated HEP   Person educated: Patient Education method: Explanation, Demonstration, Tactile cues, Verbal cues Education comprehension: verbalized understanding, returned demonstration, verbal cues required, tactile cues required, and needs further education     HOME EXERCISE PROGRAM: Access Code: [email or web address] URL: [email or web address] Date: 03/07/2024 Prepared by: Lamarr Price  Program Notes - scrunching toes, lifting toes  Exercises - Towel Scrunches  - 2-3 x daily - 7 x weekly - 1 sets - 6-8 reps - Seated Great Toe Extension  - 2-3 x daily - 7 x weekly - 1 sets - 8-10 reps - Seated Heel Raise  - 2-3 x daily - 7 x weekly - 1 sets - 8-10 reps - Long Sitting Ankle Eversion with Resistance  - 1 x daily - 7 x weekly - 3 sets - 10 reps - Long Sitting Ankle Plantar Flexion with Resistance  - 1 x daily - 7 x weekly - 3 sets - 10 reps - Long Sitting Ankle Inversion with Resistance  - 1 x daily - 7 x weekly - 3 sets - 10 reps - Long Sitting Ankle Dorsiflexion with Anchored Resistance  - 1 x daily - 7 x weekly - 3 sets - 10 reps  ASSESSMENT:  CLINICAL IMPRESSION: 03/28/24: Lumbar evaluation - patient present with recurrent long standing back pain with radicular symptoms. Patient presents with abnormal standing posture with anterior pelvic tilt; limited and painful lumbar ROM; decreased core, trunk, LE  strength; pain with functional activities of standing and walking; limited functional endurance; pain and muscular tightness to palpation especially through psoas and hip flexors bilat and R > L lumbar and R posterior hip musculature. She has a history of pelvic dysfunction treated with therapy; multiple surgeries; torn R medial meniscus; bilat plantar fasciitis with surgery L plantar fasciitis 4/25. He job involves sitting at desk and computer for extended hours each day. MRI shows disc bulges through the lumbar spine as well as stenosis of lumbar spine and facet hypertrophy. Functional bias is  flexion based. Patient will benefit from trial of PT to address muscular tightness and core/LE weakness.      Progressed to resisted ankle strengthening exercises in all directions; patient able to complete with no exacerbation of pain or discomfort. Weight shifting with great toe push off incorporated with gait mechanics; cueing heel strike improved Lt ankle stability with roll through on stepping transfer. Limited mobility noted on 5th digit of Lt foot during towel scrunch exercise.    Per eval: Patient is a 64 y.o. woman who was seen today for physical therapy evaluation and treatment for L foot pain ongoing since April 2025. Endorses difficulty w/ WB, particularly when barefoot. On exam she demonstrates concordant limitations in strength and foot intrinsic activation. Able to maintain SLS for 30 sec on affected limb but does elicit pain even in shoes, moderate multidirectional sway. No adverse events, tolerates session well overall. Recommend trial of skilled PT to address aforementioned deficits with aim of improving functional tolerance and reducing pain with typical activities. Pt departs today's session in no acute distress, all voiced concerns/questions addressed appropriately from PT perspective.    OBJECTIVE IMPAIRMENTS: decreased balance, decreased endurance, decreased mobility, difficulty walking, decreased ROM, decreased strength, impaired perceived functional ability, and pain.   ACTIVITY LIMITATIONS: standing, squatting, and locomotion level  PARTICIPATION LIMITATIONS: meal prep, cleaning, and laundry  PERSONAL FACTORS: Time since onset of injury/illness/exacerbation and 3+ comorbidities: hx basal carcinoma, GAD, IBS, hx cervical and lumbar radiculopathy  are also affecting patient's functional outcome.   REHAB POTENTIAL: Fair given chronicity and surgical history   CLINICAL DECISION MAKING: Stable/uncomplicated  EVALUATION COMPLEXITY: Low   GOALS:   SHORT TERM GOALS:  Target date: 03/19/2024  Pt will demonstrate appropriate understanding and performance of initially prescribed HEP in order to facilitate improved independence with management of symptoms.  Baseline: HEP established  Goal status: INITIAL   2. Pt will report at least 25% improvement in overall pain levels over past week in order to facilitate improved tolerance to typical daily activities.   Baseline: 0-10/10  Goal status: INITIAL    LONG TERM GOALS: Target date: 04/16/2024  Pt will score 60/80 or greater on LEFS in order to demonstrate improved perception of function due to symptoms (MCID 9 pts) Baseline: 40/80 Goal status: INITIAL  2.  Pt will demonstrate grossly symmetrical ankle MMT in tested groups in order to indicate improved functional strength. Baseline: see MMT chart above Goal status: INITIAL  3.  Pt will report ability to walk for >46min with less than 3 pt increase in pain on NPS for improved community navigation. Baseline: limiting community ambulation at this time, states she would like to be able to walk for up to 30 min at a time without pain Goal status: INITIAL  4.  Pt will report at least 50% decrease in overall pain levels in past week in order to facilitate improved tolerance to basic ADLs/mobility.   Baseline: 0-10/10  Goal status: INITIAL    5. Pt will be able to perform SLS on LLE for 30sec with </= 2/10 pain on NPS and no more than min sway in order to facilitate improved postural stability and reduced fall risk  Baseline: 30sec w/ pain and moderate sway  Goal status: INITIAL   GOALS: LUMBAR SPINE Goals reviewed with patient? Yes  SHORT TERM GOALS: Target date: 04/25/2024   Independent with initial HEP Baseline: Goal status: INITIAL  2.  Increase standing time to 10 min without onset of R LE tingling  Baseline: Goal status: INITIAL   LONG TERM GOALS: Target date: 05/25/2024    Independent with advance HEP Baseline: Goal status: INITIAL  2.   Improve core strength with patient maintaining bridge for 60 sec with minimal to no LE tingling and good control  Baseline: Goal status: INITIAL  3. Increase LE strength to 5/5 throughout  Baseline: Goal status: INIITAL  4.  Decrease R LE radicular symptoms by 50-70%  Baseline: Goal status: INITIAL  5.  Increased walking time to 15-20 min without LE symptoms  Baseline: Goal status: INITIAL    PLAN:  PT FREQUENCY: 2x/week  PT DURATION: 8 weeks  PLANNED INTERVENTIONS: [id number]- PT Re-evaluation, 97750- Physical Performance Testing, 97110-Therapeutic exercises, 97530- Therapeutic activity, [id number]- Neuromuscular re-education, 97535- Self Care, [id number]- Manual therapy, [id number]- Gait training, [phone]- Electrical stimulation (unattended), 20560 (1-2 muscles), 20561 (3+ muscles)- Dry Needling, Patient/Family education, Balance training, Stair training, Taping, Joint mobilization, Cryotherapy, and Moist heat  PLAN FOR NEXT SESSION: Review/update HEP PRN. Work on [employer] exercises as appropriate with emphasis on foot intrinsic/extrinsic activation, calf strengthening. Symptom modification strategies as indicated/appropriate. Core strengthening and stabilization; functional strengthening; standing/walking; manual work including release of psoas and self release using myofacial ball; education and home instruction in spine care and ergonomic modifications.    Winner Valeriano P. Ina PT, MPH 03/28/24 9:47 PM  "

## 2024-03-29 ENCOUNTER — Ambulatory Visit: Admitting: Podiatry

## 2024-03-29 ENCOUNTER — Encounter: Payer: Self-pay | Admitting: Podiatry

## 2024-03-29 ENCOUNTER — Telehealth: Payer: Self-pay | Admitting: *Deleted

## 2024-03-29 DIAGNOSIS — M722 Plantar fascial fibromatosis: Secondary | ICD-10-CM

## 2024-03-29 NOTE — Progress Notes (Signed)
"  °  Subjective:  Patient ID: Stacy Preston, female    DOB: 05-17-1960,   MRN: [id number]  Chief Complaint  Patient presents with   Plantar Fasciitis    The pain is better but the tightness and the numbness is still there.    64 y.o. female presents for follow-up of left heel pain relates she is overall doing better.  She has been in physical therapy and this seems to be helping.  She still getting some numbness and tightness in the area.  She is also working on her back and physical therapy as well..  Denies any other pedal complaints. Denies n/v/f/c.   Past Medical History:  Diagnosis Date   Allergy    History of basal cell carcinoma 01/09/2016   Hypertension    Infertility, female    Post-operative nausea and vomiting    Prolactinoma (HCC)    Vitamin D  deficiency 01/09/2016    Objective:  Physical Exam: Vascular: DP/PT pulses 2/4 bilateral. CFT <3 seconds. Normal hair growth on digits. No edema.  Skin. No lacerations or abrasions bilateral feet.  Musculoskeletal: MMT 5/5 bilateral lower extremities in DF, PF, Inversion and Eversion. Deceased ROM in DF of ankle joint.  Minimal tenderness noted to the arch of left foot.  No major palpable mass noted today.  There is tenderness noted in the central cord of the plantar fascia. Neurological: Sensation intact to light touch.   IMPRESSION: 1. Moderate plantar fasciitis of the central cord of the plantar fascia at towards the calcaneal insertion without a tear. 2. Moderate tendinosis of the peroneus brevis with a longitudinal split tear distal to the lateral malleolus.  Assessment:   1. Plantar fasciitis, left        Plan:  Patient was evaluated and treated and all questions answered. Discussed the diagnosis of plantar fasciitis and fibroma and treatment options with patient. Discussed offloading of the area and proper shoe wear. Continue stretching and working with physical therapy on her back. Injection deferred  today Discussed likely some of the pain and nerve related pain could be related to her back and hopefully with time this will improve with physical therapy. Discussed if continued problems can consider surgical reoperation if needed. Patient to follow-up as needed    Asberry Failing, DPM    "

## 2024-03-29 NOTE — Telephone Encounter (Signed)
 I called and left her a message stating that I was calling to see if she wanted to come for her appointment earlier because we had some cancellations this afternoon.  I asked her to come at whatever time is convenient for her, no need for a return call.

## 2024-04-02 ENCOUNTER — Ambulatory Visit: Admitting: Rehabilitative and Restorative Service Providers"

## 2024-04-02 ENCOUNTER — Ambulatory Visit: Admitting: Behavioral Health

## 2024-04-06 ENCOUNTER — Ambulatory Visit: Admitting: Rehabilitative and Restorative Service Providers"
